# Patient Record
Sex: Male | Born: 1935 | Hispanic: Yes | Smoking: Former smoker
Health system: Southern US, Community
[De-identification: ages and names within clinical notes are randomized; demographics above are authoritative.]

## PROBLEM LIST (undated history)

## (undated) DIAGNOSIS — S88919A Complete traumatic amputation of unspecified lower leg, level unspecified, initial encounter: Secondary | ICD-10-CM

## (undated) DIAGNOSIS — I2510 Atherosclerotic heart disease of native coronary artery without angina pectoris: Secondary | ICD-10-CM

## (undated) DIAGNOSIS — I472 Ventricular tachycardia, unspecified: Secondary | ICD-10-CM

## (undated) DIAGNOSIS — J449 Chronic obstructive pulmonary disease, unspecified: Secondary | ICD-10-CM

## (undated) DIAGNOSIS — S68119A Complete traumatic metacarpophalangeal amputation of unspecified finger, initial encounter: Secondary | ICD-10-CM

## (undated) DIAGNOSIS — E785 Hyperlipidemia, unspecified: Secondary | ICD-10-CM

## (undated) DIAGNOSIS — Z95810 Presence of automatic (implantable) cardiac defibrillator: Secondary | ICD-10-CM

## (undated) DIAGNOSIS — R569 Unspecified convulsions: Secondary | ICD-10-CM

## (undated) DIAGNOSIS — E119 Type 2 diabetes mellitus without complications: Secondary | ICD-10-CM

## (undated) DIAGNOSIS — S065X9A Traumatic subdural hemorrhage with loss of consciousness of unspecified duration, initial encounter: Secondary | ICD-10-CM

## (undated) DIAGNOSIS — I739 Peripheral vascular disease, unspecified: Secondary | ICD-10-CM

## (undated) DIAGNOSIS — S065XAA Traumatic subdural hemorrhage with loss of consciousness status unknown, initial encounter: Secondary | ICD-10-CM

## (undated) DIAGNOSIS — I10 Essential (primary) hypertension: Secondary | ICD-10-CM

## (undated) DIAGNOSIS — I639 Cerebral infarction, unspecified: Secondary | ICD-10-CM

## (undated) DIAGNOSIS — Z9289 Personal history of other medical treatment: Secondary | ICD-10-CM

## (undated) DIAGNOSIS — I82409 Acute embolism and thrombosis of unspecified deep veins of unspecified lower extremity: Secondary | ICD-10-CM

## (undated) DIAGNOSIS — I5022 Chronic systolic (congestive) heart failure: Secondary | ICD-10-CM

## (undated) HISTORY — PX: LEG AMPUTATION BELOW KNEE: SHX694

## (undated) HISTORY — PX: BALLOON ANGIOPLASTY, ARTERY: SHX564

## (undated) HISTORY — PX: LEG AMPUTATION ABOVE KNEE: SHX117

## (undated) HISTORY — PX: CAROTID ENDARTERECTOMY: SUR193

## (undated) HISTORY — PX: EYE SURGERY: SHX253

---

## 1996-09-05 DIAGNOSIS — I639 Cerebral infarction, unspecified: Secondary | ICD-10-CM

## 1996-09-05 HISTORY — DX: Cerebral infarction, unspecified: I63.9

## 2002-09-05 HISTORY — PX: CORONARY ARTERY BYPASS GRAFT: SHX141

## 2005-09-05 HISTORY — PX: CARDIAC DEFIBRILLATOR PLACEMENT: SHX171

## 2010-08-01 ENCOUNTER — Ambulatory Visit: Payer: Self-pay | Admitting: Internal Medicine

## 2010-08-01 ENCOUNTER — Inpatient Hospital Stay (HOSPITAL_COMMUNITY): Admission: EM | Admit: 2010-08-01 | Discharge: 2010-08-04 | Payer: Self-pay | Admitting: Internal Medicine

## 2010-08-01 ENCOUNTER — Encounter: Payer: Self-pay | Admitting: Emergency Medicine

## 2010-08-01 ENCOUNTER — Ambulatory Visit: Payer: Self-pay | Admitting: Cardiology

## 2010-08-02 ENCOUNTER — Encounter: Payer: Self-pay | Admitting: Internal Medicine

## 2010-08-03 ENCOUNTER — Ambulatory Visit: Payer: Self-pay | Admitting: Physical Medicine & Rehabilitation

## 2010-08-03 ENCOUNTER — Encounter (INDEPENDENT_AMBULATORY_CARE_PROVIDER_SITE_OTHER): Payer: Self-pay | Admitting: Internal Medicine

## 2010-08-04 ENCOUNTER — Inpatient Hospital Stay (HOSPITAL_COMMUNITY)
Admission: RE | Admit: 2010-08-04 | Discharge: 2010-08-13 | Payer: Self-pay | Attending: Physical Medicine & Rehabilitation | Admitting: Physical Medicine & Rehabilitation

## 2010-09-21 ENCOUNTER — Encounter
Admission: RE | Admit: 2010-09-21 | Discharge: 2010-10-05 | Payer: Self-pay | Source: Home / Self Care | Attending: Physical Medicine & Rehabilitation | Admitting: Physical Medicine & Rehabilitation

## 2010-09-24 ENCOUNTER — Ambulatory Visit: Admit: 2010-09-24 | Payer: Self-pay | Admitting: Physical Medicine & Rehabilitation

## 2010-09-27 ENCOUNTER — Emergency Department (HOSPITAL_COMMUNITY)
Admission: EM | Admit: 2010-09-27 | Discharge: 2010-09-27 | Payer: Self-pay | Source: Home / Self Care | Admitting: Emergency Medicine

## 2010-09-28 LAB — CBC
Hemoglobin: 12.6 g/dL — ABNORMAL LOW (ref 13.0–17.0)
MCHC: 33.5 g/dL (ref 30.0–36.0)
RBC: 3.89 MIL/uL — ABNORMAL LOW (ref 4.22–5.81)
WBC: 10.7 10*3/uL — ABNORMAL HIGH (ref 4.0–10.5)

## 2010-09-28 LAB — DIFFERENTIAL
Basophils Absolute: 0 10*3/uL (ref 0.0–0.1)
Basophils Relative: 0 % (ref 0–1)
Monocytes Absolute: 1.1 10*3/uL — ABNORMAL HIGH (ref 0.1–1.0)
Neutro Abs: 7 10*3/uL (ref 1.7–7.7)
Neutrophils Relative %: 65 % (ref 43–77)

## 2010-09-28 LAB — DIGOXIN LEVEL: Digoxin Level: 0.5 ng/mL — ABNORMAL LOW (ref 0.8–2.0)

## 2010-09-28 LAB — GLUCOSE, CAPILLARY: Glucose-Capillary: 82 mg/dL (ref 70–99)

## 2010-09-28 LAB — POCT I-STAT, CHEM 8
HCT: 39 % (ref 39.0–52.0)
Hemoglobin: 13.3 g/dL (ref 13.0–17.0)
Potassium: 3.9 mEq/L (ref 3.5–5.1)
Sodium: 141 mEq/L (ref 135–145)
TCO2: 24 mmol/L (ref 0–100)

## 2010-09-28 LAB — SEDIMENTATION RATE: Sed Rate: 29 mm/hr — ABNORMAL HIGH (ref 0–16)

## 2010-10-23 ENCOUNTER — Emergency Department (HOSPITAL_COMMUNITY): Payer: Medicare Other

## 2010-10-23 ENCOUNTER — Emergency Department (HOSPITAL_COMMUNITY)
Admission: EM | Admit: 2010-10-23 | Discharge: 2010-10-23 | Disposition: A | Discharge status: Home / Self Care | Payer: Medicare Other | Attending: Emergency Medicine | Admitting: Emergency Medicine

## 2010-10-23 DIAGNOSIS — R0602 Shortness of breath: Secondary | ICD-10-CM | POA: Insufficient documentation

## 2010-10-23 DIAGNOSIS — I517 Cardiomegaly: Secondary | ICD-10-CM | POA: Insufficient documentation

## 2010-10-23 DIAGNOSIS — R0989 Other specified symptoms and signs involving the circulatory and respiratory systems: Secondary | ICD-10-CM | POA: Insufficient documentation

## 2010-10-23 DIAGNOSIS — J40 Bronchitis, not specified as acute or chronic: Secondary | ICD-10-CM | POA: Insufficient documentation

## 2010-10-23 DIAGNOSIS — R05 Cough: Secondary | ICD-10-CM | POA: Insufficient documentation

## 2010-10-23 DIAGNOSIS — J449 Chronic obstructive pulmonary disease, unspecified: Secondary | ICD-10-CM | POA: Insufficient documentation

## 2010-10-23 DIAGNOSIS — I447 Left bundle-branch block, unspecified: Secondary | ICD-10-CM | POA: Insufficient documentation

## 2010-10-23 DIAGNOSIS — R059 Cough, unspecified: Secondary | ICD-10-CM | POA: Insufficient documentation

## 2010-10-23 DIAGNOSIS — J4489 Other specified chronic obstructive pulmonary disease: Secondary | ICD-10-CM | POA: Insufficient documentation

## 2010-10-23 LAB — CBC
Hemoglobin: 13.2 g/dL (ref 13.0–17.0)
MCHC: 33.8 g/dL (ref 30.0–36.0)
RDW: 13 % (ref 11.5–15.5)

## 2010-10-23 LAB — DIFFERENTIAL
Basophils Absolute: 0 10*3/uL (ref 0.0–0.1)
Basophils Relative: 0 % (ref 0–1)
Monocytes Absolute: 1.4 10*3/uL — ABNORMAL HIGH (ref 0.1–1.0)
Neutro Abs: 10.2 10*3/uL — ABNORMAL HIGH (ref 1.7–7.7)

## 2010-10-23 LAB — BASIC METABOLIC PANEL
Calcium: 9.5 mg/dL (ref 8.4–10.5)
GFR calc Af Amer: >60 mL/min (ref >60)
GFR calc non Af Amer: >60 mL/min (ref >60)
Sodium: 140 mEq/L (ref 135–145)

## 2010-10-26 ENCOUNTER — Inpatient Hospital Stay (HOSPITAL_COMMUNITY)
Admission: EM | Admit: 2010-10-26 | Discharge: 2010-11-02 | DRG: 194 | Disposition: A | Discharge status: Skilled Nursing Facility | Payer: Medicare Other | Attending: Internal Medicine | Admitting: Internal Medicine

## 2010-10-26 ENCOUNTER — Emergency Department (HOSPITAL_COMMUNITY): Payer: Medicare Other

## 2010-10-26 DIAGNOSIS — I2589 Other forms of chronic ischemic heart disease: Secondary | ICD-10-CM | POA: Diagnosis present

## 2010-10-26 DIAGNOSIS — S68118A Complete traumatic metacarpophalangeal amputation of other finger, initial encounter: Secondary | ICD-10-CM

## 2010-10-26 DIAGNOSIS — Z79899 Other long term (current) drug therapy: Secondary | ICD-10-CM

## 2010-10-26 DIAGNOSIS — I2510 Atherosclerotic heart disease of native coronary artery without angina pectoris: Secondary | ICD-10-CM | POA: Diagnosis present

## 2010-10-26 DIAGNOSIS — Z8673 Personal history of transient ischemic attack (TIA), and cerebral infarction without residual deficits: Secondary | ICD-10-CM

## 2010-10-26 DIAGNOSIS — L97409 Non-pressure chronic ulcer of unspecified heel and midfoot with unspecified severity: Secondary | ICD-10-CM | POA: Diagnosis present

## 2010-10-26 DIAGNOSIS — I70269 Atherosclerosis of native arteries of extremities with gangrene, unspecified extremity: Secondary | ICD-10-CM | POA: Diagnosis present

## 2010-10-26 DIAGNOSIS — J189 Pneumonia, unspecified organism: Principal | ICD-10-CM | POA: Diagnosis present

## 2010-10-26 DIAGNOSIS — I4891 Unspecified atrial fibrillation: Secondary | ICD-10-CM | POA: Diagnosis present

## 2010-10-26 DIAGNOSIS — E871 Hypo-osmolality and hyponatremia: Secondary | ICD-10-CM | POA: Diagnosis not present

## 2010-10-26 DIAGNOSIS — E1159 Type 2 diabetes mellitus with other circulatory complications: Secondary | ICD-10-CM | POA: Diagnosis present

## 2010-10-26 DIAGNOSIS — R627 Adult failure to thrive: Secondary | ICD-10-CM | POA: Diagnosis present

## 2010-10-26 DIAGNOSIS — S88119A Complete traumatic amputation at level between knee and ankle, unspecified lower leg, initial encounter: Secondary | ICD-10-CM

## 2010-10-26 DIAGNOSIS — Z7902 Long term (current) use of antithrombotics/antiplatelets: Secondary | ICD-10-CM

## 2010-10-26 DIAGNOSIS — I10 Essential (primary) hypertension: Secondary | ICD-10-CM | POA: Diagnosis present

## 2010-10-26 DIAGNOSIS — R5381 Other malaise: Secondary | ICD-10-CM | POA: Diagnosis present

## 2010-10-26 DIAGNOSIS — Z950 Presence of cardiac pacemaker: Secondary | ICD-10-CM

## 2010-10-26 DIAGNOSIS — I252 Old myocardial infarction: Secondary | ICD-10-CM

## 2010-10-26 DIAGNOSIS — Z951 Presence of aortocoronary bypass graft: Secondary | ICD-10-CM

## 2010-10-26 DIAGNOSIS — Z87820 Personal history of traumatic brain injury: Secondary | ICD-10-CM

## 2010-10-26 LAB — POCT CARDIAC MARKERS
CKMB, poc: 4.6 ng/mL (ref 1.0–8.0)
Myoglobin, poc: 217 ng/mL (ref 12–200)

## 2010-10-26 LAB — POCT I-STAT, CHEM 8
HCT: 39 % (ref 39.0–52.0)
Hemoglobin: 13.3 g/dL (ref 13.0–17.0)
Potassium: 4.3 mEq/L (ref 3.5–5.1)
Sodium: 139 mEq/L (ref 135–145)

## 2010-10-26 LAB — URINALYSIS, ROUTINE W REFLEX MICROSCOPIC
Hgb urine dipstick: NEGATIVE
Ketones, ur: NEGATIVE mg/dL
Protein, ur: NEGATIVE mg/dL
Urobilinogen, UA: 0.2 mg/dL (ref 0.0–1.0)

## 2010-10-26 LAB — CBC
HCT: 36.7 % — ABNORMAL LOW (ref 39.0–52.0)
MCHC: 33.2 g/dL (ref 30.0–36.0)
RDW: 12.9 % (ref 11.5–15.5)

## 2010-10-26 LAB — DIFFERENTIAL
Basophils Absolute: 0 10*3/uL (ref 0.0–0.1)
Basophils Relative: 0 % (ref 0–1)
Eosinophils Relative: 2 % (ref 0–5)
Monocytes Absolute: 1.8 10*3/uL — ABNORMAL HIGH (ref 0.1–1.0)

## 2010-10-27 LAB — GLUCOSE, CAPILLARY: Glucose-Capillary: 109 mg/dL — ABNORMAL HIGH (ref 70–99)

## 2010-10-28 ENCOUNTER — Inpatient Hospital Stay (HOSPITAL_COMMUNITY): Payer: Medicare Other

## 2010-10-28 LAB — GLUCOSE, CAPILLARY
Glucose-Capillary: 108 mg/dL — ABNORMAL HIGH (ref 70–99)
Glucose-Capillary: 117 mg/dL — ABNORMAL HIGH (ref 70–99)
Glucose-Capillary: 135 mg/dL — ABNORMAL HIGH (ref 70–99)
Glucose-Capillary: 150 mg/dL — ABNORMAL HIGH (ref 70–99)
Glucose-Capillary: 158 mg/dL — ABNORMAL HIGH (ref 70–99)

## 2010-10-28 LAB — MAGNESIUM: Magnesium: 1.9 mg/dL (ref 1.5–2.5)

## 2010-10-28 LAB — CBC
Hemoglobin: 11.9 g/dL — ABNORMAL LOW (ref 13.0–17.0)
MCH: 32.1 pg (ref 26.0–34.0)
MCV: 96.2 fL (ref 78.0–100.0)
Platelets: 314 10*3/uL (ref 150–400)
RBC: 3.71 MIL/uL — ABNORMAL LOW (ref 4.22–5.81)
WBC: 17.2 10*3/uL — ABNORMAL HIGH (ref 4.0–10.5)

## 2010-10-28 LAB — BASIC METABOLIC PANEL
BUN: 14 mg/dL (ref 6–23)
CO2: 27 mEq/L (ref 19–32)
Chloride: 102 mEq/L (ref 96–112)
Creatinine, Ser: 1.08 mg/dL (ref 0.4–1.5)
Potassium: 4.2 mEq/L (ref 3.5–5.1)

## 2010-10-28 LAB — DIGOXIN LEVEL: Digoxin Level: 0.4 ng/mL — ABNORMAL LOW (ref 0.8–2.0)

## 2010-10-29 ENCOUNTER — Inpatient Hospital Stay (HOSPITAL_COMMUNITY): Payer: Medicare Other

## 2010-10-29 ENCOUNTER — Encounter (HOSPITAL_COMMUNITY): Payer: Medicare Other

## 2010-10-29 LAB — CBC
MCH: 32.1 pg (ref 26.0–34.0)
MCHC: 34.1 g/dL (ref 30.0–36.0)
MCV: 94.1 fL (ref 78.0–100.0)
Platelets: 260 10*3/uL (ref 150–400)
RDW: 12.8 % (ref 11.5–15.5)

## 2010-10-29 LAB — GLUCOSE, CAPILLARY
Glucose-Capillary: 164 mg/dL — ABNORMAL HIGH (ref 70–99)
Glucose-Capillary: 120 mg/dL — ABNORMAL HIGH (ref 70–99)
Glucose-Capillary: 233 mg/dL — ABNORMAL HIGH (ref 70–99)

## 2010-10-29 LAB — COMPREHENSIVE METABOLIC PANEL
Albumin: 2.5 g/dL — ABNORMAL LOW (ref 3.5–5.2)
BUN: 17 mg/dL (ref 6–23)
Calcium: 8.6 mg/dL (ref 8.4–10.5)
Creatinine, Ser: 1.3 mg/dL (ref 0.4–1.5)
GFR calc Af Amer: >60 mL/min (ref >60)
Total Bilirubin: 0.6 mg/dL (ref 0.3–1.2)
Total Protein: 6.6 g/dL (ref 6.0–8.3)

## 2010-10-29 MED ORDER — TECHNETIUM TC 99M MEDRONATE IV KIT
25.0000 | PACK | Freq: Once | INTRAVENOUS | Status: AC | PRN
  Administered 2010-10-29: 27 via INTRAVENOUS

## 2010-10-30 LAB — CBC
HCT: 30.4 % — ABNORMAL LOW (ref 39.0–52.0)
Platelets: 254 10*3/uL (ref 150–400)
RBC: 3.2 MIL/uL — ABNORMAL LOW (ref 4.22–5.81)
RDW: 12.9 % (ref 11.5–15.5)

## 2010-10-30 LAB — GLUCOSE, CAPILLARY: Glucose-Capillary: 125 mg/dL — ABNORMAL HIGH (ref 70–99)

## 2010-10-30 LAB — BASIC METABOLIC PANEL
GFR calc non Af Amer: 58 mL/min — ABNORMAL LOW (ref >60)
Potassium: 4 mEq/L (ref 3.5–5.1)
Sodium: 136 mEq/L (ref 135–145)

## 2010-10-30 LAB — MAGNESIUM: Magnesium: 2.3 mg/dL (ref 1.5–2.5)

## 2010-10-31 LAB — GLUCOSE, CAPILLARY
Glucose-Capillary: 120 mg/dL — ABNORMAL HIGH (ref 70–99)
Glucose-Capillary: 238 mg/dL — ABNORMAL HIGH (ref 70–99)
Glucose-Capillary: 114 mg/dL — ABNORMAL HIGH (ref 70–99)
Glucose-Capillary: 144 mg/dL — ABNORMAL HIGH (ref 70–99)

## 2010-10-31 LAB — CBC
HCT: 31.1 % — ABNORMAL LOW (ref 39.0–52.0)
Hemoglobin: 10.4 g/dL — ABNORMAL LOW (ref 13.0–17.0)
MCH: 32.2 pg (ref 26.0–34.0)
MCHC: 33.4 g/dL (ref 30.0–36.0)
MCV: 96.3 fL (ref 78.0–100.0)
Platelets: 287 10*3/uL (ref 150–400)
RBC: 3.23 MIL/uL — ABNORMAL LOW (ref 4.22–5.81)
RDW: 12.8 % (ref 11.5–15.5)
WBC: 15.3 10*3/uL — ABNORMAL HIGH (ref 4.0–10.5)

## 2010-10-31 LAB — BASIC METABOLIC PANEL
BUN: 13 mg/dL (ref 6–23)
Chloride: 98 mEq/L (ref 96–112)
Glucose, Bld: 128 mg/dL — ABNORMAL HIGH (ref 70–99)
Potassium: 4 mEq/L (ref 3.5–5.1)

## 2010-11-01 ENCOUNTER — Encounter (HOSPITAL_COMMUNITY): Payer: Medicare Other

## 2010-11-01 ENCOUNTER — Other Ambulatory Visit (HOSPITAL_COMMUNITY): Payer: Medicare Other

## 2010-11-01 LAB — BASIC METABOLIC PANEL
BUN: 18 mg/dL (ref 6–23)
Chloride: 99 mEq/L (ref 96–112)
Creatinine, Ser: 1.28 mg/dL (ref 0.4–1.5)
Glucose, Bld: 106 mg/dL — ABNORMAL HIGH (ref 70–99)

## 2010-11-01 LAB — CBC
MCH: 30.7 pg (ref 26.0–34.0)
MCV: 95.8 fL (ref 78.0–100.0)
Platelets: 305 10*3/uL (ref 150–400)
RBC: 3.32 MIL/uL — ABNORMAL LOW (ref 4.22–5.81)

## 2010-11-01 LAB — GLUCOSE, CAPILLARY
Glucose-Capillary: 111 mg/dL — ABNORMAL HIGH (ref 70–99)
Glucose-Capillary: 242 mg/dL — ABNORMAL HIGH (ref 70–99)

## 2010-11-02 LAB — CULTURE, BLOOD (ROUTINE X 2)
Culture  Setup Time: 201202221413
Culture: NO GROWTH

## 2010-11-02 LAB — GLUCOSE, CAPILLARY
Glucose-Capillary: 112 mg/dL — ABNORMAL HIGH (ref 70–99)
Glucose-Capillary: 165 mg/dL — ABNORMAL HIGH (ref 70–99)

## 2010-11-02 NOTE — Discharge Summary (Signed)
NAMEJERRE, VANDRUNEN NO.:  [id number]  MEDICAL RECORD NO.:  [id number]           PATIENT TYPE:  I  LOCATION:  3012                         FACILITY:  [facility]  PHYSICIAN:  Andreas Blower, MD       DATE OF BIRTH:  01/05/1936  DATE OF ADMISSION:  10/26/2010 DATE OF DISCHARGE:                        DISCHARGE SUMMARY - REFERRING   PRIMARY CARE PHYSICIAN:  Doreatha Martin, MD.  VASCULAR SURGEON:  Dr. Meredeth Ide with [facility] in [facility].  CARDIOLOGIST:  [facility].  DISCHARGE DIAGNOSES: 1. Presumed early pneumonia. 2. Right heel ulcers/dry gangrene. 3. Type 2 diabetes. 4. Ischemic cardiomyopathy, EF of 30% to 35%. 5. Hypertension. 6. Recent history of left subdural hemorrhage. 7. History of coronary artery disease. 8. History of left below-the-knee amputation. 9. History of cerebrovascular accident. 10.Leukocytosis. 11.Mild dehydration. 12.Weakness. 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 14.History of CABG in 1999. 15.Status post left below-the-knee amputation. 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 17.Bilateral CEA in 1998, in [place]. 18.History of urinary tract infection. 19.History of atrial fibrillation, status post pacemaker placement.  DISCHARGE MEDICATIONS: 1. Cefuroxime 500 mg p.o. twice a day for two more days. 2. Guaifenesin 600 mg LA p.o. twice daily for 10 days. 3. Guaifenesin/DM 100/10 mg for 5 mL every 6 hours as needed for     cough. 4. Oxycodone 5 mg IR every 6 hours as needed for pain.  The patient is     given total of 15 tablets. 5. Acetaminophen 650 mg every 4 hours as needed. 6. Antiseptic rinse 10 mL twice daily. 7. Carvedilol 25 mg p.o. twice daily. 8. Digoxin 0.125 mg p.o. daily. 9. Enalapril 5 mg p.o. twice daily. 10.Fluticasone HFA 220 mcg 1 puff inhaled twice daily. 11.Gabapentin 100 mg p.o. twice daily. 12.Metformin 5 mg p.o. daily. 13.Multivitamin 1 tablet  daily. 14.Plavix 75 mg p.o. daily. 15.Rosuvastatin 40 mg daily at bedtime. 16.Spironolactone 50 mg p.o. daily. 17.Zetia 10 mg p.o. daily.  BRIEF ADMITTING HISTORY AND PHYSICAL:  Mr. Reine is a 75 year old gentleman with history of CVA 12 years ago who was brought in to the emergency department on October 28, 2010 with worsening shortness of breath, weakness and chest congestion.  RADIOLOGY/IMAGING:  The patient had chest x-ray two-view on October 26, 2010, which showed stable cardiomegaly, postoperative changes.  No acute disease.  The patient had portable chest x-ray on October 28, 2010, which shows no acute cardiopulmonary abnormality.  The patient had x-ray of the right foot three-view, which showed soft tissue defect of the posterior capsule calcaneus suggesting ulcer.  No underlying bone changes to suggest osteomyelitis.  No acute fracture or subluxation.  The patient had an MRI of the bone three-phase, which was negative for osteomyelitis of the calcaneus.  LABORATORY DATA:  CBC shows a white count of 13.7, hemoglobin 10.3, hematocrit 31.8, platelet count 305.  Electrolytes normal with a creatinine of 1.28, except sodium is 134.  Liver function tests normal except alk phos of 38, albumin is 2.8.  BNP is 360, magnesium is 2.3. UA on admission was negative for nitrites and leukocytes.  Blood cultures  x2 on admission showed no growth to date.  HOSPITAL COURSE BY PROBLEM: 1. Cough, shortness of breath, it was thought to be from presumed     early pneumonia.  The patient was initially started on vancomycin     and Zosyn broad-spectrum antibiotics during the course of hospital     stay.  However, his antibiotics were transitioned to cefuroxime and     azithromycin.  The patient completed 5-day course of azithromycin.     He will continue to take cefuroxime for two more days to complete     that 8-day course of antibiotics.  During the course of hospital     stay, his  shortness of breath improved.  His shortness of breath     may also be due to chronic systolic dysfunction.  Shortness of     breath may also have been complicated by chronic diastolic     dysfunction.  It is uncertain why his spironolactone was initially     held on admission; however, the patient was given a small dose of     IV Lasix one-time during the course of hospital stay.  His     breathing improved with antibiotics and one dose of Lasix.  The     patient at the time of discharge will be resumed on spironolactone.     Resumed on spironolactone at the time of discharge. 2. Right heel ulcer, dry gangrene.  Had x-ray and bone scan which     shows no osteomyelitis.  Had an extensive discussion with Dr.     Meredeth Ide, his surgeon at [facility], who has been managing his     right heel ulcer who indicated that he will see the patient in his     office given there is no osteomyelitis. 3. Diabetes type 2.  Blood sugar stable during the course of hospital     stay.  We will resume metformin at the time of discharge. 4. Ischemic cardiomyopathy with ejection fraction of 30% to 35%.  The     patient initially was started on Lasix, was given one dose where at     the time of discharge will be on spironolactone.  Further titration     of diuretics as outpatient. 5. Hypertension.  Blood pressure was stable during the course of     hospital stay. 6. Recent history of left subdural hemorrhage.  Avoid any heparin     products during the course of hospital stay. 7. History of coronary artery disease, stable.  Continue the patient     on home medications. 8. Left BKA. 9. History of CVA, stable. 10.Deconditioning.  The patient was evaluated by physical therapy who     recommended Home Health PT.  DISPOSITION:  Pending with the patient going home with Home Health PT versus the family is contemplating about having the patient to go to possible skilled nursing facility.  Time spent on discharge  talking to the patient, coordinating care and talking to the family was 40 minutes.     Andreas Blower, MD     SR/MEDQ  D:  11/01/2010  T:  11/01/2010  Job:  [id number]  cc:   Doreatha Martin, M.D. Dr. Meredeth Ide  Electronically Signed by Wardell Heath Jouri Threat  on 11/02/2010 09:21:04 PM

## 2010-11-08 NOTE — H&P (Signed)
NAMEJAIVYN, GULLA               ACCOUNT NO.:  [id number]  MEDICAL RECORD NO.:  [id number]           PATIENT TYPE:  I  LOCATION:  3012                         FACILITY:  [facility]  PHYSICIAN:  Della Goo, M.D. DATE OF BIRTH:  1935/11/14  DATE OF ADMISSION:  10/27/2010 DATE OF DISCHARGE:                             HISTORY & PHYSICAL   ADMISSION DATE:  October 27, 2010.  PRIMARY CARE PHYSICIAN:  Doreatha Martin, MD  CHIEF COMPLAINT:  Shortness of breath, weakness, and chest congestion.  HISTORY OF PRESENT ILLNESS:  This is a 75 year old male with a history of a cerebrovascular accident 12 years ago, who was brought to the emergency department emergently secondary to worsening shortness of breath, weakness, chest congestion.  The patient's family gave the history, the patient is unable to secondary to stroke symptoms.  The patient reportedly had been worse today.  He also had a recent surgery on his left lower extremity, a vascular stent placement in the lower extremity secondary to peripheral vascular disease of which the patient's family report has failed and patient has had continued pain. He has developed a stasis ulcer on the right heel which has been present for the past month.  The patient is also complaining of having worsening pain in the right lower extremity.  The patient was seen 2 days ago in the emergency department secondary to the same symptoms.  However, continued to worsen. When he was seen at that time, he was diagnosed with bronchitis.  When the patient returned with worsening symptoms this evening, the patient was felt to have a clinical pneumonia.  His white blood cell count was more elevated and found to be 16.7.  The patient was started empirically on antibiotic therapy to cover a community-acquired pneumonia with IV Rocephin and azithromycin.  The patient was referred for medical admission.  PAST MEDICAL HISTORY:  Significant for cerebrovascular  accident, congestive heart failure syndrome, coronary artery disease, peripheral vascular disease, status post pacemaker placement, atrial fibrillation, and hyperlipidemia.  PAST SURGICAL HISTORY:  History of a left below-knee amputation, femoral popliteal stent placement, bilateral carotid endarterectomies, coronary artery bypass grafting.  Medications at this time include carvedilol, Crestor, enalapril, Flovent, gabapentin, metformin, Plavix, spironolactone, Zetia.  ALLERGIES:  No known drug allergies.  SOCIAL HISTORY:  The patient lives with his family.  He is a nonsmoker, nondrinker.  No history of illicit drug usage.  FAMILY HISTORY:  Noncontributory.  REVIEW OF SYSTEMS:  Pertinent as mentioned above.  PHYSICAL EXAMINATION FINDINGS:  GENERAL:  This is a 75 year old thin, well-developed male who is in no acute distress. VITAL SIGNS:  Temperature 98.8 orally, blood pressure 120/52, heart rate 82, respirations 18, O2 sats 97-98%. HEENT:  Normocephalic, atraumatic.  Pupils equal, round, and reactive to light.  Extraocular movements are intact.  Funduscopic benign.  There is no scleral icterus.  Nares are patent bilaterally.  Oropharynx is clear. NECK:  Supple.  Full range of motion.  No thyromegaly, adenopathy, or jugular venous distention. CARDIOVASCULAR:  Regular rate and rhythm.  No murmurs, gallops, or rubs appreciated. LUNGS:  With decreased breath sounds bilaterally, unlabored breathing, normal excursion.  Diffuse rhonchi present.  No rales.  No wheezes. ABDOMEN:  Positive bowel sounds, soft, nontender, nondistended.  No hepatosplenomegaly. EXTREMITIES:  A left below-knee amputation present.  There is a small pressure area on the anterior aspect of the BKA stump.  There are no signs of infection and has a clean base, this area is a size of a dime. The right lower extremity is without cyanosis, clubbing, or edema.  The dorsal pedal pulses is 1- and the patient has a  quarter-sized stasis ulcer on the posterior aspect of the calcaneus.  This area appears to be a stage II, if it is stageable.  The base of the wound is black. NEUROLOGIC:  Neurologic changes, the patient has mild left-sided weakness which is chronic and has aphasia.  LABORATORY STUDIES:  White blood cell count 16.7, hemoglobin 12.2, hematocrit 36.7, MCV 96.8, platelets 337, neutrophils 76%, lymphocytes 12%.  Urinalysis negative.  Sodium 139, potassium 4.3, chloride 104, CO2 of 27, BUN 30, creatinine 1.1, and glucose 125.  Chest x-ray reveals stable cardiomegaly, postop changes.  No acute disease findings.  ASSESSMENT:  A 75 year old male being admitted with: 1. Shortness of breath. 2. Early pneumonia. 3. Leukocytosis. 4. Mild dehydration. 5. Weakness. 6. Coronary artery disease. 7. Type 2 diabetes mellitus. 8. Previous cerebrovascular accident.  PLAN:  The patient will be admitted.  He has been placed on antibiotic therapy of Rocephin and azithromycin at this time.  Blood cultures x2 have been ordered as well.  The patient will be placed on nebulizer treatments and will be administered Mucinex therapy and Robitussin therapy as needed.  Supplemental oxygen will also be ordered.  The patient's regular medications have been reviewed and the patient's medications will be continued except for the metformin therapy at this time.  Gentle rehydration therapy has been ordered.  A sliding scale insulin coverage will be ordered.  The patient is a full code at this time.     Della Goo, M.D.    HJ/MEDQ  D:  10/28/2010  T:  10/28/2010  Job:  [id number]  Electronically Signed by Della Goo M.D. on 11/08/2010 08:25:45 PM

## 2010-11-09 ENCOUNTER — Inpatient Hospital Stay: Payer: Medicare Other | Admitting: Physical Medicine & Rehabilitation

## 2010-11-09 ENCOUNTER — Encounter: Payer: Medicare Other | Attending: Physical Medicine & Rehabilitation

## 2010-11-16 LAB — CBC
HCT: 39.2 % (ref 39.0–52.0)
HCT: 39.4 % (ref 39.0–52.0)
Hemoglobin: 13 g/dL (ref 13.0–17.0)
Hemoglobin: 13.3 g/dL (ref 13.0–17.0)
MCH: 32.8 pg (ref 26.0–34.0)
MCHC: 33.2 g/dL (ref 30.0–36.0)
MCV: 97.3 fL (ref 78.0–100.0)
MCV: 97.6 fL (ref 78.0–100.0)
Platelets: 232 10*3/uL (ref 150–400)
Platelets: 258 10*3/uL (ref 150–400)
RBC: 4.05 MIL/uL — ABNORMAL LOW (ref 4.22–5.81)
RBC: 4.17 MIL/uL — ABNORMAL LOW (ref 4.22–5.81)
RDW: 13.2 % (ref 11.5–15.5)
WBC: 10 10*3/uL (ref 4.0–10.5)
WBC: 12.5 10*3/uL — ABNORMAL HIGH (ref 4.0–10.5)

## 2010-11-16 LAB — GLUCOSE, CAPILLARY
Glucose-Capillary: 103 mg/dL — ABNORMAL HIGH (ref 70–99)
Glucose-Capillary: 106 mg/dL — ABNORMAL HIGH (ref 70–99)
Glucose-Capillary: 109 mg/dL — ABNORMAL HIGH (ref 70–99)
Glucose-Capillary: 111 mg/dL — ABNORMAL HIGH (ref 70–99)
Glucose-Capillary: 113 mg/dL — ABNORMAL HIGH (ref 70–99)
Glucose-Capillary: 114 mg/dL — ABNORMAL HIGH (ref 70–99)
Glucose-Capillary: 122 mg/dL — ABNORMAL HIGH (ref 70–99)
Glucose-Capillary: 130 mg/dL — ABNORMAL HIGH (ref 70–99)
Glucose-Capillary: 133 mg/dL — ABNORMAL HIGH (ref 70–99)
Glucose-Capillary: 140 mg/dL — ABNORMAL HIGH (ref 70–99)
Glucose-Capillary: 148 mg/dL — ABNORMAL HIGH (ref 70–99)
Glucose-Capillary: 150 mg/dL — ABNORMAL HIGH (ref 70–99)
Glucose-Capillary: 154 mg/dL — ABNORMAL HIGH (ref 70–99)
Glucose-Capillary: 171 mg/dL — ABNORMAL HIGH (ref 70–99)
Glucose-Capillary: 179 mg/dL — ABNORMAL HIGH (ref 70–99)
Glucose-Capillary: 211 mg/dL — ABNORMAL HIGH (ref 70–99)
Glucose-Capillary: 76 mg/dL (ref 70–99)
Glucose-Capillary: 90 mg/dL (ref 70–99)
Glucose-Capillary: 90 mg/dL (ref 70–99)
Glucose-Capillary: 92 mg/dL (ref 70–99)
Glucose-Capillary: 98 mg/dL (ref 70–99)
Glucose-Capillary: 98 mg/dL (ref 70–99)
Glucose-Capillary: 98 mg/dL (ref 70–99)

## 2010-11-16 LAB — URINALYSIS, MICROSCOPIC ONLY
Bilirubin Urine: NEGATIVE
Glucose, UA: NEGATIVE mg/dL
Hgb urine dipstick: NEGATIVE
Ketones, ur: NEGATIVE mg/dL
Nitrite: NEGATIVE
Protein, ur: NEGATIVE mg/dL
Specific Gravity, Urine: 1.013 (ref 1.005–1.030)
Urobilinogen, UA: 0.2 mg/dL (ref 0.0–1.0)
pH: 5.5 (ref 5.0–8.0)

## 2010-11-16 LAB — DIFFERENTIAL
Basophils Absolute: 0 10*3/uL (ref 0.0–0.1)
Basophils Relative: 0 % (ref 0–1)
Eosinophils Relative: 3 % (ref 0–5)
Monocytes Absolute: 1.2 10*3/uL — ABNORMAL HIGH (ref 0.1–1.0)
Monocytes Relative: 10 % (ref 3–12)

## 2010-11-16 LAB — COMPREHENSIVE METABOLIC PANEL
AST: 19 U/L (ref 0–37)
Albumin: 3.5 g/dL (ref 3.5–5.2)
Alkaline Phosphatase: 55 U/L (ref 39–117)
Chloride: 105 mEq/L (ref 96–112)
GFR calc Af Amer: >60 mL/min (ref >60)
Potassium: 4.1 mEq/L (ref 3.5–5.1)
Sodium: 138 mEq/L (ref 135–145)
Total Bilirubin: 0.7 mg/dL (ref 0.3–1.2)
Total Protein: 7.8 g/dL (ref 6.0–8.3)

## 2010-11-16 LAB — URINE CULTURE: Culture: NO GROWTH

## 2010-11-17 LAB — URINALYSIS, ROUTINE W REFLEX MICROSCOPIC
Bilirubin Urine: NEGATIVE
Glucose, UA: NEGATIVE mg/dL
Ketones, ur: NEGATIVE mg/dL
Protein, ur: NEGATIVE mg/dL
pH: 6 (ref 5.0–8.0)

## 2010-11-17 LAB — CBC
Hemoglobin: 13.3 g/dL (ref 13.0–17.0)
MCH: 33.5 pg (ref 26.0–34.0)
Platelets: 228 10*3/uL (ref 150–400)
Platelets: 231 10*3/uL (ref 150–400)
RBC: 3.98 MIL/uL — ABNORMAL LOW (ref 4.22–5.81)
RBC: 4.28 MIL/uL (ref 4.22–5.81)
WBC: 10.6 10*3/uL — ABNORMAL HIGH (ref 4.0–10.5)
WBC: 9.1 10*3/uL (ref 4.0–10.5)

## 2010-11-17 LAB — COMPREHENSIVE METABOLIC PANEL
ALT: 11 U/L (ref 0–53)
ALT: 12 U/L (ref 0–53)
AST: 20 U/L (ref 0–37)
AST: 23 U/L (ref 0–37)
Albumin: 3.5 g/dL (ref 3.5–5.2)
Albumin: 3.6 g/dL (ref 3.5–5.2)
Alkaline Phosphatase: 49 U/L (ref 39–117)
CO2: 26 mEq/L (ref 19–32)
Chloride: 106 mEq/L (ref 96–112)
Chloride: 108 mEq/L (ref 96–112)
Creatinine, Ser: 1.04 mg/dL (ref 0.4–1.5)
Creatinine, Ser: 1.25 mg/dL (ref 0.4–1.5)
GFR calc Af Amer: >60 mL/min (ref >60)
GFR calc Af Amer: >60 mL/min (ref >60)
GFR calc non Af Amer: 57 mL/min — ABNORMAL LOW (ref >60)
Potassium: 3.5 mEq/L (ref 3.5–5.1)
Potassium: 3.6 mEq/L (ref 3.5–5.1)
Sodium: 137 mEq/L (ref 135–145)
Sodium: 139 mEq/L (ref 135–145)
Total Bilirubin: 0.6 mg/dL (ref 0.3–1.2)
Total Bilirubin: 0.6 mg/dL (ref 0.3–1.2)

## 2010-11-17 LAB — URINE MICROSCOPIC-ADD ON

## 2010-11-17 LAB — DIFFERENTIAL
Basophils Absolute: 0 10*3/uL (ref 0.0–0.1)
Eosinophils Absolute: 0.2 10*3/uL (ref 0.0–0.7)
Eosinophils Relative: 2 % (ref 0–5)
Lymphocytes Relative: 14 % (ref 12–46)
Monocytes Absolute: 1.1 10*3/uL — ABNORMAL HIGH (ref 0.1–1.0)

## 2010-11-17 LAB — APTT: aPTT: 38 seconds — ABNORMAL HIGH (ref 24–37)

## 2010-11-17 LAB — GLUCOSE, CAPILLARY
Glucose-Capillary: 103 mg/dL — ABNORMAL HIGH (ref 70–99)
Glucose-Capillary: 106 mg/dL — ABNORMAL HIGH (ref 70–99)
Glucose-Capillary: 112 mg/dL — ABNORMAL HIGH (ref 70–99)
Glucose-Capillary: 127 mg/dL — ABNORMAL HIGH (ref 70–99)
Glucose-Capillary: 169 mg/dL — ABNORMAL HIGH (ref 70–99)
Glucose-Capillary: 175 mg/dL — ABNORMAL HIGH (ref 70–99)
Glucose-Capillary: 180 mg/dL — ABNORMAL HIGH (ref 70–99)

## 2010-11-17 LAB — URINE CULTURE
Colony Count: NO GROWTH
Culture  Setup Time: 201111282316
Culture: NO GROWTH

## 2010-11-17 LAB — CARDIAC PANEL(CRET KIN+CKTOT+MB+TROPI)
CK, MB: 7.1 ng/mL (ref 0.3–4.0)
Relative Index: 3.6 — ABNORMAL HIGH (ref 0.0–2.5)
Relative Index: 3.8 — ABNORMAL HIGH (ref 0.0–2.5)
Total CK: 188 U/L (ref 7–232)

## 2010-11-17 LAB — LIPID PANEL
Cholesterol: 167 mg/dL (ref 0–200)
HDL: 38 mg/dL — ABNORMAL LOW (ref >39)
Total CHOL/HDL Ratio: 4.4 RATIO
Triglycerides: 85 mg/dL (ref <150)

## 2010-11-17 LAB — CK TOTAL AND CKMB (NOT AT ARMC)
Relative Index: 3.5 — ABNORMAL HIGH (ref 0.0–2.5)
Total CK: 226 U/L (ref 7–232)

## 2010-11-17 LAB — MRSA PCR SCREENING: MRSA by PCR: NEGATIVE

## 2010-11-17 LAB — TROPONIN I: Troponin I: 0.13 ng/mL — ABNORMAL HIGH (ref 0.00–0.06)

## 2011-05-11 IMAGING — CR DG FOOT COMPLETE 3+V*R*
3 series · 3 of 3 positions shown · non-contrast
Comparison: 09/27/2010

CLINICAL DATA: Osteomyelitis

RIGHT FOOT COMPLETE - 3+ VIEW

[view not recorded (1 of 3)]
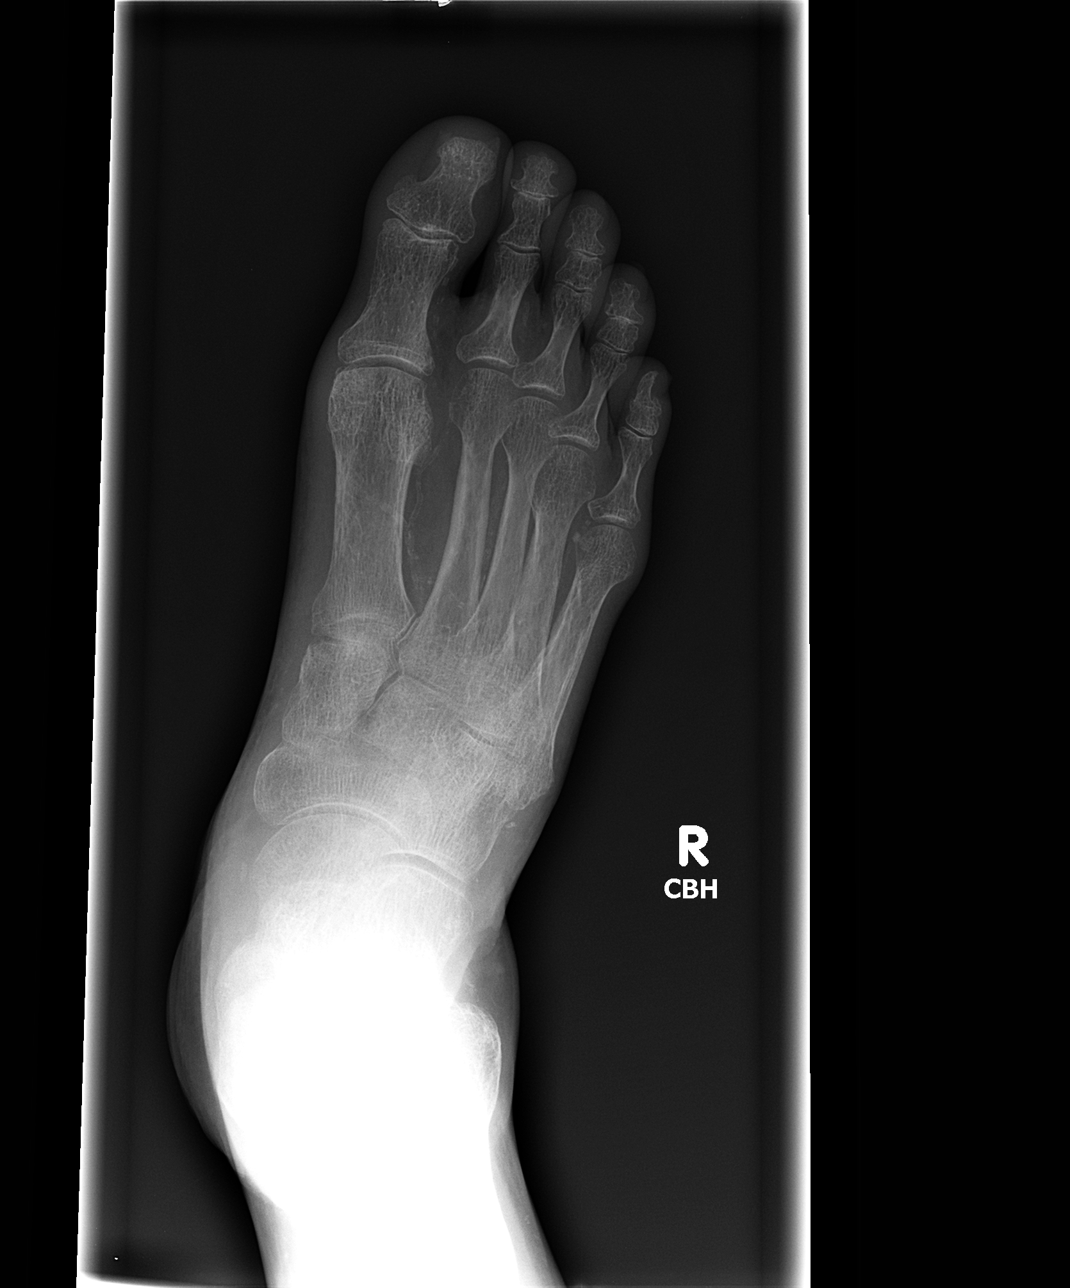

[view not recorded (2 of 3)]
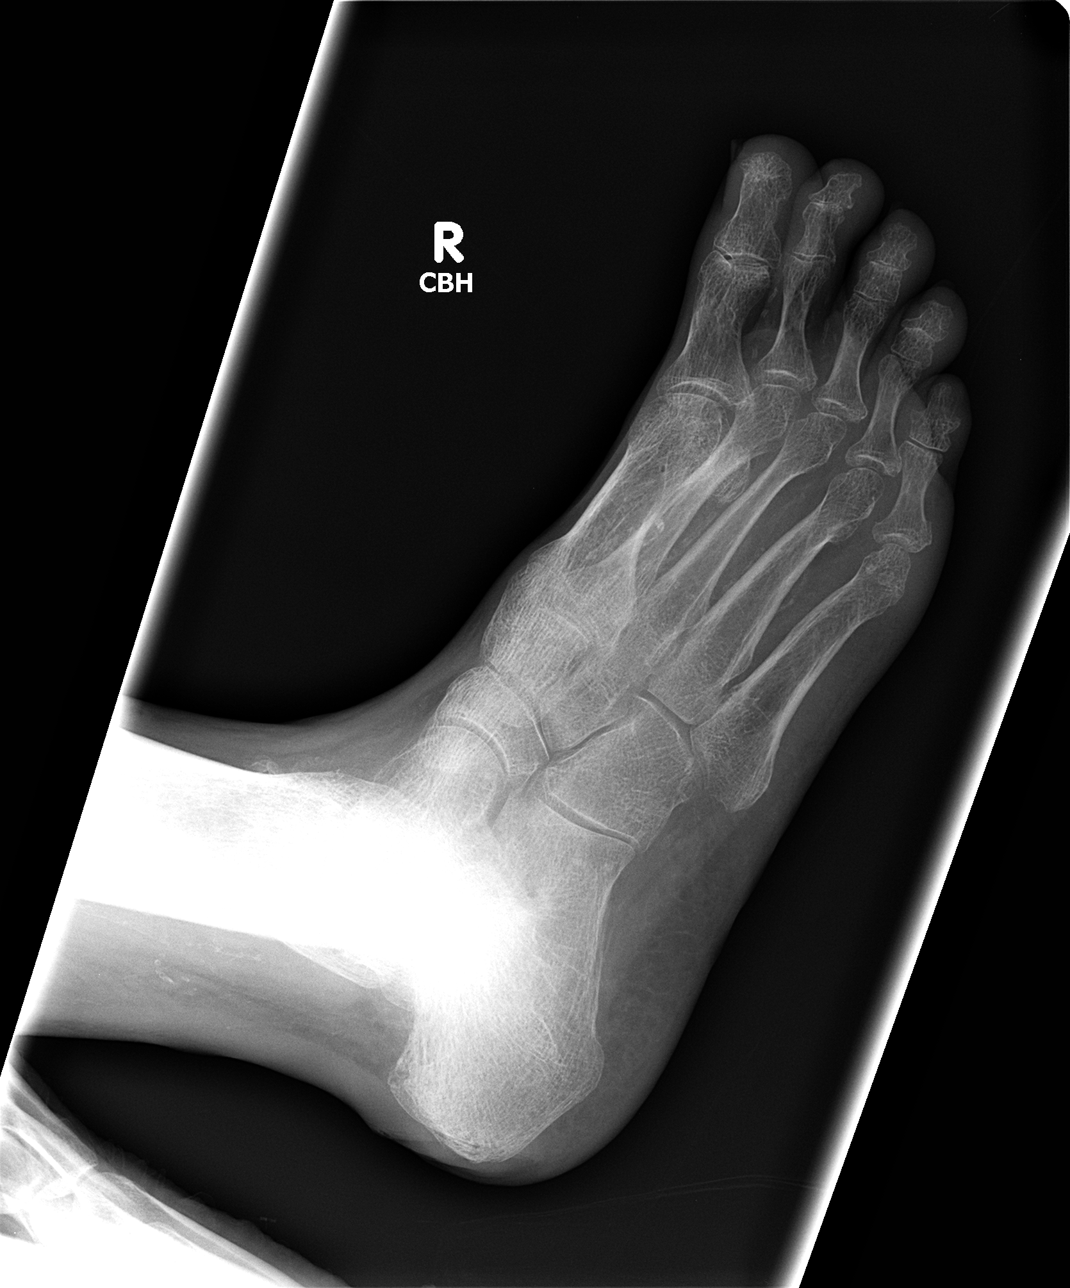

[view not recorded (3 of 3)]
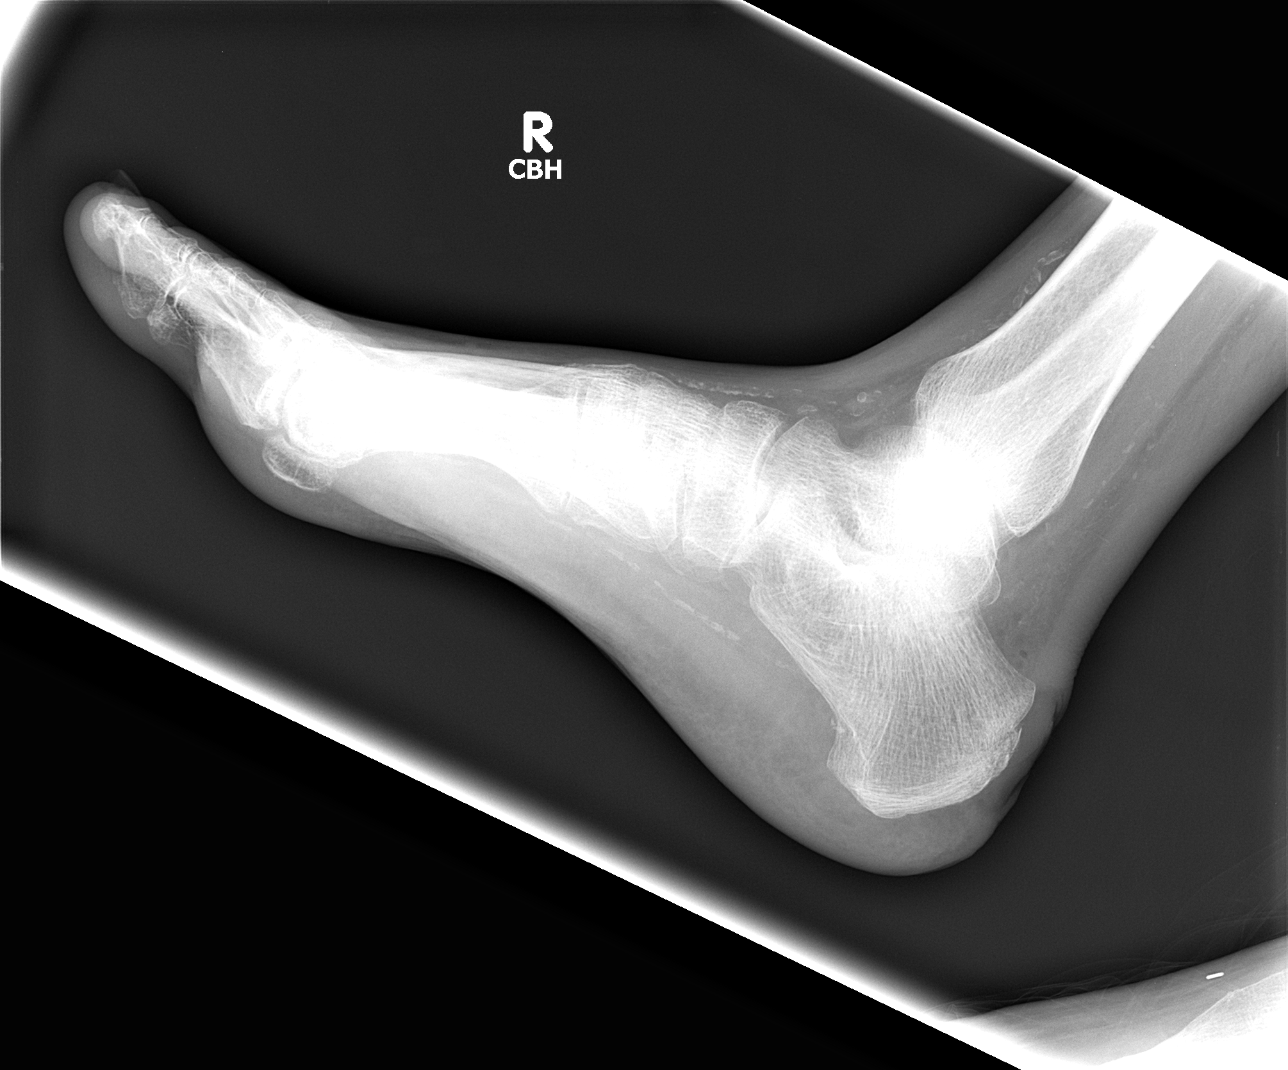

[3 of 3 positions shown; findings below may reference images not displayed]

FINDINGS: Diffuse osteopenia.  Soft tissue calcifications.  No
evidence of acute fracture or subluxation.  Soft tissue defect of
the posterior calcaneus.  No focal bone erosion, periosteal
reaction, or bone sclerosis.  Nothing specific for osteomyelitis.
No significant interval change.
IMPRESSION: Soft tissue defect of the posterior calcaneus suggesting ulcer.  No
underlying bone changes of osteomyelitis.  No acute fracture or
subluxation.

## 2011-05-12 IMAGING — NM NM BONE 3 PHASE
1 series · 12 of 12 positions shown · non-contrast
Comparison: Radiographs of the right foot dated 10/28/2010

CLINICAL DATA: Pressure ulcer over the right calcaneus.

NUCLEAR MEDICINE THREE PHASE BONE SCAN
TECHNIQUE: Radionuclide angiographic images, immediate static
blood pool images, and 3-hour delayed static images were obtained
after intravenous injection of radiopharmaceutical.
Radiopharmaceutical: 27 mCi of technetium 99m MDP IV

[Series 0: 3 phase bone · 9.28mm/px · 2 acquisitions, 12 frames shown]
[im 1/2]
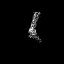
[im 1/2]
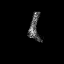
[im 1/2]
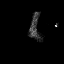
[im 1/2]
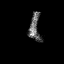
[im 1/2]
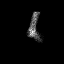
[im 1/2]
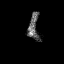
[im 2/2]
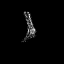
[im 2/2]
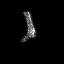
[im 2/2]
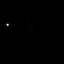
[im 2/2]
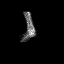
[im 2/2]
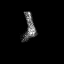
[im 2/2]
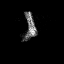

[12 of 12 positions shown; findings below may reference images not displayed]

FINDINGS: The perfusion and blood pool images demonstrate no
asymmetry of perfusion or soft tissue activity.  Delayed bone
images also demonstrate no asymmetry of activity in the ankles or
feet.  Specifically, the area of the pressor laceration over the
posterior aspect of the calcaneus does not demonstrate abnormal
activity on bone scan particularly as compared to the opposite
side.
IMPRESSION: Triple phase bone scan is negative for osteomyelitis of the
calcaneus.

## 2012-06-25 ENCOUNTER — Encounter (HOSPITAL_BASED_OUTPATIENT_CLINIC_OR_DEPARTMENT_OTHER): Payer: Self-pay | Admitting: *Deleted

## 2012-06-25 ENCOUNTER — Other Ambulatory Visit: Payer: Self-pay | Admitting: Orthopedic Surgery

## 2012-06-25 NOTE — Progress Notes (Signed)
Pt was add on for next am-[place]ives [place] [phone]-called for med list Daughter-POA-Monica Colon-cell-[phone] Daughter to bring him-paula [phone] Called for pcp notes-called [facility] to try to get ICD form filled out asap this pm. lyn notified may not be able to get this done by 6am-she cancelled-r/s case until 06/28/12-family notified Would need [employer]

## 2012-06-26 ENCOUNTER — Encounter (HOSPITAL_BASED_OUTPATIENT_CLINIC_OR_DEPARTMENT_OTHER): Payer: Self-pay | Admitting: Anesthesiology

## 2012-06-26 ENCOUNTER — Ambulatory Visit (HOSPITAL_BASED_OUTPATIENT_CLINIC_OR_DEPARTMENT_OTHER): Admission: RE | Admit: 2012-06-26 | Payer: Medicare Other | Source: Ambulatory Visit | Admitting: Orthopedic Surgery

## 2012-06-26 ENCOUNTER — Encounter (HOSPITAL_BASED_OUTPATIENT_CLINIC_OR_DEPARTMENT_OTHER): Admission: RE | Payer: Self-pay | Source: Ambulatory Visit

## 2012-06-26 HISTORY — DX: Presence of automatic (implantable) cardiac defibrillator: Z95.810

## 2012-06-26 HISTORY — DX: Peripheral vascular disease, unspecified: I73.9

## 2012-06-26 HISTORY — DX: Traumatic subdural hemorrhage with loss of consciousness of unspecified duration, initial encounter: S06.5X9A

## 2012-06-26 HISTORY — DX: Complete traumatic amputation of unspecified lower leg, level unspecified, initial encounter: S88.919A

## 2012-06-26 HISTORY — DX: Type 2 diabetes mellitus without complications: E11.9

## 2012-06-26 HISTORY — DX: Essential (primary) hypertension: I10

## 2012-06-26 HISTORY — DX: Hyperlipidemia, unspecified: E78.5

## 2012-06-26 HISTORY — DX: Unspecified convulsions: R56.9

## 2012-06-26 HISTORY — DX: Chronic obstructive pulmonary disease, unspecified: J44.9

## 2012-06-26 HISTORY — DX: Cerebral infarction, unspecified: I63.9

## 2012-06-26 HISTORY — DX: Traumatic subdural hemorrhage with loss of consciousness status unknown, initial encounter: S06.5XAA

## 2012-06-26 SURGERY — AMPUTATION DIGIT
Anesthesia: Choice | Site: Finger | Laterality: Left

## 2012-06-27 ENCOUNTER — Encounter (HOSPITAL_COMMUNITY): Payer: Self-pay | Admitting: *Deleted

## 2012-06-27 ENCOUNTER — Other Ambulatory Visit: Payer: Self-pay | Admitting: Orthopedic Surgery

## 2012-06-27 NOTE — H&P (Signed)
  Esteven Joyce is an 76 y.o. male.   Chief Complaint: left index finger gangrene HPI: 76 yo rhd male with left index finger gangrene.  Began to have problems in July after finger stick for blood sugars.  Resulted in felon of finger treated with I&D 06/13/12.  Grew staph sensitive to TMP/SMX.  Has had subsequent gangrene of tip of finger.  Past Medical History  Diagnosis Date  . Coronary artery stenosis   . Subdural hematoma   . 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 Cardiac defibrillator in place   . CHF (congestive heart failure)   . Diabetes mellitus without complication   . Hypertension   . Cardiomyopathy   . Seizures   . Hyperlipemia   . Stroke   . Peripheral vascular disease   . COPD (chronic obstructive pulmonary disease)   . Shortness of breath   . Dysrhythmia     hx VT    Past Surgical History  Procedure Date  . Leg amputation above knee     right  . Leg amputation below knee     left  . Coronary artery bypass graft 2004  . Cardiac defibrillator placement 2007  . Balloon angioplasty, artery     rt lower leg  . Carotid endarterectomy     bilateral    History reviewed. No pertinent family history. Social History:  reports that he has quit smoking. He does not have any smokeless tobacco history on file. He reports that he does not drink alcohol or use illicit drugs.  Allergies: No Known Allergies  No prescriptions prior to admission    No results found for this or any previous visit (from the past 48 hour(s)).  No results found.   A comprehensive review of systems was negative except for: Constitutional: positive for anorexia Neurological: positive for seizures Behavioral/Psych: positive for depression  There were no vitals taken for this visit.  General appearance: alert, cooperative and appears stated age Head: Normocephalic, without obvious abnormality, atraumatic Neck: supple, symmetrical, trachea midline Resp: clear to  auscultation bilaterally Cardio: regular rate and rhythm GI: non tender Extremities: intact sensation and capillary refill in fingertips except left index where tip is blackened.  bone exposed distally.  no proximal streaking or erythema.  +epl/fpl/io.  previous amputation of left long finger. Pulses: 2+ and symmetric Skin: as above Neurologic: Grossly normal Incision/Wound: As above  Assessment/Plan Left index finger gangrene.  Discussed with patient and family members the nature of condition.  Recommend amputation of finger likely at dip joint or just proximal.  Discussed that if this level does not heal, then may require further amputation at level of mp joint.  Risks, benefits, and alternatives of surgery were discussed and the patient agrees with the plan of care.   Cailee Blanke R 06/27/2012, 9:28 PM

## 2012-06-27 NOTE — Progress Notes (Signed)
Requested records and faxed ICD form for completion to [facility], [place], [place].

## 2012-06-28 ENCOUNTER — Ambulatory Visit (HOSPITAL_COMMUNITY)
Admission: RE | Admit: 2012-06-28 | Discharge: 2012-06-28 | Disposition: A | Discharge status: Skilled Nursing Facility | Payer: Medicare Other | Source: Ambulatory Visit | Attending: Orthopedic Surgery | Admitting: Orthopedic Surgery

## 2012-06-28 ENCOUNTER — Encounter (HOSPITAL_COMMUNITY): Payer: Self-pay | Admitting: Surgery

## 2012-06-28 ENCOUNTER — Encounter (HOSPITAL_COMMUNITY): Payer: Self-pay | Admitting: Anesthesiology

## 2012-06-28 ENCOUNTER — Encounter (HOSPITAL_COMMUNITY)
Admission: RE | Disposition: A | Discharge status: Skilled Nursing Facility | Payer: Self-pay | Source: Ambulatory Visit | Attending: Orthopedic Surgery

## 2012-06-28 ENCOUNTER — Ambulatory Visit (HOSPITAL_COMMUNITY): Payer: Medicare Other | Admitting: Anesthesiology

## 2012-06-28 ENCOUNTER — Ambulatory Visit (HOSPITAL_COMMUNITY): Payer: Medicare Other

## 2012-06-28 DIAGNOSIS — I96 Gangrene, not elsewhere classified: Secondary | ICD-10-CM | POA: Insufficient documentation

## 2012-06-28 DIAGNOSIS — J4489 Other specified chronic obstructive pulmonary disease: Secondary | ICD-10-CM | POA: Insufficient documentation

## 2012-06-28 DIAGNOSIS — J449 Chronic obstructive pulmonary disease, unspecified: Secondary | ICD-10-CM | POA: Insufficient documentation

## 2012-06-28 DIAGNOSIS — I10 Essential (primary) hypertension: Secondary | ICD-10-CM | POA: Insufficient documentation

## 2012-06-28 DIAGNOSIS — E119 Type 2 diabetes mellitus without complications: Secondary | ICD-10-CM | POA: Insufficient documentation

## 2012-06-28 HISTORY — PX: AMPUTATION: SHX166

## 2012-06-28 LAB — BASIC METABOLIC PANEL
BUN: 27 mg/dL — ABNORMAL HIGH (ref 6–23)
Chloride: 106 mEq/L (ref 96–112)
GFR calc Af Amer: 57 mL/min — ABNORMAL LOW (ref >90)
Glucose, Bld: 122 mg/dL — ABNORMAL HIGH (ref 70–99)
Potassium: 4.8 mEq/L (ref 3.5–5.1)
Sodium: 137 mEq/L (ref 135–145)

## 2012-06-28 LAB — SURGICAL PCR SCREEN: MRSA, PCR: NEGATIVE

## 2012-06-28 LAB — CBC
HCT: 33.6 % — ABNORMAL LOW (ref 39.0–52.0)
Hemoglobin: 10.9 g/dL — ABNORMAL LOW (ref 13.0–17.0)
WBC: 11.6 10*3/uL — ABNORMAL HIGH (ref 4.0–10.5)

## 2012-06-28 LAB — GLUCOSE, CAPILLARY
Glucose-Capillary: 114 mg/dL — ABNORMAL HIGH (ref 70–99)
Glucose-Capillary: 108 mg/dL — ABNORMAL HIGH (ref 70–99)

## 2012-06-28 SURGERY — AMPUTATION DIGIT
Anesthesia: General | Site: Finger | Laterality: Left | Wound class: Contaminated

## 2012-06-28 MED ORDER — HYDROCODONE-ACETAMINOPHEN 5-325 MG PO TABS: ORAL_TABLET | ORAL | Status: DC

## 2012-06-28 MED ORDER — BUPIVACAINE HCL (PF) 0.25 % IJ SOLN
INTRAMUSCULAR | Status: AC
  Filled 2012-06-28: qty 30

## 2012-06-28 MED ORDER — CEFAZOLIN SODIUM-DEXTROSE 2-3 GM-% IV SOLR
INTRAVENOUS | Status: AC
  Filled 2012-06-28: qty 50

## 2012-06-28 MED ORDER — CEFAZOLIN SODIUM-DEXTROSE 2-3 GM-% IV SOLR
2.0000 g | Freq: Once | INTRAVENOUS | Status: AC
  Administered 2012-06-28: 2 g via INTRAVENOUS
  Filled 2012-06-28: qty 50

## 2012-06-28 MED ORDER — FENTANYL CITRATE 0.05 MG/ML IJ SOLN
INTRAMUSCULAR | Status: DC | PRN
  Administered 2012-06-28: 50 ug via INTRAVENOUS

## 2012-06-28 MED ORDER — OXYCODONE HCL 5 MG/5ML PO SOLN: 5.0000 mg | Freq: Once | ORAL | Status: DC | PRN

## 2012-06-28 MED ORDER — LACTATED RINGERS IV SOLN
INTRAVENOUS | Status: DC | PRN
  Administered 2012-06-28: 08:00:00 via INTRAVENOUS

## 2012-06-28 MED ORDER — LACTATED RINGERS IV SOLN
INTRAVENOUS | Status: AC
  Administered 2013-11-28: 08:00:00 via INTRAVENOUS

## 2012-06-28 MED ORDER — HYDROMORPHONE HCL PF 1 MG/ML IJ SOLN: 0.2500 mg | INTRAMUSCULAR | Status: DC | PRN

## 2012-06-28 MED ORDER — OXYCODONE HCL 5 MG PO TABS: 5.0000 mg | ORAL_TABLET | Freq: Once | ORAL | Status: DC | PRN

## 2012-06-28 MED ORDER — SULFAMETHOXAZOLE-TRIMETHOPRIM 800-160 MG PO TABS: 1.0000 | ORAL_TABLET | Freq: Two times a day (BID) | ORAL | Status: DC

## 2012-06-28 MED ORDER — LIDOCAINE HCL (PF) 1 % IJ SOLN
INTRAMUSCULAR | Status: DC | PRN
  Administered 2012-06-28: 5 mL

## 2012-06-28 MED ORDER — MUPIROCIN 2 % EX OINT
TOPICAL_OINTMENT | Freq: Two times a day (BID) | CUTANEOUS | Status: DC
  Filled 2012-06-28: qty 22

## 2012-06-28 MED ORDER — ONDANSETRON HCL 4 MG/2ML IJ SOLN
INTRAMUSCULAR | Status: DC | PRN
  Administered 2012-06-28: 4 mg via INTRAVENOUS

## 2012-06-28 MED ORDER — CHLORHEXIDINE GLUCONATE 4 % EX LIQD: 60.0000 mL | Freq: Once | CUTANEOUS | Status: DC

## 2012-06-28 MED ORDER — PROMETHAZINE HCL 25 MG/ML IJ SOLN: 6.2500 mg | INTRAMUSCULAR | Status: DC | PRN

## 2012-06-28 MED ORDER — BUPIVACAINE HCL (PF) 0.25 % IJ SOLN
INTRAMUSCULAR | Status: DC | PRN
  Administered 2012-06-28: 5 mL

## 2012-06-28 MED ORDER — MUPIROCIN 2 % EX OINT
TOPICAL_OINTMENT | CUTANEOUS | Status: AC
  Filled 2012-06-28: qty 22

## 2012-06-28 MED ORDER — LIDOCAINE HCL (PF) 1 % IJ SOLN
INTRAMUSCULAR | Status: AC
  Filled 2012-06-28: qty 30

## 2012-06-28 MED ORDER — PROPOFOL 10 MG/ML IV BOLUS
INTRAVENOUS | Status: DC | PRN
  Administered 2012-06-28: 100 mg via INTRAVENOUS

## 2012-06-28 SURGICAL SUPPLY — 44 items
BANDAGE COBAN STERILE 2 (GAUZE/BANDAGES/DRESSINGS) ×2 IMPLANT
BANDAGE CONFORM 2  STR LF (GAUZE/BANDAGES/DRESSINGS) ×2 IMPLANT
BANDAGE GAUZE ELAST BULKY 4 IN (GAUZE/BANDAGES/DRESSINGS) IMPLANT
BLADE LONG MED 31X9 (MISCELLANEOUS) IMPLANT
BNDG COHESIVE 3X5 TAN STRL LF (GAUZE/BANDAGES/DRESSINGS) IMPLANT
BNDG ESMARK 4X9 LF (GAUZE/BANDAGES/DRESSINGS) ×2 IMPLANT
CLOTH BEACON ORANGE TIMEOUT ST (SAFETY) ×2 IMPLANT
CORDS BIPOLAR (ELECTRODE) ×2 IMPLANT
CUFF TOURNIQUET SINGLE 18IN (TOURNIQUET CUFF) ×2 IMPLANT
CUFF TOURNIQUET SINGLE 24IN (TOURNIQUET CUFF) IMPLANT
DRAPE SURG 17X23 STRL (DRAPES) ×2 IMPLANT
DRSG KUZMA FLUFF (GAUZE/BANDAGES/DRESSINGS) IMPLANT
DURAPREP 26ML APPLICATOR (WOUND CARE) ×2 IMPLANT
GAUZE XEROFORM 1X8 LF (GAUZE/BANDAGES/DRESSINGS) ×2 IMPLANT
GLOVE BIO SURGEON STRL SZ 6.5 (GLOVE) ×2 IMPLANT
GLOVE BIOGEL PI IND STRL 6.5 (GLOVE) ×3 IMPLANT
GLOVE BIOGEL PI INDICATOR 6.5 (GLOVE) ×3
GLOVE SURG ORTHO 8.0 STRL STRW (GLOVE) ×2 IMPLANT
GOWN PREVENTION PLUS XLARGE (GOWN DISPOSABLE) ×6 IMPLANT
GOWN STRL NON-REIN LRG LVL3 (GOWN DISPOSABLE) ×4 IMPLANT
GOWN STRL REIN XL XLG (GOWN DISPOSABLE) ×2 IMPLANT
KIT BASIN OR (CUSTOM PROCEDURE TRAY) ×2 IMPLANT
KIT ROOM TURNOVER OR (KITS) ×2 IMPLANT
LOOP VESSEL MINI RED (MISCELLANEOUS) ×2 IMPLANT
MANIFOLD NEPTUNE II (INSTRUMENTS) IMPLANT
NEEDLE HYPO 25GX1X1/2 BEV (NEEDLE) ×2 IMPLANT
NS IRRIG 1000ML POUR BTL (IV SOLUTION) ×2 IMPLANT
PACK ORTHO EXTREMITY (CUSTOM PROCEDURE TRAY) ×2 IMPLANT
PAD ARMBOARD 7.5X6 YLW CONV (MISCELLANEOUS) ×4 IMPLANT
PAD CAST 4YDX4 CTTN HI CHSV (CAST SUPPLIES) IMPLANT
PADDING CAST COTTON 4X4 STRL (CAST SUPPLIES)
RUBBERBAND STERILE (MISCELLANEOUS) ×2 IMPLANT
SPECIMEN JAR SMALL (MISCELLANEOUS) ×2 IMPLANT
SPONGE GAUZE 4X4 12PLY (GAUZE/BANDAGES/DRESSINGS) ×2 IMPLANT
SPONGE SCRUB IODOPHOR (GAUZE/BANDAGES/DRESSINGS) ×2 IMPLANT
SUT ETHILON 5 0 PS 2 18 (SUTURE) IMPLANT
SUT MON AB 5-0 P3 18 (SUTURE) ×2 IMPLANT
SUT SILK 4 0 PS 2 (SUTURE) IMPLANT
SUT VICRYL 4-0 PS2 18IN ABS (SUTURE) IMPLANT
SYR CONTROL 10ML LL (SYRINGE) ×2 IMPLANT
TOWEL OR 17X24 6PK STRL BLUE (TOWEL DISPOSABLE) ×2 IMPLANT
TOWEL OR 17X26 10 PK STRL BLUE (TOWEL DISPOSABLE) ×2 IMPLANT
UNDERPAD 30X30 INCONTINENT (UNDERPADS AND DIAPERS) ×2 IMPLANT
WATER STERILE IRR 1000ML POUR (IV SOLUTION) ×2 IMPLANT

## 2012-06-28 NOTE — Progress Notes (Signed)
Spoke w/ Child psychotherapist, Corporate treasurer, re: family's concerns. She stated if pt stays over night,  She will need order from MD to see pt.  If pt goes home today, postop nurse will need to call her prior to discharge.  Talbert Forest in PACU notified of what Delray Alt stated.  Also, J. Brewer, Postop RN, notified of need to notify SW.

## 2012-06-28 NOTE — Progress Notes (Signed)
Pt has faint pulses  Radially bilateral ... Normal cap refill  Left  Fingers.  And moves all  Digits on the left.

## 2012-06-28 NOTE — Progress Notes (Signed)
Clinical Social Work-CSW received phone call from short stay regarding interpreting services and family contact-pt non english speaking and reportedly from SNF-CSW contacted Surveyor, [employer] and left message with request-CSW also able to reach family who were in waiting room. CSW escorted family to short stay-No further needs at this time-Francisco Brock-MSW, [phone]

## 2012-06-28 NOTE — Progress Notes (Signed)
Social Work [phone]).  Message left on machine re: family concerns.  Will try to call later to actually speak with social worker.

## 2012-06-28 NOTE — Anesthesia Preprocedure Evaluation (Addendum)
Anesthesia Evaluation  Patient identified by MRN, date of birth, ID band Patient awake    Reviewed: Allergy & Precautions, H&P , NPO status , Patient's Chart, lab work & pertinent test results, reviewed documented beta blocker date and time   History of Anesthesia Complications Negative for: history of anesthetic complications  Airway Mallampati: II TM Distance: >3 FB Neck ROM: Full    Dental  (+) Teeth Intact and Dental Advisory Given   Pulmonary shortness of breath and with exertion, COPD   Pulmonary exam normal       Cardiovascular hypertension, Pt. on home beta blockers + CAD, + CABG, + Peripheral Vascular Disease and +CHF + dysrhythmias Ventricular Tachycardia     Neuro/Psych CVA, Residual Symptoms    GI/Hepatic negative GI ROS, Neg liver ROS,   Endo/Other  diabetes, Oral Hypoglycemic Agents  Renal/GU Renal InsufficiencyRenal disease     Musculoskeletal   Abdominal   Peds  Hematology   Anesthesia Other Findings   Reproductive/Obstetrics                           Anesthesia Physical Anesthesia Plan  ASA: III  Anesthesia Plan: General   Post-op Pain Management:    Induction: Intravenous  Airway Management Planned: LMA  Additional Equipment:   Intra-op Plan:   Post-operative Plan: Extubation in OR  Informed Consent: I have reviewed the patients History and Physical, chart, labs and discussed the procedure including the risks, benefits and alternatives for the proposed anesthesia with the patient or authorized representative who has indicated his/her understanding and acceptance.   Dental advisory given  Plan Discussed with: CRNA, Anesthesiologist and Surgeon  Anesthesia Plan Comments:         Anesthesia Quick Evaluation

## 2012-06-28 NOTE — Progress Notes (Signed)
EKG on admission noted to show inferior infarct and Right Bundle Branch Block.  Not showing on previous EKG tracing.  Dr. Krista Blue notified.

## 2012-06-28 NOTE — Anesthesia Postprocedure Evaluation (Signed)
Anesthesia Post Note  Patient: Francisco Brock  Procedure(s) Performed: Procedure(s) (LRB): AMPUTATION DIGIT (Left)  Anesthesia type: general  Patient location: PACU  Post pain: Pain level controlled  Post assessment: Patient's Cardiovascular Status Stable  Last Vitals:  Filed Vitals:   06/28/12 1103  BP: 109/36  Pulse: 53  Temp: 36.2 C  Resp: 20    Post vital signs: Reviewed and stable  Level of consciousness: sedated  Complications: No apparent anesthesia complications

## 2012-06-28 NOTE — Interval H&P Note (Signed)
History and Physical Interval Note:  06/28/2012 7:38 AM  Francisco Brock  has presented today for surgery, with the diagnosis of left index finger gangrene  The various methods of treatment have been discussed with the patient and family. After consideration of risks, benefits and other options for treatment, the patient has consented to  Procedure(s) (LRB) with comments: AMPUTATION DIGIT (Left) as a surgical intervention .  The patient's history has been reviewed, patient examined, no change in status, stable for surgery.  I have reviewed the patient's chart and labs.  Questions were answered to the patient's satisfaction.     [facility] R  Patient seen and re examined.  The plan of care was reviewed with the patient and no changes are made.

## 2012-06-28 NOTE — Progress Notes (Addendum)
While interviewing family during preop assessment, they stated they felt  that Francisco Brock [facility] facility neglected to get pt proper assistance for issue he is admitted for today.  They stated they had been pursing staff/md for over 3 months.

## 2012-06-28 NOTE — Op Note (Signed)
Dictation [id number]

## 2012-06-28 NOTE — Transfer of Care (Signed)
Immediate Anesthesia Transfer of Care Note  Patient: Francisco Brock  Procedure(s) Performed: Procedure(s) (LRB) with comments: AMPUTATION DIGIT (Left) - left  distal index finger  Patient Location: PACU  Anesthesia Type: General  Level of Consciousness: awake, sedated and patient cooperative  Airway & Oxygen Therapy: Patient Spontanous Breathing and Patient connected to face mask oxygen  Post-op Assessment: Report given to PACU RN, Post -op Vital signs reviewed and stable and Patient moving all extremities  Post vital signs: Reviewed and stable  Complications: No apparent anesthesia complications

## 2012-06-28 NOTE — Op Note (Signed)
NAMEVERNIS, EID NO.:  [id number]  MEDICAL RECORD NO.:  [id number]  LOCATION:  [facility]                         FACILITY:  [facility]  PHYSICIAN:  Betha Loa, MD        DATE OF BIRTH:  10/03/1935  DATE OF PROCEDURE:  06/28/2012 DATE OF DISCHARGE:  06/28/2012                              OPERATIVE REPORT   PREOPERATIVE DIAGNOSIS:  Left index finger gangrene.  POSTOPERATIVE DIAGNOSIS:  Left index finger gangrene.  PROCEDURE:  Left index finger amputation just proximal to DIP joint.  SURGEON:  Betha Loa, MD  ASSISTANT:  None.  ANESTHESIA:  General.  IV FLUIDS:  Per anesthesia flow sheet.  ESTIMATED BLOOD LOSS:  Minimal.  COMPLICATIONS:  None.  SPECIMENS:  Left index fingertip to Pathology.  TOURNIQUET TIME:  41 minutes.  DISPOSITION:  Stable to PACU.  INDICATIONS:  Mr. Goodell is a 76 year old male who apparently in July had a fingerstick blood glucose drawn on his index finger and subsequently had infection.  This was drained in the office couple of weeks ago.  He began to have gangrene of the fingertip.  I discussed with Mr. Ravert and his daughter the nature of his condition.  I recommended amputation at approximately the level of the DIP joint.  We discussed that this may or may not heal at this level and if it did not, he would probably require an amputation at the level of the MP joint.  Risks, benefits, and alternatives of surgery were discussed including risk of blood loss, infection, damage to nerves, vessels, tendons, ligaments, bone; failure of surgery; need for additional surgery, complications, wound healing, continued pain, need for further amputation.  They voiced understanding of these risks and elected to proceed.  OPERATIVE COURSE:  After being identified preoperatively by myself, the patient and I agreed upon the procedure and site procedure.  Surgical site was marked.  Risks, benefits, and alternatives of surgery were reviewed and  they wished to proceed.  Surgical consent had been signed. He was given 2 g of IV Ancef as preoperative antibiotic prophylaxis.  He was transported to the operating room and placed on the operating table in supine position with left upper extremity on arm board.  General anesthesia was induced by the anesthesiologist.  The left upper extremity was prepped and draped in normal sterile orthopedic fashion. A surgical pause was performed between surgeons, anesthesia, operating staff, and all were in agreement as to the patient, procedure, and site of procedure.  Tourniquet at the proximal aspect of the extremity was inflated to 250 mmHg after exsanguination of the limb with an Esmarch bandage.  The finger was inspected.  It was gangrenous at the tip.  A 15 blade scalp was used to incise all around the gangrenous portion.  This was carried down to the bone.  The nail fold was excised in its entirety.  The joint was identified.  The distal phalanx was amputated through the joint.  It was sent to Pathology for examination.  The volar plate was removed.  The tendon was cut as well.  Some of the subcutaneous tissues in the pad of the finger were questionable looking and were  removed.  The condyles were taken down with a rongeur and the middle phalanx slightly shortened.  It was shortened to the point where the skin could be closed over top without any tension.  The digital nerves were identified and were shortened.  Bipolar electrocautery was used to cauterize the ends of the arteries.  Care was taken to preserve as much blood flow to the volar flap as possible.  The wound was copiously irrigated with sterile saline.  It was closed with 5-0 Monocryl in an interrupted fashion.  A vessel loop drain was placed in the finger for use as a drain.  The finger had been injected with 10 mL of a half and half solution of 0.25% plain Marcaine, 1% plain lidocaine at the start of the case.  The wounds were then  dressed with sterile Xeroform, 4x4, and wrapped with Kling and a Coban dressing lightly. Tourniquet was deflated at 41 minutes.  The patient was awoken from anesthesia safely.  He was transferred back to stretcher and taken to PACU in stable condition.  I will see him back in the office in 4 days for postoperative followup.  I will give him Norco 5/325, 1-2 p.o. q.6 hours p.r.n. pain, dispensed #40, and Bactrim DS 1 p.o. b.i.d. x7 days.     Betha Loa, MD     KK/MEDQ  D:  06/28/2012  T:  06/28/2012  Job:  [id number]

## 2012-06-29 MED FILL — Mupirocin Oint 2%: CUTANEOUS | Qty: 22 | Status: AC

## 2012-07-02 ENCOUNTER — Encounter (HOSPITAL_COMMUNITY): Payer: Self-pay | Admitting: Orthopedic Surgery

## 2013-02-06 ENCOUNTER — Encounter: Payer: Self-pay | Admitting: Adult Health

## 2013-02-06 ENCOUNTER — Non-Acute Institutional Stay (SKILLED_NURSING_FACILITY): Payer: Medicare Other | Admitting: Adult Health

## 2013-02-06 DIAGNOSIS — I2510 Atherosclerotic heart disease of native coronary artery without angina pectoris: Secondary | ICD-10-CM

## 2013-02-06 DIAGNOSIS — I509 Heart failure, unspecified: Secondary | ICD-10-CM

## 2013-02-06 DIAGNOSIS — I10 Essential (primary) hypertension: Secondary | ICD-10-CM

## 2013-02-06 DIAGNOSIS — E785 Hyperlipidemia, unspecified: Secondary | ICD-10-CM

## 2013-02-06 DIAGNOSIS — E1159 Type 2 diabetes mellitus with other circulatory complications: Secondary | ICD-10-CM

## 2013-02-06 DIAGNOSIS — I739 Peripheral vascular disease, unspecified: Secondary | ICD-10-CM | POA: Insufficient documentation

## 2013-02-06 NOTE — Assessment & Plan Note (Signed)
Is stable will continue digoxin 0.125 mg daily spironolactone 50 mg daily

## 2013-02-06 NOTE — Assessment & Plan Note (Signed)
Will continue zetia 10 mg daily and crestor 20 mg daily; will check lipid panel

## 2013-02-06 NOTE — Progress Notes (Signed)
Patient ID: Francisco Brock, male   DOB: 09/17/1935, 77 y.o.   MRN: [id number]  FACILITY: [facility]  No Known Allergies   Chief Complaint  Patient presents with  . Medical Managment of Chronic Issues    HPI: He is being seen for the management of his chronic illnesses; overall he is remaining stable there are no concerns being voiced by the staff members. He is tolerating his medications without difficulty; he remains social with others and is out and around the facility.     Past Medical History  Diagnosis Date  . Coronary artery stenosis   . Subdural hematoma   . 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 Cardiac defibrillator in place   . CHF (congestive heart failure)   . Diabetes mellitus without complication   . Hypertension   . Cardiomyopathy   . Seizures   . Hyperlipemia   . Stroke   . Peripheral vascular disease   . COPD (chronic obstructive pulmonary disease)   . Shortness of breath   . Dysrhythmia     hx VT    Past Surgical History  Procedure Laterality Date  . Leg amputation above knee      right  . Leg amputation below knee      left  . Coronary artery bypass graft  2004  . Cardiac defibrillator placement  2007  . Balloon angioplasty, artery      rt lower leg  . Carotid endarterectomy      bilateral  . Amputation  06/28/2012    Procedure: AMPUTATION DIGIT;  Surgeon: Tami Ribas, MD;  Location: [facility] OR;  Service: Orthopedics;  Laterality: Left;  left  distal index finger    VITAL SIGNS BP 140/52  Pulse 70  Ht 4\' 9"  (1.448 m)  Wt 151 lb (68.493 kg)  BMI 32.67 kg/m2   Patient's Medications  New Prescriptions   No medications on file  Previous Medications   ASPIRIN 81 MG TABLET    Take 81 mg by mouth daily.   BISACODYL (DULCOLAX) 5 MG EC TABLET    Take 5 mg by mouth daily as needed. For constipation   CARVEDILOL (COREG) 25 MG TABLET    Take 25 mg by mouth 2 (two) times daily with a meal.   CLOPIDOGREL (PLAVIX) 75 MG TABLET     Take 75 mg by mouth daily.   DIGOXIN (LANOXIN) 0.125 MG TABLET    Take 0.125 mg by mouth daily.   ENALAPRIL (VASOTEC) 5 MG TABLET    Take 5 mg by mouth 2 (two) times daily.   EZETIMIBE (ZETIA) 10 MG TABLET    Take 10 mg by mouth daily.   HYDROCODONE-ACETAMINOPHEN (NORCO) 5-325 MG PER TABLET    1-2 tabs po q6 hours prn pain   METFORMIN (GLUCOPHAGE) 500 MG TABLET    Take 850 mg by mouth daily.    MULTIPLE VITAMINS-MINERALS (MULTIVITAMIN WITH MINERALS) TABLET    Take 1 tablet by mouth daily.   NITROGLYCERIN (NITROSTAT) 0.4 MG SL TABLET    Place 0.4 mg under the tongue every 5 (five) minutes as needed for chest pain.   RANOLAZINE (RANEXA) 500 MG 12 HR TABLET    Take 500 mg by mouth 2 (two) times daily.   ROSUVASTATIN (CRESTOR) 40 MG TABLET    Take 20 mg by mouth at bedtime.    SPIRONOLACTONE (ALDACTONE) 50 MG TABLET    Take 50 mg by mouth  daily.   TRAMADOL (ULTRAM) 50 MG TABLET    Take 50 mg by mouth every 6 (six) hours as needed for pain.  Modified Medications   No medications on file  Discontinued Medications   DOCUSATE SODIUM (COLACE) 100 MG CAPSULE    Take 100 mg by mouth 2 (two) times daily.   GABAPENTIN (NEURONTIN) 100 MG CAPSULE    Take 100 mg by mouth 2 times daily at 12 noon and 4 pm.   SULFAMETHOXAZOLE-TRIMETHOPRIM (BACTRIM DS) 800-160 MG PER TABLET    Take 1 tablet by mouth 2 (two) times daily.   TRAZODONE (DESYREL) 50 MG TABLET    Take 50 mg by mouth at bedtime.    SIGNIFICANT DIAGNOSTIC EXAMS   09-21-12: glucose 125; bun 25; creat 1.26; k+ 4.4; na++ 140    Review of Systems  Unable to perform ROS    Physical Exam  Constitutional: He appears well-developed and well-nourished.  Neck: Neck supple. No JVD present. No thyromegaly present.  Cardiovascular: Normal rate and regular rhythm.   Respiratory: Effort normal.  GI: Soft. Bowel sounds are normal. He exhibits no distension. There is no tenderness.  Musculoskeletal:  Right aka left bka; full range of motion upper  extremities  Neurological: He is alert.  Skin: Skin is warm and dry.  Psychiatric: He has a normal mood and affect.       ASSESSMENT/ PLAN:  Dyslipidemia Will continue zetia 10 mg daily and crestor 20 mg daily; will check lipid panel  Type II or unspecified type diabetes mellitus with peripheral circulatory disorders, uncontrolled(250.72) Is stable will continue metformi 875 mg daily and will check hgb a1c   CHF (congestive heart failure) Is stable will continue digoxin 0.125 mg daily spironolactone 50 mg daily   Essential hypertension, benign Is stable will continue coreg 25 mg twice daily and vasotec 5 mg daily   CAD (coronary artery disease) Is stable will continue asa 81 mg daily; plavix 75 mg daily and ranexa er 500 mg twice daily   PVD (peripheral vascular disease) Is status post right aka and left bka; will continue ultram 50 mg every 6 hours as needed for mild pain and vicodin 5/325 mg 1-2 tabs every 6 hours as moderate to severe pain and will monitor   Will check cbc and cmp  Time spent with patient 50 minutes

## 2013-02-06 NOTE — Assessment & Plan Note (Signed)
Is stable will continue metformi 875 mg daily and will check hgb a1c

## 2013-02-06 NOTE — Assessment & Plan Note (Signed)
Is stable will continue asa 81 mg daily; plavix 75 mg daily and ranexa er 500 mg twice daily

## 2013-02-06 NOTE — Assessment & Plan Note (Signed)
Is stable will continue coreg 25 mg twice daily and vasotec 5 mg daily

## 2013-02-06 NOTE — Assessment & Plan Note (Signed)
Is status post right aka and left bka; will continue ultram 50 mg every 6 hours as needed for mild pain and vicodin 5/325 mg 1-2 tabs every 6 hours as moderate to severe pain and will monitor

## 2013-03-20 ENCOUNTER — Encounter: Payer: Self-pay | Admitting: Adult Health

## 2013-03-20 ENCOUNTER — Non-Acute Institutional Stay (SKILLED_NURSING_FACILITY): Payer: Medicare Other | Admitting: Adult Health

## 2013-03-20 DIAGNOSIS — I739 Peripheral vascular disease, unspecified: Secondary | ICD-10-CM

## 2013-03-20 DIAGNOSIS — I509 Heart failure, unspecified: Secondary | ICD-10-CM

## 2013-03-20 DIAGNOSIS — I10 Essential (primary) hypertension: Secondary | ICD-10-CM

## 2013-03-20 DIAGNOSIS — Z8673 Personal history of transient ischemic attack (TIA), and cerebral infarction without residual deficits: Secondary | ICD-10-CM | POA: Insufficient documentation

## 2013-03-20 DIAGNOSIS — I2510 Atherosclerotic heart disease of native coronary artery without angina pectoris: Secondary | ICD-10-CM

## 2013-03-20 DIAGNOSIS — E785 Hyperlipidemia, unspecified: Secondary | ICD-10-CM

## 2013-03-20 DIAGNOSIS — I639 Cerebral infarction, unspecified: Secondary | ICD-10-CM

## 2013-03-20 DIAGNOSIS — E1159 Type 2 diabetes mellitus with other circulatory complications: Secondary | ICD-10-CM

## 2013-03-20 DIAGNOSIS — I6350 Cerebral infarction due to unspecified occlusion or stenosis of unspecified cerebral artery: Secondary | ICD-10-CM

## 2013-03-20 MED ORDER — ENALAPRIL MALEATE 5 MG PO TABS: 2.5000 mg | ORAL_TABLET | Freq: Every day | ORAL | Status: DC

## 2013-03-20 NOTE — Assessment & Plan Note (Signed)
Is stable will continue plavix 75 mg daily and asa 81 mg daily

## 2013-03-20 NOTE — Assessment & Plan Note (Signed)
Is status post right aka and left bka; will continue pain medications: ultram 50 mg every 6 hours as needed for mild pain and vicodin 5/325 mg 1-2 tabs every 6 hours as needed for moderate to severe pain and will monitor

## 2013-03-20 NOTE — Assessment & Plan Note (Signed)
Is stable will continue renexa er 500 mg twice daily asa 81 mg daily and plavix 75 mg daily

## 2013-03-20 NOTE — Assessment & Plan Note (Signed)
Will continue metformin 875 mg daily hgb a1c is 7.5 and will monitor status

## 2013-03-20 NOTE — Progress Notes (Signed)
Patient ID: Francisco Brock, male   DOB: 1935/11/21, 77 y.o.   MRN: [id number]  [facility]  No Known Allergies   Chief Complaint  Patient presents with  . Medical Managment of Chronic Issues    HPI: He is being seen for the management of his chronic illnesses. Overall he is doing well. He cannot fully participate in the hpi or ros. His blood pressure have been running slightly low with a creat of 1.67 and k+ of 5.1; will need to reduce his vasotec.   Past Medical History  Diagnosis Date  . Coronary artery stenosis   . Subdural hematoma   . 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 Cardiac defibrillator in place   . CHF (congestive heart failure)   . Diabetes mellitus without complication   . Hypertension   . Cardiomyopathy   . Seizures   . Hyperlipemia   . Stroke   . Peripheral vascular disease   . COPD (chronic obstructive pulmonary disease)   . Shortness of breath   . Dysrhythmia     hx VT    Past Surgical History  Procedure Laterality Date  . Leg amputation above knee      right  . Leg amputation below knee      left  . Coronary artery bypass graft  2004  . Cardiac defibrillator placement  2007  . Balloon angioplasty, artery      rt lower leg  . Carotid endarterectomy      bilateral  . Amputation  06/28/2012    Procedure: AMPUTATION DIGIT;  Surgeon: Tami Ribas, MD;  Location: [facility] OR;  Service: Orthopedics;  Laterality: Left;  left  distal index finger    VITAL SIGNS BP 91/55  Pulse 67  Ht 4\' 9"  (1.448 m)  Wt 148 lb 9.6 oz (67.405 kg)  BMI 32.15 kg/m2   Patient's Medications  New Prescriptions   No medications on file  Previous Medications   ASPIRIN 81 MG TABLET    Take 81 mg by mouth daily.   CARVEDILOL (COREG) 25 MG TABLET    Take 25 mg by mouth 2 (two) times daily with a meal.   CLOPIDOGREL (PLAVIX) 75 MG TABLET    Take 75 mg by mouth daily.   DIGOXIN (LANOXIN) 0.125 MG TABLET    Take 0.125 mg by mouth daily.   ENALAPRIL  (VASOTEC) 5 MG TABLET    Take 5 mg by mouth daily.    EZETIMIBE (ZETIA) 10 MG TABLET    Take 10 mg by mouth daily.   METFORMIN (GLUCOPHAGE) 500 MG TABLET    Take 850 mg by mouth daily.    MULTIPLE VITAMINS-MINERALS (MULTIVITAMIN WITH MINERALS) TABLET    Take 1 tablet by mouth daily.   RANOLAZINE (RANEXA) 500 MG 12 HR TABLET    Take 500 mg by mouth 2 (two) times daily.   ROSUVASTATIN (CRESTOR) 40 MG TABLET    Take 20 mg by mouth at bedtime.    SPIRONOLACTONE (ALDACTONE) 50 MG TABLET    Take 50 mg by mouth daily.   TRAMADOL (ULTRAM) 50 MG TABLET    Take 50 mg by mouth every 6 (six) hours as needed for pain.  Modified Medications   Modified Medication Previous Medication   HYDROCODONE-ACETAMINOPHEN (NORCO/VICODIN) 5-325 MG PER TABLET HYDROcodone-acetaminophen (NORCO) 5-325 MG per tablet      Take 1-2 tablets by mouth every 6 (six) hours as needed. 1 or  2 tabs every 6 hours as needed    1-2 tabs po q6 hours prn pain  Discontinued Medications   BISACODYL (DULCOLAX) 5 MG EC TABLET    Take 5 mg by mouth daily as needed. For constipation   NITROGLYCERIN (NITROSTAT) 0.4 MG SL TABLET    Place 0.4 mg under the tongue every 5 (five) minutes as needed for chest pain.    SIGNIFICANT DIAGNOSTIC EXAMS  02-14-13: chest x-ray: slight right lower lobe atelectasis and mild cardiomegaly; the findings are improved from 09-11-12.   LABS REVIEWED:    09-21-12: glucose 125; bun 25; creat 1.26; k+ 4.4; na++ 140  02-08-13: wbc 11.5; hgb 11.5; hct 35.4; mcv 96.7 ;plt 260; glucose 174; bun 38; creat 1.67; k+ 5.1 Na++ 135; liver normal albumin 3.6; hgb a1c 7.5   Review of Systems  Unable to perform ROS    Physical Exam  Constitutional: He appears well-developed and well-nourished.  Neck: Neck supple. No JVD present. No thyromegaly present.  Cardiovascular: Normal rate and regular rhythm.   Respiratory: Effort normal.  GI: Soft. Bowel sounds are normal. He exhibits no distension. There is no tenderness.   Musculoskeletal:  Right aka left bka; full range of motion upper extremities  Neurological: He is alert.  Skin: Skin is warm and dry.  Psychiatric: He has a normal mood and affect.    ASSESSMENT/ PLAN:  Dyslipidemia Will continue the crestor 20 mg daily and the zetia 10 mg daily and will monitor  Essential hypertension, benign Will lower the vasotec to 2.5 mg daily and will have nursing check blood pressure twice daily will continue coreg 25 mg twice daily   PVD (peripheral vascular disease) Is status post right aka and left bka; will continue pain medications: ultram 50 mg every 6 hours as needed for mild pain and vicodin 5/325 mg 1-2 tabs every 6 hours as needed for moderate to severe pain and will monitor  Type II or unspecified type diabetes mellitus with peripheral circulatory disorders, uncontrolled(250.72) Will continue metformin 875 mg daily hgb a1c is 7.5 and will monitor status   CHF (congestive heart failure) Is stable will continue digoxin 0.125 mg daily; spironalactone 50 mg daily   CVA (cerebral vascular accident) Is stable will continue plavix 75 mg daily and asa 81 mg daily   CAD (coronary artery disease) Is stable will continue renexa er 500 mg twice daily asa 81 mg daily and plavix 75 mg daily

## 2013-03-20 NOTE — Assessment & Plan Note (Signed)
Is stable will continue digoxin 0.125 mg daily; spironalactone 50 mg daily

## 2013-03-20 NOTE — Assessment & Plan Note (Signed)
Will continue the crestor 20 mg daily and the zetia 10 mg daily and will monitor

## 2013-03-20 NOTE — Assessment & Plan Note (Signed)
Will lower the vasotec to 2.5 mg daily and will have nursing check blood pressure twice daily will continue coreg 25 mg twice daily

## 2013-03-22 ENCOUNTER — Non-Acute Institutional Stay (SKILLED_NURSING_FACILITY): Payer: Medicare Other | Admitting: Internal Medicine

## 2013-03-22 DIAGNOSIS — I509 Heart failure, unspecified: Secondary | ICD-10-CM

## 2013-03-22 DIAGNOSIS — N189 Chronic kidney disease, unspecified: Secondary | ICD-10-CM

## 2013-03-22 DIAGNOSIS — T460X1A Poisoning by cardiac-stimulant glycosides and drugs of similar action, accidental (unintentional), initial encounter: Secondary | ICD-10-CM

## 2013-03-22 DIAGNOSIS — T460X5A Adverse effect of cardiac-stimulant glycosides and drugs of similar action, initial encounter: Secondary | ICD-10-CM

## 2013-03-22 NOTE — Progress Notes (Signed)
Patient ID: Francisco Brock, male   DOB: 26-Oct-1935, 77 y.o.   MRN: [id number] Facility; [facility] Chief complaint; digoxin toxicity. History; the patient is a 77 year old man who has been in the facility since September 2013. He has a known ejection fraction of 25%. He is a ischemic cardiomyopathy with an implantable cardioverter defibrillator. He is on most of the appropriate medications luting ACE inhibitors, beta blockers, Aldactone. He is also on digoxin 0.125 mg daily. Last electrolytes ICU for June 6, showed potassium of 5.1, BUN of 30 a creatinine of 1.67. This her creatinine seems higher than what I can see is his baseline, he was 25 and 1.26 on 09/21/2012.   Review of systems; not really possible from this patient due to the language barrier however, in discussion with the nursing, staff. There've been no major issues.  Physical exam; pulse rate is in the upper 50s with some sinus pauses. Gen. he does not appear to be in any distress. Respiratory clear entry bilaterally. Cardiac other than sinus pauses. No murmurs. No signs of CHF.  Impression/plan Digoxin toxicity; his digoxin will be put on hold for 48 hours resumed at half the dose. I will check his level and his electrolytes on Monday. She appears stable for the moment. Chronic renal insufficiency; possibly worsening cardiorenal syndrome ?ACE. We'll recheck this on Monday. Will review his BP Significant ischemic cardiomyopathy; no evidence of congestive heart failure. Today. I am somewhat certain about his bradycardia. He is a noted pacer wire in the right ventricle. I am not completely certain who is following him from a cardiac point of view.

## 2013-05-13 ENCOUNTER — Non-Acute Institutional Stay (SKILLED_NURSING_FACILITY): Payer: Medicare Other | Admitting: Adult Health

## 2013-05-13 ENCOUNTER — Encounter: Payer: Self-pay | Admitting: Adult Health

## 2013-05-13 DIAGNOSIS — E1159 Type 2 diabetes mellitus with other circulatory complications: Secondary | ICD-10-CM

## 2013-05-13 DIAGNOSIS — I639 Cerebral infarction, unspecified: Secondary | ICD-10-CM

## 2013-05-13 DIAGNOSIS — I739 Peripheral vascular disease, unspecified: Secondary | ICD-10-CM

## 2013-05-13 DIAGNOSIS — I509 Heart failure, unspecified: Secondary | ICD-10-CM

## 2013-05-13 DIAGNOSIS — E785 Hyperlipidemia, unspecified: Secondary | ICD-10-CM

## 2013-05-13 DIAGNOSIS — I10 Essential (primary) hypertension: Secondary | ICD-10-CM

## 2013-05-13 DIAGNOSIS — I6350 Cerebral infarction due to unspecified occlusion or stenosis of unspecified cerebral artery: Secondary | ICD-10-CM

## 2013-05-13 NOTE — Assessment & Plan Note (Signed)
His ldl is stable and within goal; will continue crestor 20 mg daily and zetia 10 mg daily and will monitor

## 2013-05-13 NOTE — Progress Notes (Signed)
Patient ID: Francisco Brock, male   DOB: 12-04-1935, 77 y.o.   MRN: [id number]  [facility]  No Known Allergies   Chief Complaint  Patient presents with  . Medical Managment of Chronic Issues    HPI: He is being seen for the management of chronic illnesses. He was treated for dig toxicity in July of this year which has resolved. Overall his status remains stable there are no concerns being voiced by the nursing staff at this time; he is unable to fully participate in the hpi or ros.   Past Medical History  Diagnosis Date  . Coronary artery stenosis   . Subdural hematoma   . 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 Cardiac defibrillator in place   . CHF (congestive heart failure)   . Diabetes mellitus without complication   . Hypertension   . Cardiomyopathy   . Seizures   . Hyperlipemia   . Stroke   . Peripheral vascular disease   . COPD (chronic obstructive pulmonary disease)   . Shortness of breath   . Dysrhythmia     hx VT    Past Surgical History  Procedure Laterality Date  . Leg amputation above knee      right  . Leg amputation below knee      left  . Coronary artery bypass graft  2004  . Cardiac defibrillator placement  2007  . Balloon angioplasty, artery      rt lower leg  . Carotid endarterectomy      bilateral  . Amputation  06/28/2012    Procedure: AMPUTATION DIGIT;  Surgeon: Tami Ribas, MD;  Location: [facility] OR;  Service: Orthopedics;  Laterality: Left;  left  distal index finger    VITAL SIGNS BP 132/65  Pulse 70  Ht 4\' 9"  (1.448 m)  Wt 151 lb (68.493 kg)  BMI 32.67 kg/m2   Patient's Medications  New Prescriptions   No medications on file  Previous Medications   ASPIRIN 81 MG TABLET    Take 81 mg by mouth daily.   CARVEDILOL (COREG) 25 MG TABLET    Take 25 mg by mouth 2 (two) times daily with a meal.   CLOPIDOGREL (PLAVIX) 75 MG TABLET    Take 75 mg by mouth daily.   DIGOXIN (LANOXIN) 0.125 MG TABLET    Take 0.0625 mg by  mouth daily.    ENALAPRIL (VASOTEC) 5 MG TABLET    Take 0.5 tablets (2.5 mg total) by mouth daily.   EZETIMIBE (ZETIA) 10 MG TABLET    Take 10 mg by mouth daily.   HYDROCODONE-ACETAMINOPHEN (NORCO/VICODIN) 5-325 MG PER TABLET    Take 1-2 tablets by mouth every 6 (six) hours as needed. 1 or 2 tabs every 6 hours as needed   METFORMIN (GLUCOPHAGE) 500 MG TABLET    Take 850 mg by mouth daily.    MULTIPLE VITAMINS-MINERALS (MULTIVITAMIN WITH MINERALS) TABLET    Take 1 tablet by mouth daily.   NITROGLYCERIN (NITROSTAT) 0.4 MG SL TABLET    Place 0.4 mg under the tongue every 5 (five) minutes as needed for chest pain.   RANOLAZINE (RANEXA) 500 MG 12 HR TABLET    Take 500 mg by mouth 2 (two) times daily.   ROSUVASTATIN (CRESTOR) 40 MG TABLET    Take 20 mg by mouth at bedtime.    SPIRONOLACTONE (ALDACTONE) 50 MG TABLET    Take 50 mg by mouth daily.  TRAMADOL (ULTRAM) 50 MG TABLET    Take 50 mg by mouth every 6 (six) hours as needed for pain.  Modified Medications   No medications on file  Discontinued Medications   No medications on file    SIGNIFICANT DIAGNOSTIC EXAMS   02-14-13: chest x-ray: slight right lower lobe atelectasis and mild cardiomegaly; the findings are improved from 09-11-12.   LABS REVIEWED:    09-21-12: glucose 125; bun 25; creat 1.26; k+ 4.4; na++ 140  02-08-13: wbc 11.5; hgb 11.5; hct 35.4; mcv 96.7 ;plt 260; glucose 174; bun 38; creat 1.67; k+ 5.1 Na++ 135; liver normal albumin 3.6; hgb a1c 7.5  03-22-13: chol 76; ldl 30; trig 84; dig 3.1 03-26-13: glucose 158; bun 31; creat 1.53; k+5.3; na++ 133; dig 0.6  04-01-13: chol 79; ldl 29; trig 115; dig 0.9  Review of Systems  Unable to perform ROS    Physical Exam  Constitutional: He appears well-developed and well-nourished.  Neck: Neck supple. No JVD present. No thyromegaly present.  Cardiovascular: Normal rate and regular rhythm.   Respiratory: Effort normal.  GI: Soft. Bowel sounds are normal. He exhibits no distension.  There is no tenderness.  Musculoskeletal:  Right aka left bka; full range of motion upper extremities  Neurological: He is alert.  Skin: Skin is warm and dry.  Psychiatric: He has a normal mood and affect.     ASSESSMENT/ PLAN:  Dyslipidemia His ldl is stable and within goal; will continue crestor 20 mg daily and zetia 10 mg daily and will monitor   CHF (congestive heart failure) He is stable will continue spironolactone 50 mg daily and will continue digoxin 0.125 mg take 1/2 tab daily and will continue to monitor his status   CVA (cerebral vascular accident) He is without change in status will continue asa 81 mg daily and plavix 75 mg daily and will monitor   PVD (peripheral vascular disease) His pain is being adequately managed; is status post right aka an left bka; will continue ultram 50 mg every 6 hours as needed for mild pain and vicodin 5/325 mg 1 or 2 tabs every 6 hours as needed for more severe pain.   Type II or unspecified type diabetes mellitus with peripheral circulatory disorders, uncontrolled(250.72) He is presently stable will continue metfomin 875 mg daily and will monitor   Essential hypertension, benign He is presently stable will continue vasotec 2.5 mg daily and will continue coreg 25 mg twice daily and will monitor

## 2013-05-13 NOTE — Assessment & Plan Note (Signed)
He is stable will continue spironolactone 50 mg daily and will continue digoxin 0.125 mg take 1/2 tab daily and will continue to monitor his status

## 2013-05-13 NOTE — Assessment & Plan Note (Signed)
He is presently stable will continue vasotec 2.5 mg daily and will continue coreg 25 mg twice daily and will monitor

## 2013-05-13 NOTE — Assessment & Plan Note (Signed)
His pain is being adequately managed; is status post right aka an left bka; will continue ultram 50 mg every 6 hours as needed for mild pain and vicodin 5/325 mg 1 or 2 tabs every 6 hours as needed for more severe pain.

## 2013-05-13 NOTE — Assessment & Plan Note (Signed)
He is without change in status will continue asa 81 mg daily and plavix 75 mg daily and will monitor

## 2013-05-13 NOTE — Assessment & Plan Note (Signed)
He is presently stable will continue metfomin 875 mg daily and will monitor

## 2013-05-18 ENCOUNTER — Non-Acute Institutional Stay (SKILLED_NURSING_FACILITY): Payer: Medicare Other | Admitting: Internal Medicine

## 2013-05-18 DIAGNOSIS — I509 Heart failure, unspecified: Secondary | ICD-10-CM

## 2013-05-18 DIAGNOSIS — R05 Cough: Secondary | ICD-10-CM

## 2013-05-18 DIAGNOSIS — N62 Hypertrophy of breast: Secondary | ICD-10-CM

## 2013-05-29 NOTE — Progress Notes (Signed)
Patient ID: Francisco Brock, male   DOB: 1936-07-08, 77 y.o.   MRN: [id number]           PROGRESS NOTE  DATE:  05/17/2013  FACILITY: [facility]   LEVEL OF CARE:   SNF   Acute Visit   CHIEF COMPLAINT:  Cough.    HISTORY OF PRESENT ILLNESS:  This is a gentleman who was seen for review of a cough.  He is a gentleman who has a known cardiomyopathy with a history of congestive heart failure, status post CABG and cardiac defibrillator.  An echocardiogram on 05/07/2012 showed an ejection fraction of 25%.  REVIEW OF SYSTEMS:   CHEST/RESPIRATORY:  The patient is coughing, though I really do not get a sense of this.   It is not productive.  He is not complaining of shortness of breath.   CARDIAC:   No clear chest pain or edema.    PHYSICAL EXAMINATION:   VITAL SIGNS:   O2 SATURATIONS:   92% on room air.   PULSE:  66.   CHEST/RESPIRATORY:  Shallow, but otherwise clear air entry.   CARDIOVASCULAR:  CARDIAC:   Heart sounds are normal.  No murmurs.  His JVP is not elevated.   EDEMA/VARICOSITIES:  He has some edema in the coccyx and some edema in his above-knee amputation site, especially on the left.   BREASTS:   Noted marked gynecomastia which could be an aldactone  side effect.    ASSESSMENT/PLAN:  Ischemic cardiomyopathy with a prior ejection fraction of 25%.  He does not appear to be in any obvious congestive heart failure at the bedside.  He does, however, have some mild peripheral edema.  A BNP might be in order.     Cough.  I do not think this is his congestive heart failure.  He does not appear to be in respiratory distress.     Gynecomastia.  This very well could be an aldactone  side effect.  I will begin him on Eplerenone 25 mg daily.  Lab work to follow will include a BNP, a BMP, and a dig level.  I do not see a major issue right now with his other medications.    CPT CODE: 40981

## 2013-07-17 ENCOUNTER — Other Ambulatory Visit (HOSPITAL_COMMUNITY): Payer: Self-pay | Admitting: Orthopedic Surgery

## 2013-07-17 ENCOUNTER — Ambulatory Visit (HOSPITAL_COMMUNITY)
Admission: RE | Admit: 2013-07-17 | Discharge: 2013-07-17 | Disposition: A | Discharge status: Home / Self Care | Payer: PRIVATE HEALTH INSURANCE | Source: Ambulatory Visit | Attending: Vascular Surgery | Admitting: Vascular Surgery

## 2013-07-17 DIAGNOSIS — M7989 Other specified soft tissue disorders: Secondary | ICD-10-CM

## 2013-07-17 DIAGNOSIS — M79609 Pain in unspecified limb: Secondary | ICD-10-CM

## 2013-07-22 ENCOUNTER — Other Ambulatory Visit: Payer: Self-pay | Admitting: *Deleted

## 2013-07-22 MED ORDER — HYDROCODONE-HOMATROPINE 5-1.5 MG/5ML PO SYRP: 5.0000 mL | ORAL_SOLUTION | Freq: Four times a day (QID) | ORAL | Status: DC | PRN

## 2013-07-27 ENCOUNTER — Non-Acute Institutional Stay (SKILLED_NURSING_FACILITY): Payer: PRIVATE HEALTH INSURANCE | Admitting: Internal Medicine

## 2013-07-27 DIAGNOSIS — N183 Chronic kidney disease, stage 3 (moderate): Secondary | ICD-10-CM

## 2013-07-27 DIAGNOSIS — E1129 Type 2 diabetes mellitus with other diabetic kidney complication: Secondary | ICD-10-CM

## 2013-07-27 DIAGNOSIS — E1159 Type 2 diabetes mellitus with other circulatory complications: Secondary | ICD-10-CM

## 2013-07-27 DIAGNOSIS — I509 Heart failure, unspecified: Secondary | ICD-10-CM

## 2013-07-27 NOTE — Progress Notes (Signed)
Patient ID: Francisco Brock, male   DOB: [birth year], 77 y.o.   MRN: [id number] Facility; [facility] Chief complaint; history and physical, October 2014 transferred to [facility] History; this is a patient with multiple medical problems that i have not had much involvement with in the facility until recently. It would appear that he has been in the building since May of 2013. Previous to this he may have been at another local. Facility. He was followed for a period of time for left finger cellulitis and gangrene. Underwent a amputation of the left index finger. Most recent echocardiograms and CT scans of the head are as shown below  the skull through the vertex without contrast.    Comparison: 08/01/2010    Findings: Increased attenuation along the tentorium is again identified and on today's study had the appearance of calcified / thickened tentorium rather than blood. Generalized cerebral volume loss and moderate to severe chronic small vessel white matter ischemic changes are again noted.    Remote infarcts in the left cerebellum, bilateral basil ganglia and right occipital/posterior parietal regions again noted.    There is no evidence of acute infarction, hydrocephalus, extra- axial collection, midline shift or definite hemorrhage.    IMPRESSION: Increases attenuation along the tentorium is better visualized today to probably represent high density/calcified tentorium rather than blood.  Consider interval follow-up as clinically indicated.    Atrophy, chronic white matter disease and remote infarcts.     --------------------------------------------------------------------  Transthoracic Echocardiography   Patient:    Francisco Brock  MR #:       [id number]  Study Date: 08/02/2010  Gender:     M  Age:        74  Height:     165.1cm  Weight:     68kg  BSA:        1.7m^2  Pt. Status:  Room:    PERFORMING   [facility]   SONOGRAPHER  Cathie Beams   ATTENDING     Doristine Johns, Mc8   ORDERING     Bensimhon, Daniel  cc:   --------------------------------------------------------------------  Indications:   Atrial fibrillation - 427.31.   --------------------------------------------------------------------  History:  PMH: Status post fall. Pacemaker. CVA in 1998. Risk  factors: Hypertension. Diabetes mellitus. Dyslipidemia.   --------------------------------------------------------------------  Study Conclusions   - Left ventricle: The cavity size was mildly dilated. Wall thickness    was increased in a pattern of moderate LVH. Systolic function was    moderately to severely reduced. The estimated ejection fraction    was in the range of 30% to 35%. There is akinesis of the posterior    wall. There is hypokinesis of the inferolateral myocardium. There    is hypokinesis of the apical myocardium. Doppler parameters are    consistent with abnormal left ventricular relaxation (grade 1    diastolic dysfunction).  - Aortic valve: Mild regurgitation.  - Mitral valve: Calcified annulus. Mildly thickened leaflets .  - Left atrium: The atrium was mildly dilated.  - Pulmonary arteries: Systolic pressure was mildly increased. PA    peak pressure: 40mm Hg (S).   --------------------------------------------------------------------  Labs, prior tests, procedures, and surgery:  Coronary artery bypass grafting.  Transthoracic echocardiography. M-mode, complete 2D, spectral  Doppler, and color Doppler. Height: Height: 165.1cm. Height: 65in.  Weight: Weight: 68kg. Weight: 149.6lb. Body mass index: BMI:  24.9kg/m^2. Body surface area: BSA: 1.48m^2. Blood pressure: 144/34.  Patient status: Inpatient. Location: ICU/CCU   --------------------------------------------------------------------   --------------------------------------------------------------------  Left ventricle:  The cavity size was mildly dilated. Wall thickness  was increased in a pattern of  moderate LVH. Systolic function was  moderately to severely reduced. The estimated ejection fraction was  in the range of 30% to 35%. Regional wall motion abnormalities:  There is akinesis of the posterior wall. There is hypokinesis of the  inferolateral myocardium. There is hypokinesis of the apical  myocardium. Doppler parameters are consistent with abnormal left  ventricular relaxation (grade 1 diastolic dysfunction).   --------------------------------------------------------------------  Aortic valve: Trileaflet; mildly thickened leaflets. Mobility was  not restricted. Doppler: Transvalvular velocity was within the  normal range. There was no stenosis. Mild regurgitation.   --------------------------------------------------------------------  Aorta: Aortic root: The aortic root was normal in size.   --------------------------------------------------------------------  Mitral valve: Calcified annulus. Mildly thickened leaflets .  Mobility was not restricted. Doppler: Transvalvular velocity was  within the normal range. There was no evidence for stenosis. Trivial  regurgitation.  Peak gradient: 2mm Hg (D).   --------------------------------------------------------------------  Left atrium: The atrium was mildly dilated.   --------------------------------------------------------------------  Right ventricle: The cavity size was normal. Pacer wire or catheter  noted in right ventricle. Systolic function was normal.   --------------------------------------------------------------------  Pulmonic valve:  Doppler: Transvalvular velocity was within the  normal range. There was no evidence for stenosis.   --------------------------------------------------------------------  Tricuspid valve: Structurally normal valve. Doppler: Transvalvular  velocity was within the normal range. Mild regurgitation.   --------------------------------------------------------------------  Pulmonary  artery: Systolic pressure was mildly increased.   --------------------------------------------------------------------  Right atrium: The atrium was normal in size. Pacer wire or catheter  noted in right atrium.   --------------------------------------------------------------------  Pericardium: There was no pericardial effusion.   --------------------------------------------------------------------   2D measurements            Normal  Doppler measurements      Normal  Left ventricle                     Main pulmonary artery  LVID ED,      59.59 mm     43-52   Pressure, S      40 mm Hg =30  chord, PLAX                        Left ventricle  LVID ES,         54 mm     23-38   Ea, lat ann,    4.6 cm/s  -------  chord, PLAX                        tiss DP  FS, chord,        9 %      >29     E/Ea, lat     15.89       -------  PLAX                               ann, tiss DP  LVPW, ED        8.9 mm     ------  Ea, med ann,   3.37 cm/s  -------  IVS/LVPW       1.79        <1.3    tiss DP  ratio, ED  E/Ea, med     21.69       -------  Ventricular septum                 ann, tiss DP  IVS, ED       15.91 mm     ------  Aortic valve  Aorta                              Regurg peak     192 cm/s  -------  Root diam, ED    34 mm     ------  vel  Left atrium                        Regurg PHT      411 ms    -------  AP dim           42 mm     ------  Regurg peak      15 mm Hg -------  AP dim index   2.36 cm/m^2 <2.2    gradient                                     Mitral valve                                     Peak E vel     73.1 cm/s  -------                                     Peak A vel     68.1 cm/s  -------                                     Deceleration    197 ms    150-230                                     time                                     Peak              2 mm Hg -------                                     gradient, D                                      Peak E/A        1.1       -------                                     ratio  Tricuspid valve                                     Regurg peak     294 cm/s  -------                                     vel                                     Peak RV-RA       35 mm Hg -------                                     gradient, S                                     Systemic veins                                     Estimated CVP     5 mm Hg -------                                     Right ventricle                                     Pressure, S      40 mm Hg <30   --------------------------------------------------------------------  Prepared and Electronically Authenticated by   Olga Millers, MD, [facility]   Past Medical History  Diagnosis Date  . Coronary artery stenosis   . Subdural hematoma   . 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 Cardiac defibrillator in place   . CHF (congestive heart failure)   . Diabetes mellitus without complication   . Hypertension   . Cardiomyopathy   . Seizures   . Hyperlipemia   . Stroke   . Peripheral vascular disease   . COPD (chronic obstructive pulmonary disease)   . Shortness of breath   . Dysrhythmia     hx VT   Past Surgical History  Procedure Laterality Date  . Leg amputation above knee      right  . Leg amputation below knee      left  . Coronary artery bypass graft  2004  . Cardiac defibrillator placement  2007  . Balloon angioplasty, artery      rt lower leg  . Carotid endarterectomy      bilateral  . Amputation  06/28/2012    Procedure: AMPUTATION DIGIT;  Surgeon: Tami Ribas, MD;  Location: [facility] OR;  Service: Orthopedics;  Laterality: Left;  left  distal index finger   Current medications are reviewed; Norco 2 tablets every 6 hours when necessary, tramadol 50 mg every 6 hours when necessary, diphenhydramine 25 mg 2 tablets every 6 hours for congestion carvedilol 25 twice  a day,  Ranexa ER 500 one tablet twice a day digoxin 0.0625 daily, up eplerenone 25 mg a day, enteric-coated aspirin 81 mg a day, metformin 850 one tablet daily, Plavix 75 daily, multivitamin 1 tablet daily, Vasotec 2.5 daily, Zetia 10 mg daily, Crestor 20 mg daily,  Recent lab work; urinalysis is clear white count 12 hemoglobin 12 platelet count 253,000 differential shows 8% eosinophils 7.8% lymphocytes 66% granulocytes. BMP shows a BUN of 30 a creatinine of 1.37 digoxin level of 0.5. BNP of 261.6. All of his lab work in November. In July his LDL was 29 HDL of 27 triglycerides of 115   reports that he has quit smoking. His smoking use included Cigars. He does not have any smokeless tobacco history on file. He reports that he does not drink alcohol or use illicit drugs.  Review of systems Respiratory no clear shortness of breath Cardiac no clear exertional chest pain or palpitations Extremities no clear claudication  Physical examination Blood pressure 120/70, O2 sat 95% on room air respirations 16 pulse 64 Respiratory clear entry bilaterally Cardiac heart sounds are normal there is no signs of congestive heart failure JVP is not elevated. He has an implantable defibrillator and a history of coronary artery bypass Breasts; I believe his gynecomastia is much improved since changing from Aldactone  to eplerenone Extremities; he has very poor peripheral pulses even in his upper extremities he has faint radial pulses I was not able to feel his brachial pulses. Previous amputations in the left below-knee right above-knee and 2 fingers in his left hand   Impression/plan #1 ischemic cardiomyopathy which is severe but currently compensated. In Terms of his secondary prevention he is on a beta blocker ACE inhibitor, crestor and Zetia. And a potassium sparing diuretic I am not certain is at he is really needs the Zetia. #2 type 2 diabetes with severe peripheral vascular. Last hemoglobin A1c was 7.5 on 02/08/2013.  Continues on Plavix #3) type 2 diabetes with chronic renal insufficiency. #4 chronic renal insufficiency stage III #5 I think his gynecomastia is improved and I suspect most of this is due to Aldactone #6 hypertension #7 implantable cardiac defibrillator followed by [place] [facility]ology

## 2013-08-23 ENCOUNTER — Non-Acute Institutional Stay (SKILLED_NURSING_FACILITY): Payer: PRIVATE HEALTH INSURANCE | Admitting: Internal Medicine

## 2013-08-23 DIAGNOSIS — E1165 Type 2 diabetes mellitus with hyperglycemia: Secondary | ICD-10-CM

## 2013-08-23 DIAGNOSIS — E1129 Type 2 diabetes mellitus with other diabetic kidney complication: Secondary | ICD-10-CM

## 2013-08-23 DIAGNOSIS — I509 Heart failure, unspecified: Secondary | ICD-10-CM

## 2013-08-23 NOTE — Progress Notes (Signed)
Patient ID: Francisco Brock, male   DOB: Oct 05, 1935, 77 y.o.   MRN: [id number]  Facility; [facility] Chief complaint; history and physical. Routine [employer] visit for November History; this is a patient with multiple medical problems that i have not had much involvement with in the facility until recently. It would appear that he has been in the building since May of 2013. Previous to this he may have been at another local. Facility. He was followed for a period of time for left finger cellulitis and gangrene. Underwent a amputation of the left index finger. Most recent echocardiograms and CT scans of the head are as shown below. He apparently has been to see cardiology and refused his pacemaker battery chang    Comparison: 08/01/2010    Findings: Increased attenuation along the tentorium is again identified and on today's study had the appearance of calcified / thickened tentorium rather than blood. Generalized cerebral volume loss and moderate to severe chronic small vessel white matter ischemic changes are again noted.    Remote infarcts in the left cerebellum, bilateral basil ganglia and right occipital/posterior parietal regions again noted.    There is no evidence of acute infarction, hydrocephalus, extra- axial collection, midline shift or definite hemorrhage.    IMPRESSION: Increases attenuation along the tentorium is better visualized today to probably represent high density/calcified tentorium rather than blood.  Consider interval follow-up as clinically indicated.    Atrophy, chronic white matter disease and remote infarcts.     --------------------------------------------------------------------  Transthoracic Echocardiography   Patient:    Francisco Brock, Francisco Brock  MR #:       [id number]  Study Date: 08/02/2010  Gender:     M  Age:        74  Height:     165.1cm  Weight:     68kg  BSA:        1.81m^2  Pt. Status:  Room:    PERFORMING   [facility]   SONOGRAPHER  Cathie Beams   ATTENDING    Doristine Johns, Mc8   ORDERING     Bensimhon, Daniel  cc:   --------------------------------------------------------------------  Indications:   Atrial fibrillation - 427.31.   --------------------------------------------------------------------  History:  PMH: Status post fall. Pacemaker. CVA in 1998. Risk  factors: Hypertension. Diabetes mellitus. Dyslipidemia.   --------------------------------------------------------------------  Study Conclusions   - Left ventricle: The cavity size was mildly dilated. Wall thickness    was increased in a pattern of moderate LVH. Systolic function was    moderately to severely reduced. The estimated ejection fraction    was in the range of 30% to 35%. There is akinesis of the posterior    wall. There is hypokinesis of the inferolateral myocardium. There    is hypokinesis of the apical myocardium. Doppler parameters are    consistent with abnormal left ventricular relaxation (grade 1    diastolic dysfunction).  - Aortic valve: Mild regurgitation.  - Mitral valve: Calcified annulus. Mildly thickened leaflets .  - Left atrium: The atrium was mildly dilated.  - Pulmonary arteries: Systolic pressure was mildly increased. PA    peak pressure: 40mm Hg (S).   --------------------------------------------------------------------  Labs, prior tests, procedures, and surgery:  Coronary artery bypass grafting.  Transthoracic echocardiography. M-mode, complete 2D, spectral  Doppler, and color Doppler. Height: Height: 165.1cm. Height: 65in.  Weight: Weight: 68kg. Weight: 149.6lb. Body mass index: BMI:  24.9kg/m^2. Body surface area: BSA: 1.71m^2. Blood pressure: 144/34.  Patient status: Inpatient. Location: ICU/CCU   --------------------------------------------------------------------   --------------------------------------------------------------------  Left ventricle: The cavity size was mildly dilated. Wall thickness  was increased  in a pattern of moderate LVH. Systolic function was  moderately to severely reduced. The estimated ejection fraction was  in the range of 30% to 35%. Regional wall motion abnormalities:  There is akinesis of the posterior wall. There is hypokinesis of the  inferolateral myocardium. There is hypokinesis of the apical  myocardium. Doppler parameters are consistent with abnormal left  ventricular relaxation (grade 1 diastolic dysfunction).   --------------------------------------------------------------------  Aortic valve: Trileaflet; mildly thickened leaflets. Mobility was  not restricted. Doppler: Transvalvular velocity was within the  normal range. There was no stenosis. Mild regurgitation.   --------------------------------------------------------------------  Aorta: Aortic root: The aortic root was normal in size.   --------------------------------------------------------------------  Mitral valve: Calcified annulus. Mildly thickened leaflets .  Mobility was not restricted. Doppler: Transvalvular velocity was  within the normal range. There was no evidence for stenosis. Trivial  regurgitation.  Peak gradient: 2mm Hg (D).   --------------------------------------------------------------------  Left atrium: The atrium was mildly dilated.   --------------------------------------------------------------------  Right ventricle: The cavity size was normal. Pacer wire or catheter  noted in right ventricle. Systolic function was normal.   --------------------------------------------------------------------  Pulmonic valve:  Doppler: Transvalvular velocity was within the  normal range. There was no evidence for stenosis.   --------------------------------------------------------------------  Tricuspid valve: Structurally normal valve. Doppler: Transvalvular  velocity was within the normal range. Mild regurgitation.   --------------------------------------------------------------------   Pulmonary artery: Systolic pressure was mildly increased.   --------------------------------------------------------------------  Right atrium: The atrium was normal in size. Pacer wire or catheter  noted in right atrium.   --------------------------------------------------------------------  Pericardium: There was no pericardial effusion.   --------------------------------------------------------------------   2D measurements            Normal  Doppler measurements      Normal  Left ventricle                     Main pulmonary artery  LVID ED,      59.59 mm     43-52   Pressure, S      40 mm Hg =30  chord, PLAX                        Left ventricle  LVID ES,         54 mm     23-38   Ea, lat ann,    4.6 cm/s  -------  chord, PLAX                        tiss DP  FS, chord,        9 %      >29     E/Ea, lat     15.89       -------  PLAX                               ann, tiss DP  LVPW, ED        8.9 mm     ------  Ea, med ann,   3.37 cm/s  -------  IVS/LVPW       1.79        <1.3    tiss DP  ratio, ED  E/Ea, med     21.69       -------  Ventricular septum                 ann, tiss DP  IVS, ED       15.91 mm     ------  Aortic valve  Aorta                              Regurg peak     192 cm/s  -------  Root diam, ED    34 mm     ------  vel  Left atrium                        Regurg PHT      411 ms    -------  AP dim           42 mm     ------  Regurg peak      15 mm Hg -------  AP dim index   2.36 cm/m^2 <2.2    gradient                                     Mitral valve                                     Peak E vel     73.1 cm/s  -------                                     Peak A vel     68.1 cm/s  -------                                     Deceleration    197 ms    150-230                                     time                                     Peak              2 mm Hg -------                                     gradient, D                                      Peak E/A        1.1       -------                                     ratio  Tricuspid valve                                     Regurg peak     294 cm/s  -------                                     vel                                     Peak RV-RA       35 mm Hg -------                                     gradient, S                                     Systemic veins                                     Estimated CVP     5 mm Hg -------                                     Right ventricle                                     Pressure, S      40 mm Hg <30   --------------------------------------------------------------------  Prepared and Electronically Authenticated by   Olga Millers, MD, [facility]   Past Medical History  Diagnosis Date  . Coronary artery stenosis   . Subdural hematoma   . 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 Cardiac defibrillator in place   . CHF (congestive heart failure)   . Diabetes mellitus without complication   . Hypertension   . Cardiomyopathy   . Seizures   . Hyperlipemia   . Stroke   . Peripheral vascular disease   . COPD (chronic obstructive pulmonary disease)   . Shortness of breath   . Dysrhythmia     hx VT   Past Surgical History  Procedure Laterality Date  . Leg amputation above knee      right  . Leg amputation below knee      left  . Coronary artery bypass graft  2004  . Cardiac defibrillator placement  2007  . Balloon angioplasty, artery      rt lower leg  . Carotid endarterectomy      bilateral  . Amputation  06/28/2012    Procedure: AMPUTATION DIGIT;  Surgeon: Tami Ribas, MD;  Location: [facility] OR;  Service: Orthopedics;  Laterality: Left;  left  distal index finger   Current medications are reviewed; Norco 2 tablets every 6 hours when necessary, tramadol 50 mg every 6 hours when necessary, diphenhydramine 25 mg 2 tablets every 6 hours for congestion carvedilol 25  twice a day, Ranexa ER 500 one tablet twice a day digoxin 0.0625 daily, up eplerenone 25 mg a day, enteric-coated aspirin 81 mg a day, metformin 850 one tablet daily, Plavix 75 daily, multivitamin 1 tablet daily, Vasotec 2.5 daily, Zetia 10 mg daily, Crestor 20 mg daily,  Recent lab work; urinalysis is clear white count 12 hemoglobin 12 platelet count 253,000 differential shows 8% eosinophils 7.8% lymphocytes 66% granulocytes. BMP shows a BUN of 30 a creatinine of 1.37 digoxin level of 0.5. BNP of 261.6. All of his lab work in November. In July his LDL was 29 HDL of 27 triglycerides of 115   reports that he has quit smoking. His smoking use included Cigars. He does not have any smokeless tobacco history on file. He reports that he does not drink alcohol or use illicit drugs.  Review of systems Respiratory no clear shortness of breath Cardiac no clear exertional chest pain or palpitations Extremities no clear claudication  Physical examination Respiratory clear entry bilaterally Cardiac heart sounds are normal there is no signs of congestive heart failure JVP is not elevated. He has an implantable defibrillator and a history of coronary artery bypass Breasts; I believe his gynecomastia is much improved since changing from Aldactone  to eplerenone Extremities; he has very poor peripheral pulses even in his upper extremities he has faint radial pulses I was not able to feel his brachial pulses. Previous amputations in the left below-knee right above-knee and 2 fingers in his left hand   Impression/plan #1 ischemic cardiomyopathy which is severe but currently compensated. In Terms of his secondary prevention he is on a beta blocker ACE inhibitor, crestor and Zetia. And a potassium sparing diuretic I am not certain is at he is really needs the Zetia. #2 type 2 diabetes with severe peripheral vascular. Last hemoglobin A1c was 7.5 on 02/08/2013. Continues on Plavix #3) type 2 diabetes with chronic renal  insufficiency. #4 chronic renal insufficiency stage III #5 I think his gynecomastia is improved and I suspect most of this is due to Aldactone #6 hypertension #7 implantable cardiac defibri Not sure of his capacity to refuse the battery changes to his implantable defibrillator I will discuss further

## 2013-10-05 ENCOUNTER — Non-Acute Institutional Stay (SKILLED_NURSING_FACILITY): Payer: PRIVATE HEALTH INSURANCE | Admitting: Internal Medicine

## 2013-10-05 DIAGNOSIS — I509 Heart failure, unspecified: Secondary | ICD-10-CM

## 2013-10-05 DIAGNOSIS — E1165 Type 2 diabetes mellitus with hyperglycemia: Secondary | ICD-10-CM

## 2013-10-05 DIAGNOSIS — E1129 Type 2 diabetes mellitus with other diabetic kidney complication: Secondary | ICD-10-CM

## 2013-10-05 NOTE — Progress Notes (Signed)
Patient ID: Francisco Brock, male   DOB: 01-09-1936, 78 y.o.   MRN: [id number]  Facility; [facility] Chief complaint; history and physical. Routine [employer] visit for December History; this is a patient with multiple medical problems that i have not had much involvement with in the facility until recently. It would appear that he has been in the building since May of 2013. Previous to this he may have been at another local. Facility. He was followed for a period of time for left finger cellulitis and gangrene. Underwent a amputation of the left index finger. Most recent echocardiograms and CT scans of the head are as shown below. He apparently has been to see cardiology and refused his pacemaker battery change    Comparison: 08/01/2010    Findings: Increased attenuation along the tentorium is again identified and on today's study had the appearance of calcified / thickened tentorium rather than blood. Generalized cerebral volume loss and moderate to severe chronic small vessel white matter ischemic changes are again noted.    Remote infarcts in the left cerebellum, bilateral basil ganglia and right occipital/posterior parietal regions again noted.    There is no evidence of acute infarction, hydrocephalus, extra- axial collection, midline shift or definite hemorrhage.    IMPRESSION: Increases attenuation along the tentorium is better visualized today to probably represent high density/calcified tentorium rather than blood.  Consider interval follow-up as clinically indicated.    Atrophy, chronic white matter disease and remote infarcts.     --------------------------------------------------------------------  Transthoracic Echocardiography   Patient:    Francisco Brock  MR #:       [id number]  Study Date: 08/02/2010  Gender:     M  Age:        74  Height:     165.1cm  Weight:     68kg  BSA:        1.9m^2  Pt. Status:  Room:    PERFORMING   [facility]   SONOGRAPHER   Cathie Beams   ATTENDING    Doristine Johns, Mc8   ORDERING     Bensimhon, Daniel  cc:   --------------------------------------------------------------------  Indications:   Atrial fibrillation - 427.31.   --------------------------------------------------------------------  History:  PMH: Status post fall. Pacemaker. CVA in 1998. Risk  factors: Hypertension. Diabetes mellitus. Dyslipidemia.   --------------------------------------------------------------------  Study Conclusions   - Left ventricle: The cavity size was mildly dilated. Wall thickness    was increased in a pattern of moderate LVH. Systolic function was    moderately to severely reduced. The estimated ejection fraction    was in the range of 30% to 35%. There is akinesis of the posterior    wall. There is hypokinesis of the inferolateral myocardium. There    is hypokinesis of the apical myocardium. Doppler parameters are    consistent with abnormal left ventricular relaxation (grade 1    diastolic dysfunction).  - Aortic valve: Mild regurgitation.  - Mitral valve: Calcified annulus. Mildly thickened leaflets .  - Left atrium: The atrium was mildly dilated.  - Pulmonary arteries: Systolic pressure was mildly increased. PA    peak pressure: 40mm Hg (S).   --------------------------------------------------------------------  Labs, prior tests, procedures, and surgery:  Coronary artery bypass grafting.  Transthoracic echocardiography. M-mode, complete 2D, spectral  Doppler, and color Doppler. Height: Height: 165.1cm. Height: 65in.  Weight: Weight: 68kg. Weight: 149.6lb. Body mass index: BMI:  24.9kg/m^2. Body surface area: BSA: 1.64m^2. Blood pressure: 144/34.  Patient status: Inpatient. Location: ICU/CCU   --------------------------------------------------------------------   --------------------------------------------------------------------  Left ventricle: The cavity size was mildly dilated. Wall thickness  was  increased in a pattern of moderate LVH. Systolic function was  moderately to severely reduced. The estimated ejection fraction was  in the range of 30% to 35%. Regional wall motion abnormalities:  There is akinesis of the posterior wall. There is hypokinesis of the  inferolateral myocardium. There is hypokinesis of the apical  myocardium. Doppler parameters are consistent with abnormal left  ventricular relaxation (grade 1 diastolic dysfunction).   --------------------------------------------------------------------  Aortic valve: Trileaflet; mildly thickened leaflets. Mobility was  not restricted. Doppler: Transvalvular velocity was within the  normal range. There was no stenosis. Mild regurgitation.   --------------------------------------------------------------------  Aorta: Aortic root: The aortic root was normal in size.   --------------------------------------------------------------------  Mitral valve: Calcified annulus. Mildly thickened leaflets .  Mobility was not restricted. Doppler: Transvalvular velocity was  within the normal range. There was no evidence for stenosis. Trivial  regurgitation.  Peak gradient: 2mm Hg (D).   --------------------------------------------------------------------  Left atrium: The atrium was mildly dilated.   --------------------------------------------------------------------  Right ventricle: The cavity size was normal. Pacer wire or catheter  noted in right ventricle. Systolic function was normal.   --------------------------------------------------------------------  Pulmonic valve:  Doppler: Transvalvular velocity was within the  normal range. There was no evidence for stenosis.   --------------------------------------------------------------------  Tricuspid valve: Structurally normal valve. Doppler: Transvalvular  velocity was within the normal range. Mild regurgitation.    --------------------------------------------------------------------  Pulmonary artery: Systolic pressure was mildly increased.   --------------------------------------------------------------------  Right atrium: The atrium was normal in size. Pacer wire or catheter  noted in right atrium.   --------------------------------------------------------------------  Pericardium: There was no pericardial effusion.   --------------------------------------------------------------------   2D measurements            Normal  Doppler measurements      Normal  Left ventricle                     Main pulmonary artery  LVID ED,      59.59 mm     43-52   Pressure, S      40 mm Hg =30  chord, PLAX                        Left ventricle  LVID ES,         54 mm     23-38   Ea, lat ann,    4.6 cm/s  -------  chord, PLAX                        tiss DP  FS, chord,        9 %      >29     E/Ea, lat     15.89       -------  PLAX                               ann, tiss DP  LVPW, ED        8.9 mm     ------  Ea, med ann,   3.37 cm/s  -------  IVS/LVPW       1.79        <1.3    tiss DP  ratio, ED  E/Ea, med     21.69       -------  Ventricular septum                 ann, tiss DP  IVS, ED       15.91 mm     ------  Aortic valve  Aorta                              Regurg peak     192 cm/s  -------  Root diam, ED    34 mm     ------  vel  Left atrium                        Regurg PHT      411 ms    -------  AP dim           42 mm     ------  Regurg peak      15 mm Hg -------  AP dim index   2.36 cm/m^2 <2.2    gradient                                     Mitral valve                                     Peak E vel     73.1 cm/s  -------                                     Peak A vel     68.1 cm/s  -------                                     Deceleration    197 ms    150-230                                     time                                     Peak              2 mm Hg -------                                      gradient, D                                     Peak E/A        1.1       -------                                     ratio  Tricuspid valve                                     Regurg peak     294 cm/s  -------                                     vel                                     Peak RV-RA       35 mm Hg -------                                     gradient, S                                     Systemic veins                                     Estimated CVP     5 mm Hg -------                                     Right ventricle                                     Pressure, S      40 mm Hg <30   --------------------------------------------------------------------  Prepared and Electronically Authenticated by   Olga Millers, MD, [facility]   Past Medical History  Diagnosis Date  . Coronary artery stenosis   . Subdural hematoma   . 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 Cardiac defibrillator in place   . CHF (congestive heart failure)   . Diabetes mellitus without complication   . Hypertension   . Cardiomyopathy   . Seizures   . Hyperlipemia   . Stroke   . Peripheral vascular disease   . COPD (chronic obstructive pulmonary disease)   . Shortness of breath   . Dysrhythmia     hx VT   Past Surgical History  Procedure Laterality Date  . Leg amputation above knee      right  . Leg amputation below knee      left  . Coronary artery bypass graft  2004  . Cardiac defibrillator placement  2007  . Balloon angioplasty, artery      rt lower leg  . Carotid endarterectomy      bilateral  . Amputation  06/28/2012    Procedure: AMPUTATION DIGIT;  Surgeon: Tami Ribas, MD;  Location: [facility] OR;  Service: Orthopedics;  Laterality: Left;  left  distal index finger   Recent lab work; urinalysis is clear white count 12 hemoglobin 12 platelet count 253,000 differential shows 8% eosinophils 7.8%  lymphocytes 66% granulocytes. BMP shows a BUN of 30 a creatinine of 1.37  digoxin level of 0.5. BNP of 261.6. All of his lab work in November. In July his LDL was 29 HDL of 27 triglycerides of 115   reports that he has quit smoking. His smoking use included Cigars. He does not have any smokeless tobacco history on file. He reports that he does not drink alcohol or use illicit drugs.  Review of systems Respiratory no clear shortness of breath Cardiac no clear exertional chest pain or palpitations Extremities no clear claudication  Physical examination temperature is 98.4, pulse 64, respirations 18 blood pressure 145/58 weight is 253.6 pounds which is stable. O2 sat is 97% on room Respiratory clear entry bilaterally Cardiac heart sounds are normal there is no signs of congestive heart failure JVP is not elevated. He has an implantable defibrillator and a history of coronary artery bypass Breasts; I believe his gynecomastia is much improved since changing from Aldactone  to eplerenone Extremities; he has very poor peripheral pulses even in his upper extremities he has faint radial pulses I was not able to feel his brachial pulses. Previous amputations in the left below-knee right above-knee and 2 fingers in his left hand   Impression/plan #1 ischemic cardiomyopathy which is severe but currently compensated. In Terms of his secondary prevention he is on a beta blocker ACE inhibitor, crestor and Zetia. And a potassium sparing diuretic I am not certain is at he is really needs the Zetia. #2 type 2 diabetes with severe peripheral vascular. Last hemoglobin A1c was 7.5 on 02/08/2013. Continues on Plavix #3) type 2 diabetes with chronic renal insufficiency. #4 chronic renal insufficiency stage III #5 I think his gynecomastia is improved and I suspect most of this is due to Aldactone #6 hypertension #7 implantable cardiac defibri Not sure of his capacity to refuse the battery changes to his implantable  defibrillator I will discuss further

## 2013-10-07 ENCOUNTER — Telehealth (HOSPITAL_COMMUNITY): Payer: Self-pay | Admitting: Interventional Radiology

## 2013-10-07 NOTE — Telephone Encounter (Signed)
Called pt's daughter Maxine Glenn(Monica), left VM for her to call to schedule consultation for her dad. JM

## 2013-10-09 ENCOUNTER — Other Ambulatory Visit (HOSPITAL_COMMUNITY): Payer: Self-pay | Admitting: Interventional Radiology

## 2013-10-09 DIAGNOSIS — I6523 Occlusion and stenosis of bilateral carotid arteries: Secondary | ICD-10-CM

## 2013-10-14 ENCOUNTER — Ambulatory Visit (HOSPITAL_COMMUNITY)
Admission: RE | Admit: 2013-10-14 | Discharge: 2013-10-14 | Disposition: A | Discharge status: Home / Self Care | Payer: PRIVATE HEALTH INSURANCE | Source: Ambulatory Visit | Attending: Interventional Radiology | Admitting: Interventional Radiology

## 2013-10-14 DIAGNOSIS — I6523 Occlusion and stenosis of bilateral carotid arteries: Secondary | ICD-10-CM

## 2013-11-11 ENCOUNTER — Other Ambulatory Visit (HOSPITAL_COMMUNITY): Payer: Self-pay | Admitting: Interventional Radiology

## 2013-11-11 DIAGNOSIS — I6350 Cerebral infarction due to unspecified occlusion or stenosis of unspecified cerebral artery: Secondary | ICD-10-CM

## 2013-11-11 DIAGNOSIS — I2510 Atherosclerotic heart disease of native coronary artery without angina pectoris: Secondary | ICD-10-CM

## 2013-11-11 DIAGNOSIS — I739 Peripheral vascular disease, unspecified: Secondary | ICD-10-CM

## 2013-11-12 ENCOUNTER — Non-Acute Institutional Stay (SKILLED_NURSING_FACILITY): Payer: PRIVATE HEALTH INSURANCE | Admitting: Internal Medicine

## 2013-11-12 DIAGNOSIS — I509 Heart failure, unspecified: Secondary | ICD-10-CM

## 2013-11-12 DIAGNOSIS — E1159 Type 2 diabetes mellitus with other circulatory complications: Secondary | ICD-10-CM

## 2013-11-13 NOTE — Progress Notes (Addendum)
Patient ID: Francisco Brock, male   DOB: Jun 23, 1936, 78 y.o.   MRN: [id number]                  PROGRESS NOTE  DATE:  11/12/2013    FACILITY: [facility]    LEVEL OF CARE:   SNF   Acute Visit   HISTORY OF PRESENT ILLNESS:  Francisco Brock is a 78 year-old who I follow along with the [employer] nurse practitioner in the facility.  He has multiple medical problems.  He transferred to my care late in 2014.    He has a history of multi-infarct state involving the left cerebellum, bilateral basal ganglia, and right occipital posterior parietal regions.    He also has a history of an ischemic cardiomyopathy with an ejection fraction of 30-35%.    His recent issues have been stable.  He is not having overt coronary artery disease.    He has bilateral gynecomastia.  I stopped his aldactone some months ago and this may have improved somewhat, but certainly not resolved.    I note [facility] that he is scheduled for angiogram of his carotids on 11/25/2013.  This is through Dr. Corliss Skains.  I really did not know anything about this, nor am I completely certain of how this came about.  He has not had any recent medical issues that I am aware of.    He did refuse to have his pacemaker battery changed on his last visit to Cardiology.  I am really not certain of his capacity to do this.  Nevertheless, they did not schedule him for changing and it really has not been an issue.    LABORATORY DATA:  Last lab work on September 24, 2013 revealed normal BUN and creatinine at 22 and 1.3, respectively.  Potassium was 4.2.  His glucose was 286.    Liver function tests on 10/26/2013 were normal.  His albumin was 3.2.    Last cholesterol check on 08/23/2013 revealed an LDL of 45.    PAST MEDICAL HISTORY:  Reviewed.    PAST SURGICAL HISTORY:  Reviewed.    CURRENT MEDICATIONS:  Medication list is reviewed.     Tramadol 50 mg q.6 p.r.n.    Diphenhydramine 25, 2 tablets q.6 p.r.n.     Norco  5/325, 1 tablet q.6 h p.r.n.    Carvedilol 25 b.i.d.    Ranexa ER 500 twice a day.    Eplerenone 25 mg q.d.    ASA 81 q.d.    Metformin 850 daily.    Plavix 75 q.d.    Vasotec 2.5 q.d.    Vitamin D3, 50,000 U monthly.    Crestor 20 q.d.    REVIEW OF SYSTEMS:      CHEST/RESPIRATORY:  As far as I can tell, he has no exertional shortness of breath.   CARDIAC:   No chest pain or palpitations.   GI:  No abdominal pain.    PHYSICAL EXAMINATION:   VITAL SIGNS:   TEMPERATURE:  97.    PULSE:  76.   RESPIRATIONS:  20.   BLOOD PRESSURE:  130/64.   WEIGHT:  152 pounds, which is mildly increased.   O2 SATURATIONS:   97%.   CHEST/RESPIRATORY:  Clear air entry bilaterally.   CARDIOVASCULAR:  CARDIAC:   Heart sounds are normal.  There is no S3.  No increase in jugular venous pressure.   EDEMA/VARICOSITIES:  Minimal edema.   GASTROINTESTINAL:  ABDOMEN:   Distended.  LIVER/SPLEEN/KIDNEYS:  No liver, no spleen.  No tenderness.    ASSESSMENT/PLAN:  Ischemic cardiomyopathy.  As far as I can tell, this is stable.    History of cerebral infarctions.  On Plavix.    History of peripheral vascular disease in the setting of type 2 diabetes.   He has bilateral amputations.    Type 2 diabetes with nephropathy.    Most recent hemoglobin A1c was 7.2.   Chronic renal disease stage III.  His creatinine clearance is 48.      I was somewhat perplexed  about the angiogram arrangements, which appear to be for 11/25/2013. I can't see the exact documentation  [facility].  She has not heard anything about this, either, and will contact the family members.    CPT CODE: [id number]

## 2013-11-19 ENCOUNTER — Other Ambulatory Visit: Payer: Self-pay | Admitting: Radiology

## 2013-11-19 ENCOUNTER — Encounter (HOSPITAL_COMMUNITY): Payer: Self-pay | Admitting: Pharmacy Technician

## 2013-11-22 ENCOUNTER — Encounter (HOSPITAL_COMMUNITY)
Admission: RE | Admit: 2013-11-22 | Discharge: 2013-11-22 | Disposition: A | Discharge status: Home / Self Care | Payer: PRIVATE HEALTH INSURANCE | Source: Ambulatory Visit | Attending: Interventional Radiology | Admitting: Interventional Radiology

## 2013-11-22 ENCOUNTER — Ambulatory Visit (HOSPITAL_COMMUNITY)
Admission: RE | Admit: 2013-11-22 | Discharge: 2013-11-22 | Disposition: A | Discharge status: Home / Self Care | Payer: PRIVATE HEALTH INSURANCE | Source: Ambulatory Visit | Attending: Anesthesiology | Admitting: Anesthesiology

## 2013-11-22 ENCOUNTER — Encounter (HOSPITAL_COMMUNITY): Payer: Self-pay

## 2013-11-22 DIAGNOSIS — Z01818 Encounter for other preprocedural examination: Secondary | ICD-10-CM | POA: Insufficient documentation

## 2013-11-22 DIAGNOSIS — Z01810 Encounter for preprocedural cardiovascular examination: Secondary | ICD-10-CM | POA: Insufficient documentation

## 2013-11-22 DIAGNOSIS — Z01812 Encounter for preprocedural laboratory examination: Secondary | ICD-10-CM | POA: Insufficient documentation

## 2013-11-22 HISTORY — DX: Acute embolism and thrombosis of unspecified deep veins of unspecified lower extremity: I82.409

## 2013-11-22 HISTORY — DX: Personal history of other medical treatment: Z92.89

## 2013-11-22 HISTORY — DX: Presence of automatic (implantable) cardiac defibrillator: Z95.810

## 2013-11-22 HISTORY — DX: Complete traumatic metacarpophalangeal amputation of unspecified finger, initial encounter: S68.119A

## 2013-11-22 LAB — CBC WITH DIFFERENTIAL/PLATELET
Basophils Absolute: 0 10*3/uL (ref 0.0–0.1)
Basophils Relative: 0 % (ref 0–1)
EOS PCT: 5 % (ref 0–5)
Eosinophils Absolute: 0.5 10*3/uL (ref 0.0–0.7)
HCT: 35.5 % — ABNORMAL LOW (ref 39.0–52.0)
HEMOGLOBIN: 11.9 g/dL — AB (ref 13.0–17.0)
LYMPHS ABS: 2.3 10*3/uL (ref 0.7–4.0)
Lymphocytes Relative: 22 % (ref 12–46)
MCH: 31.3 pg (ref 26.0–34.0)
MCHC: 33.5 g/dL (ref 30.0–36.0)
MCV: 93.4 fL (ref 78.0–100.0)
Monocytes Absolute: 0.9 10*3/uL (ref 0.1–1.0)
Monocytes Relative: 8 % (ref 3–12)
Neutro Abs: 6.7 10*3/uL (ref 1.7–7.7)
Neutrophils Relative %: 64 % (ref 43–77)
PLATELETS: 249 10*3/uL (ref 150–400)
RBC: 3.8 MIL/uL — AB (ref 4.22–5.81)
RDW: 15.8 % — ABNORMAL HIGH (ref 11.5–15.5)
WBC: 10.3 10*3/uL (ref 4.0–10.5)

## 2013-11-22 LAB — PLATELET INHIBITION P2Y12: PLATELET FUNCTION P2Y12: 229 [PRU] (ref 194–418)

## 2013-11-22 LAB — COMPREHENSIVE METABOLIC PANEL
ALK PHOS: 68 U/L (ref 39–117)
ALT: 9 U/L (ref 0–53)
AST: 15 U/L (ref 0–37)
Albumin: 3.3 g/dL — ABNORMAL LOW (ref 3.5–5.2)
BUN: 22 mg/dL (ref 6–23)
CO2: 19 meq/L (ref 19–32)
Calcium: 9.2 mg/dL (ref 8.4–10.5)
Chloride: 103 mEq/L (ref 96–112)
Creatinine, Ser: 1.21 mg/dL (ref 0.50–1.35)
GFR calc non Af Amer: 57 mL/min — ABNORMAL LOW (ref >90)
GFR, EST AFRICAN AMERICAN: 66 mL/min — AB (ref >90)
GLUCOSE: 171 mg/dL — AB (ref 70–99)
POTASSIUM: 5.2 meq/L (ref 3.7–5.3)
Sodium: 137 mEq/L (ref 137–147)
Total Bilirubin: 0.4 mg/dL (ref 0.3–1.2)
Total Protein: 7.8 g/dL (ref 6.0–8.3)

## 2013-11-22 LAB — PROTIME-INR
INR: 1.17 (ref 0.00–1.49)
Prothrombin Time: 14.7 seconds (ref 11.6–15.2)

## 2013-11-22 LAB — APTT: aPTT: 68 seconds — ABNORMAL HIGH (ref 24–37)

## 2013-11-22 NOTE — Progress Notes (Signed)
11/22/13 1433  OBSTRUCTIVE SLEEP APNEA  Have you ever been diagnosed with sleep apnea through a sleep study? No  Do you snore loudly (loud enough to be heard through closed doors)?  0  Do you often feel tired, fatigued, or sleepy during the daytime? 1  Has anyone observed you stop breathing during your sleep? 1  Do you have, or are you being treated for high blood pressure? 1  BMI more than 35 kg/m2? 0  Age over 76 years old? 1  Neck circumference greater than 40 cm/18 inches? 0 (17)  Gender: 1  Obstructive Sleep Apnea Score 5  Score 4 or greater  Results sent to PCP

## 2013-11-22 NOTE — Progress Notes (Addendum)
Anesthesia PAT Evaluation:  Patient is a 78 year old male scheduled for cerebra; angiography with left ICA angioplasty/possible stent on 11/28/13 by Dr. Corliss Skains. He has known carotid artery disease having undergone prior bilateral carotid endarterectomies.  Routine surveillance showed a increased stenosis of the proximal ICA as well as more distal disease.  CTA showed a a severe tandem stenosis of the (L)ICA, as well as stenosis of the left cavernous and supra clinoid ICA junction.  History includes CVA '98 without left hemiparesis and expressive aphasia (CVA was related to LE DVT), bilateral carotid endarterectomies, small SDH after fall '11, former smoker, CAD s/p CABG X 5 '04 ([place]), cardiomyopathy ("apparent ischemic etiology" according to cardiology notes) with chronic systolic CHF, ventricular tachycardia s/p [employer] ICD '07 (nearing time for elective replacement; however as of 08/12/13 cardiology discussed end of life wishes and "he again expressed that he would rather have a lethal arrhythmia and pass away than to be shocked by a defibrillator or at least not have the surgery to have such an option. 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  Patient is a resident at [facility].  His wife and daughter Maxine Glenn are with him today at PAT.  Patient is not very verbal, but he is cooperative.  Wife and daughter state this is his usual state of health.    His Cardiologist is Dr. Theodoro Grist with [facility].  He cleared patient for this procedure from a cardiac standpoint.  He denied any new chest pain or SOB.  He is a bilateral LE amputee in a wheel chair.  On exam, he did not appear to be in any acute distress.  Heart RRR,  somewhat distant, II-III SEM.  Lungs sounded slightly course at the bases but otherwise clear.  His ICD is on the left chest.    Meds: Vitamin D3, Norco, MOM, MVI, ASA 81 mg, bisacodyl, Coreg, Plavix, digoxin, enalapril, eplerenone, metformin, Ranexa, Crestor. I spoke with SNF nursing staff.  They had already received instructions from Dr. Corliss Skains to keep patient on ASA and Plavix.  EKG on 11/22/13 showed SR with first degree AVB, inferior T wave abnormality, consider ischemia. T wave abnormality is slightly more pronounced since 07/21/12.  Echo on 05/29/12 ([facility]PR; scanned under Media tab) showed mildly dilated LV, severe infero-posterior and apical segmental LV dysfunction, EF 25%, severely dilated LA, mild MR, mild AR, pacer wire visualized in RV.  CXR on 11/22/13 showed: FINDINGS: Cardiac shadow is enlarged but stable. Postsurgical changes are again seen. A defibrillator is again noted. Mild vascular congestion is seen (new since 07/21/12). No focal infiltrate is noted.  Preoperative labs noted.  Cr 1.21.  Glucose 171.  H/H 11.9/35.5.  PT/INR WNL. PTT elevated at 68--question if this is lab error.  He is on Plavix but has been on it for several years. I called PTT results to nursing staff at [facility], and was told that he was not receiving any Heparin there.  I faxed his labs, CXR, and EKG results to [facility]. I also faxed labs results to Dr. Fatima Sanger office. Confirmation received on both.  Since his office is now closed, I will follow-up next week. For now, I am ordering a repeat PTT to be done pre-procedure.  Revonda Standard  Sallee LangeZelenak, PA-C [facility]iology Phone [phone] 11/22/2013 6:40 PM  Addendum: 11/25/2013 4:54 PM I called and left a message at Dr. Fatima Sangereveshwar's office [phone]) regarding elevated PTT before 11:00 AM today. This afternoon I also called the alternate number listed for Dr. Corliss Skainseveshwar of [phone], but only received a automated answer.

## 2013-11-22 NOTE — Pre-Procedure Instructions (Addendum)
Carma LairBenigno Mcculley  11/22/2013   Your procedure is scheduled on:  Thursday, March 26.  [facility] / Entrance "A" at 6:00 AM.  Call this number if you have problems the morning of surgery: [phone]   Remember:   Do not eat food or drink liquids after midnight Wednesday.   Take these medicines the morning of  surgery with A SIP OF WATER: carvedilol (COREG),  digoxin (LANOXIN) ,ranolazine (RANEXA), eplerenone (INSPRA.    Take if needed: HYDROcodone-acetaminophen (NORCO/VICODIN).   Do not wear jewelry, make-up or nail polish.  Do not wear lotions, powders, or perfumes.    Men may shave face and neck.  Do not bring valuables to the hospital.  [facility] is not responsible  for any belongings or valuables.               Contacts, dentures or bridgework may not be worn into surgery.  Leave suitcase in the car. After surgery it may be brought to your room.  For patients admitted to the hospital, discharge time is determined by your treatment team.               Patients discharged the day of surgery will not be allowed to drive home.  Name and phone number of your driver: [facility].   Special Instructions: Review  [facility] - Preparing For Surgery.   Please read over the following fact sheets that you were given: Pain Booklet, Coughing and Deep Breathing and Surgical Site Infection Prevention

## 2013-11-25 ENCOUNTER — Ambulatory Visit (HOSPITAL_COMMUNITY): Payer: PRIVATE HEALTH INSURANCE

## 2013-11-26 MED ORDER — PROMETHAZINE HCL 25 MG/ML IJ SOLN
INTRAMUSCULAR | Status: AC
  Filled 2013-11-26: qty 1

## 2013-11-26 NOTE — Progress Notes (Signed)
Re faxed request to [facility] for stress test, echo, last office note and devise orders

## 2013-11-27 ENCOUNTER — Other Ambulatory Visit: Payer: Self-pay | Admitting: Radiology

## 2013-11-27 NOTE — Progress Notes (Signed)
Spoke with Leta JunglingMarcia with [employer] and informed her of pts procedure times and orders for ICD

## 2013-11-28 ENCOUNTER — Encounter (HOSPITAL_COMMUNITY): Payer: PRIVATE HEALTH INSURANCE | Admitting: Vascular Surgery

## 2013-11-28 ENCOUNTER — Other Ambulatory Visit: Payer: Self-pay

## 2013-11-28 ENCOUNTER — Observation Stay (HOSPITAL_COMMUNITY): Payer: PRIVATE HEALTH INSURANCE

## 2013-11-28 ENCOUNTER — Inpatient Hospital Stay (HOSPITAL_COMMUNITY)
Admission: RE | Admit: 2013-11-28 | Discharge: 2013-12-05 | DRG: 252 | Disposition: A | Discharge status: Skilled Nursing Facility | Payer: PRIVATE HEALTH INSURANCE | Source: Ambulatory Visit | Attending: Internal Medicine | Admitting: Internal Medicine

## 2013-11-28 ENCOUNTER — Encounter (HOSPITAL_COMMUNITY)
Admission: RE | Disposition: A | Discharge status: Skilled Nursing Facility | Payer: Self-pay | Source: Ambulatory Visit | Attending: Internal Medicine

## 2013-11-28 ENCOUNTER — Encounter (HOSPITAL_COMMUNITY): Payer: Self-pay | Admitting: Anesthesiology

## 2013-11-28 ENCOUNTER — Encounter (HOSPITAL_COMMUNITY): Payer: Self-pay

## 2013-11-28 ENCOUNTER — Ambulatory Visit (HOSPITAL_COMMUNITY): Payer: PRIVATE HEALTH INSURANCE | Admitting: Anesthesiology

## 2013-11-28 ENCOUNTER — Ambulatory Visit (HOSPITAL_COMMUNITY)
Admission: RE | Admit: 2013-11-28 | Discharge: 2013-11-28 | Disposition: A | Discharge status: Home / Self Care | Payer: PRIVATE HEALTH INSURANCE | Source: Ambulatory Visit | Attending: Interventional Radiology | Admitting: Interventional Radiology

## 2013-11-28 DIAGNOSIS — I5023 Acute on chronic systolic (congestive) heart failure: Secondary | ICD-10-CM | POA: Diagnosis present

## 2013-11-28 DIAGNOSIS — Z79899 Other long term (current) drug therapy: Secondary | ICD-10-CM

## 2013-11-28 DIAGNOSIS — Z86718 Personal history of other venous thrombosis and embolism: Secondary | ICD-10-CM | POA: Diagnosis not present

## 2013-11-28 DIAGNOSIS — Z6832 Body mass index (BMI) 32.0-32.9, adult: Secondary | ICD-10-CM

## 2013-11-28 DIAGNOSIS — I255 Ischemic cardiomyopathy: Secondary | ICD-10-CM

## 2013-11-28 DIAGNOSIS — I6529 Occlusion and stenosis of unspecified carotid artery: Secondary | ICD-10-CM | POA: Diagnosis present

## 2013-11-28 DIAGNOSIS — R0902 Hypoxemia: Secondary | ICD-10-CM

## 2013-11-28 DIAGNOSIS — I440 Atrioventricular block, first degree: Secondary | ICD-10-CM | POA: Diagnosis present

## 2013-11-28 DIAGNOSIS — I2510 Atherosclerotic heart disease of native coronary artery without angina pectoris: Secondary | ICD-10-CM

## 2013-11-28 DIAGNOSIS — D649 Anemia, unspecified: Secondary | ICD-10-CM | POA: Diagnosis present

## 2013-11-28 DIAGNOSIS — Z95810 Presence of automatic (implantable) cardiac defibrillator: Secondary | ICD-10-CM

## 2013-11-28 DIAGNOSIS — Z8614 Personal history of Methicillin resistant Staphylococcus aureus infection: Secondary | ICD-10-CM

## 2013-11-28 DIAGNOSIS — E1159 Type 2 diabetes mellitus with other circulatory complications: Secondary | ICD-10-CM

## 2013-11-28 DIAGNOSIS — J4489 Other specified chronic obstructive pulmonary disease: Secondary | ICD-10-CM | POA: Diagnosis present

## 2013-11-28 DIAGNOSIS — J449 Chronic obstructive pulmonary disease, unspecified: Secondary | ICD-10-CM | POA: Diagnosis present

## 2013-11-28 DIAGNOSIS — J9600 Acute respiratory failure, unspecified whether with hypoxia or hypercapnia: Secondary | ICD-10-CM | POA: Diagnosis present

## 2013-11-28 DIAGNOSIS — N183 Chronic kidney disease, stage 3 unspecified: Secondary | ICD-10-CM | POA: Diagnosis present

## 2013-11-28 DIAGNOSIS — S68118A Complete traumatic metacarpophalangeal amputation of other finger, initial encounter: Secondary | ICD-10-CM | POA: Diagnosis not present

## 2013-11-28 DIAGNOSIS — I6509 Occlusion and stenosis of unspecified vertebral artery: Secondary | ICD-10-CM | POA: Diagnosis present

## 2013-11-28 DIAGNOSIS — B965 Pseudomonas (aeruginosa) (mallei) (pseudomallei) as the cause of diseases classified elsewhere: Secondary | ICD-10-CM | POA: Diagnosis present

## 2013-11-28 DIAGNOSIS — N12 Tubulo-interstitial nephritis, not specified as acute or chronic: Secondary | ICD-10-CM | POA: Diagnosis present

## 2013-11-28 DIAGNOSIS — I509 Heart failure, unspecified: Secondary | ICD-10-CM | POA: Diagnosis present

## 2013-11-28 DIAGNOSIS — S78119A Complete traumatic amputation at level between unspecified hip and knee, initial encounter: Secondary | ICD-10-CM

## 2013-11-28 DIAGNOSIS — I639 Cerebral infarction, unspecified: Secondary | ICD-10-CM

## 2013-11-28 DIAGNOSIS — Z951 Presence of aortocoronary bypass graft: Secondary | ICD-10-CM | POA: Diagnosis not present

## 2013-11-28 DIAGNOSIS — I498 Other specified cardiac arrhythmias: Secondary | ICD-10-CM | POA: Diagnosis present

## 2013-11-28 DIAGNOSIS — I2589 Other forms of chronic ischemic heart disease: Secondary | ICD-10-CM | POA: Diagnosis present

## 2013-11-28 DIAGNOSIS — S88119A Complete traumatic amputation at level between knee and ankle, unspecified lower leg, initial encounter: Secondary | ICD-10-CM | POA: Diagnosis not present

## 2013-11-28 DIAGNOSIS — I4891 Unspecified atrial fibrillation: Secondary | ICD-10-CM | POA: Diagnosis present

## 2013-11-28 DIAGNOSIS — E785 Hyperlipidemia, unspecified: Secondary | ICD-10-CM

## 2013-11-28 DIAGNOSIS — N170 Acute kidney failure with tubular necrosis: Secondary | ICD-10-CM | POA: Diagnosis present

## 2013-11-28 DIAGNOSIS — I6350 Cerebral infarction due to unspecified occlusion or stenosis of unspecified cerebral artery: Secondary | ICD-10-CM

## 2013-11-28 DIAGNOSIS — Z7982 Long term (current) use of aspirin: Secondary | ICD-10-CM | POA: Diagnosis not present

## 2013-11-28 DIAGNOSIS — I129 Hypertensive chronic kidney disease with stage 1 through stage 4 chronic kidney disease, or unspecified chronic kidney disease: Secondary | ICD-10-CM | POA: Diagnosis present

## 2013-11-28 DIAGNOSIS — G9340 Encephalopathy, unspecified: Secondary | ICD-10-CM | POA: Diagnosis present

## 2013-11-28 DIAGNOSIS — I959 Hypotension, unspecified: Secondary | ICD-10-CM | POA: Diagnosis present

## 2013-11-28 DIAGNOSIS — E876 Hypokalemia: Secondary | ICD-10-CM | POA: Diagnosis present

## 2013-11-28 DIAGNOSIS — I4510 Unspecified right bundle-branch block: Secondary | ICD-10-CM | POA: Diagnosis present

## 2013-11-28 DIAGNOSIS — R001 Bradycardia, unspecified: Secondary | ICD-10-CM

## 2013-11-28 DIAGNOSIS — Z87891 Personal history of nicotine dependence: Secondary | ICD-10-CM | POA: Diagnosis not present

## 2013-11-28 DIAGNOSIS — I798 Other disorders of arteries, arterioles and capillaries in diseases classified elsewhere: Secondary | ICD-10-CM | POA: Diagnosis present

## 2013-11-28 DIAGNOSIS — I10 Essential (primary) hypertension: Secondary | ICD-10-CM

## 2013-11-28 DIAGNOSIS — I739 Peripheral vascular disease, unspecified: Secondary | ICD-10-CM

## 2013-11-28 DIAGNOSIS — Z8673 Personal history of transient ischemic attack (TIA), and cerebral infarction without residual deficits: Secondary | ICD-10-CM

## 2013-11-28 DIAGNOSIS — I63239 Cerebral infarction due to unspecified occlusion or stenosis of unspecified carotid arteries: Secondary | ICD-10-CM

## 2013-11-28 HISTORY — DX: Ventricular tachycardia: I47.2

## 2013-11-28 HISTORY — PX: RADIOLOGY WITH ANESTHESIA: SHX6223

## 2013-11-28 HISTORY — DX: Chronic systolic (congestive) heart failure: I50.22

## 2013-11-28 HISTORY — DX: Atherosclerotic heart disease of native coronary artery without angina pectoris: I25.10

## 2013-11-28 HISTORY — DX: Ventricular tachycardia, unspecified: I47.20

## 2013-11-28 LAB — BASIC METABOLIC PANEL
BUN: 23 mg/dL (ref 6–23)
CHLORIDE: 99 meq/L (ref 96–112)
CO2: 24 mEq/L (ref 19–32)
Calcium: 8.7 mg/dL (ref 8.4–10.5)
Creatinine, Ser: 1.4 mg/dL — ABNORMAL HIGH (ref 0.50–1.35)
GFR calc Af Amer: 55 mL/min — ABNORMAL LOW (ref >90)
GFR calc non Af Amer: 47 mL/min — ABNORMAL LOW (ref >90)
GLUCOSE: 193 mg/dL — AB (ref 70–99)
POTASSIUM: 4.3 meq/L (ref 3.7–5.3)
Sodium: 136 mEq/L — ABNORMAL LOW (ref 137–147)

## 2013-11-28 LAB — CBC WITH DIFFERENTIAL/PLATELET
Basophils Absolute: 0.1 10*3/uL (ref 0.0–0.1)
Basophils Relative: 0 % (ref 0–1)
EOS ABS: 0.2 10*3/uL (ref 0.0–0.7)
EOS PCT: 1 % (ref 0–5)
HEMATOCRIT: 33.6 % — AB (ref 39.0–52.0)
HEMOGLOBIN: 11.3 g/dL — AB (ref 13.0–17.0)
LYMPHS ABS: 1.9 10*3/uL (ref 0.7–4.0)
LYMPHS PCT: 16 % (ref 12–46)
MCH: 31.5 pg (ref 26.0–34.0)
MCHC: 33.6 g/dL (ref 30.0–36.0)
MCV: 93.6 fL (ref 78.0–100.0)
MONOS PCT: 9 % (ref 3–12)
Monocytes Absolute: 1.1 10*3/uL — ABNORMAL HIGH (ref 0.1–1.0)
Neutro Abs: 8.5 10*3/uL — ABNORMAL HIGH (ref 1.7–7.7)
Neutrophils Relative %: 73 % (ref 43–77)
Platelets: 226 10*3/uL (ref 150–400)
RBC: 3.59 MIL/uL — AB (ref 4.22–5.81)
RDW: 15.8 % — ABNORMAL HIGH (ref 11.5–15.5)
WBC: 11.8 10*3/uL — ABNORMAL HIGH (ref 4.0–10.5)

## 2013-11-28 LAB — GLUCOSE, CAPILLARY
GLUCOSE-CAPILLARY: 150 mg/dL — AB (ref 70–99)
Glucose-Capillary: 150 mg/dL — ABNORMAL HIGH (ref 70–99)
Glucose-Capillary: 122 mg/dL — ABNORMAL HIGH (ref 70–99)
Glucose-Capillary: 131 mg/dL — ABNORMAL HIGH (ref 70–99)
Glucose-Capillary: 182 mg/dL — ABNORMAL HIGH (ref 70–99)

## 2013-11-28 LAB — POCT I-STAT 3, ART BLOOD GAS (G3+)
BICARBONATE: 24.1 meq/L — AB (ref 20.0–24.0)
O2 SAT: 73 %
Patient temperature: 98
TCO2: 25 mmol/L (ref 0–100)
pCO2 arterial: 37.6 mmHg (ref 35.0–45.0)
pH, Arterial: 7.414 (ref 7.350–7.450)
pO2, Arterial: 37 mmHg — CL (ref 80.0–100.0)

## 2013-11-28 LAB — MAGNESIUM: Magnesium: 1.7 mg/dL (ref 1.5–2.5)

## 2013-11-28 LAB — MRSA PCR SCREENING: MRSA BY PCR: POSITIVE — AB

## 2013-11-28 LAB — DIGOXIN LEVEL: Digoxin Level: 0.5 ng/mL — ABNORMAL LOW (ref 0.8–2.0)

## 2013-11-28 LAB — APTT: APTT: 56 s — AB (ref 24–37)

## 2013-11-28 LAB — POCT ACTIVATED CLOTTING TIME
ACTIVATED CLOTTING TIME: 177 s
ACTIVATED CLOTTING TIME: 166 s

## 2013-11-28 SURGERY — RADIOLOGY WITH ANESTHESIA
Anesthesia: General

## 2013-11-28 MED ORDER — CEFAZOLIN SODIUM-DEXTROSE 2-3 GM-% IV SOLR
INTRAVENOUS | Status: DC | PRN
  Administered 2013-11-28: 2 g via INTRAVENOUS

## 2013-11-28 MED ORDER — NICARDIPINE HCL IN NACL 20-0.86 MG/200ML-% IV SOLN
3.0000 mg/h | INTRAVENOUS | Status: DC
  Filled 2013-11-28: qty 200

## 2013-11-28 MED ORDER — RANOLAZINE ER 500 MG PO TB12
500.0000 mg | ORAL_TABLET | Freq: Two times a day (BID) | ORAL | Status: DC
  Administered 2013-11-28 – 2013-12-05 (×14): 500 mg via ORAL
  Filled 2013-11-28 (×15): qty 1

## 2013-11-28 MED ORDER — ATORVASTATIN CALCIUM 40 MG PO TABS
40.0000 mg | ORAL_TABLET | Freq: Every day | ORAL | Status: DC
  Administered 2013-11-28 – 2013-12-04 (×7): 40 mg via ORAL
  Filled 2013-11-28 (×8): qty 1

## 2013-11-28 MED ORDER — HYDROMORPHONE HCL PF 1 MG/ML IJ SOLN: 0.2500 mg | INTRAMUSCULAR | Status: DC | PRN

## 2013-11-28 MED ORDER — INSULIN ASPART 100 UNIT/ML ~~LOC~~ SOLN
0.0000 [IU] | Freq: Three times a day (TID) | SUBCUTANEOUS | Status: DC
  Administered 2013-11-28 – 2013-11-29 (×2): 1 [IU] via SUBCUTANEOUS

## 2013-11-28 MED ORDER — SODIUM CHLORIDE 0.9 % IV SOLN
INTRAVENOUS | Status: DC
  Administered 2013-11-28: 10 mL/h via INTRAVENOUS

## 2013-11-28 MED ORDER — HEPARIN SODIUM (PORCINE) 1000 UNIT/ML IJ SOLN
INTRAMUSCULAR | Status: DC | PRN
  Administered 2013-11-28: 1 mL via INTRAVENOUS
  Administered 2013-11-28: 2 mL via INTRAVENOUS
  Administered 2013-11-28: .5 mL via INTRAVENOUS

## 2013-11-28 MED ORDER — ALBUTEROL SULFATE (2.5 MG/3ML) 0.083% IN NEBU: 5.0000 mg | INHALATION_SOLUTION | Freq: Once | RESPIRATORY_TRACT | Status: DC

## 2013-11-28 MED ORDER — GLYCOPYRROLATE 0.2 MG/ML IJ SOLN
INTRAMUSCULAR | Status: DC | PRN
Start: 2013-11-28 — End: 2013-11-28
  Administered 2013-11-28 (×2): 0.2 mg via INTRAVENOUS

## 2013-11-28 MED ORDER — HEPARIN (PORCINE) IN NACL 100-0.45 UNIT/ML-% IJ SOLN
500.0000 [IU]/h | INTRAMUSCULAR | Status: DC
  Filled 2013-11-28: qty 250

## 2013-11-28 MED ORDER — IOHEXOL 300 MG/ML  SOLN
150.0000 mL | Freq: Once | INTRAMUSCULAR | Status: AC | PRN
  Administered 2013-11-28: 120 mL via INTRA_ARTERIAL

## 2013-11-28 MED ORDER — CEFAZOLIN SODIUM-DEXTROSE 2-3 GM-% IV SOLR
INTRAVENOUS | Status: AC
  Filled 2013-11-28: qty 50

## 2013-11-28 MED ORDER — NICARDIPINE HCL IN NACL 20-0.86 MG/200ML-% IV SOLN
5.0000 mg/h | INTRAVENOUS | Status: DC
  Administered 2013-11-28: 5 mg/h via INTRAVENOUS
  Filled 2013-11-28: qty 200

## 2013-11-28 MED ORDER — PROMETHAZINE HCL 25 MG/ML IJ SOLN: 6.2500 mg | INTRAMUSCULAR | Status: DC | PRN

## 2013-11-28 MED ORDER — OXYCODONE HCL 5 MG PO TABS: 5.0000 mg | ORAL_TABLET | Freq: Once | ORAL | Status: DC | PRN

## 2013-11-28 MED ORDER — ONDANSETRON HCL 4 MG/2ML IJ SOLN
4.0000 mg | Freq: Four times a day (QID) | INTRAMUSCULAR | Status: DC | PRN
  Filled 2013-11-28: qty 2

## 2013-11-28 MED ORDER — CLOPIDOGREL BISULFATE 75 MG PO TABS
75.0000 mg | ORAL_TABLET | Freq: Every day | ORAL | Status: DC
  Administered 2013-11-29 – 2013-12-05 (×7): 75 mg via ORAL
  Filled 2013-11-28 (×7): qty 1

## 2013-11-28 MED ORDER — OXYCODONE HCL 5 MG/5ML PO SOLN: 5.0000 mg | Freq: Once | ORAL | Status: DC | PRN

## 2013-11-28 MED ORDER — HEPARIN (PORCINE) IN NACL 100-0.45 UNIT/ML-% IJ SOLN
750.0000 [IU]/h | INTRAMUSCULAR | Status: AC
  Administered 2013-11-29: 750 [IU]/h via INTRAVENOUS
  Filled 2013-11-28: qty 250

## 2013-11-28 MED ORDER — ENALAPRIL MALEATE 2.5 MG PO TABS
2.5000 mg | ORAL_TABLET | Freq: Every day | ORAL | Status: DC
  Administered 2013-11-28: 2.5 mg via ORAL
  Filled 2013-11-28: qty 1

## 2013-11-28 MED ORDER — LACTATED RINGERS IV SOLN
INTRAVENOUS | Status: DC | PRN
  Administered 2013-11-28: 08:00:00 via INTRAVENOUS

## 2013-11-28 MED ORDER — BISACODYL 10 MG RE SUPP: 10.0000 mg | Freq: Every day | RECTAL | Status: DC | PRN

## 2013-11-28 MED ORDER — HEPARIN (PORCINE) IN NACL 100-0.45 UNIT/ML-% IJ SOLN
650.0000 [IU]/h | INTRAMUSCULAR | Status: DC
  Administered 2013-11-28: 650 [IU]/h via INTRAVENOUS
  Filled 2013-11-28: qty 250

## 2013-11-28 MED ORDER — IPRATROPIUM-ALBUTEROL 0.5-2.5 (3) MG/3ML IN SOLN
3.0000 mL | Freq: Once | RESPIRATORY_TRACT | Status: AC
  Administered 2013-11-28: 3 mL via RESPIRATORY_TRACT
  Filled 2013-11-28: qty 3

## 2013-11-28 MED ORDER — ACETAMINOPHEN 500 MG PO TABS: 1000.0000 mg | ORAL_TABLET | Freq: Four times a day (QID) | ORAL | Status: DC | PRN

## 2013-11-28 MED ORDER — FUROSEMIDE 10 MG/ML IJ SOLN
20.0000 mg | Freq: Two times a day (BID) | INTRAMUSCULAR | Status: DC
Start: 2013-11-28 — End: 2013-11-29
  Administered 2013-11-28: 20 mg via INTRAVENOUS
  Filled 2013-11-28 (×2): qty 2

## 2013-11-28 MED ORDER — IPRATROPIUM BROMIDE 0.02 % IN SOLN: 0.5000 mg | Freq: Once | RESPIRATORY_TRACT | Status: DC

## 2013-11-28 MED ORDER — ACETAMINOPHEN 650 MG RE SUPP: 650.0000 mg | Freq: Four times a day (QID) | RECTAL | Status: DC | PRN

## 2013-11-28 MED ORDER — FUROSEMIDE 10 MG/ML IJ SOLN
INTRAMUSCULAR | Status: AC
Start: 2013-11-28 — End: 2013-11-28
  Filled 2013-11-28: qty 4

## 2013-11-28 MED ORDER — SPIRONOLACTONE 25 MG PO TABS
25.0000 mg | ORAL_TABLET | Freq: Every day | ORAL | Status: DC
  Administered 2013-11-29: 25 mg via ORAL
  Filled 2013-11-28: qty 1

## 2013-11-28 MED ORDER — NIMODIPINE 30 MG PO CAPS
ORAL_CAPSULE | ORAL | Status: DC
Start: 2013-11-28 — End: 2013-11-28
  Filled 2013-11-28: qty 2

## 2013-11-28 MED ORDER — ASPIRIN 325 MG PO TABS
325.0000 mg | ORAL_TABLET | Freq: Every day | ORAL | Status: DC
  Administered 2013-11-29 – 2013-12-05 (×7): 325 mg via ORAL
  Filled 2013-11-28 (×8): qty 1

## 2013-11-28 MED ORDER — DIGOXIN 0.0625 MG HALF TABLET
0.0625 mg | ORAL_TABLET | Freq: Every day | ORAL | Status: DC
  Filled 2013-11-28: qty 1

## 2013-11-28 MED ORDER — CARVEDILOL 25 MG PO TABS
25.0000 mg | ORAL_TABLET | Freq: Two times a day (BID) | ORAL | Status: DC
  Administered 2013-11-28: 25 mg via ORAL
  Filled 2013-11-28 (×4): qty 1

## 2013-11-28 NOTE — H&P (Signed)
Francisco Brock is an 78 y.o. male.   Chief Complaint: Pt with long hx of CVA; carotid stenosis;occlusive disease. Has been followed by Cardio with carotid US Recent US reveals severe stenosis L ICA and stenosis at L cavernous/supraclinoid jxn Was consulted with Dr Corliss Skains Now scheduled for cerebral arteriogram with probable angioplasty/stent of L Internal Carotid Artery Has been cleared by cardiology Has been seen by ortho regarding L index finger--no treatment  HPI: CAD/ defibrillator and pacemaker; CVA; B leg amputations; CHF; HTN; DM; PVD; COPD  Past Medical History  Diagnosis Date  . Coronary artery stenosis   . Subdural hematoma   . 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 Cardiac defibrillator in place   . CHF (congestive heart failure)   . Diabetes mellitus without complication   . Hypertension   . Cardiomyopathy   . Hyperlipemia   . Peripheral vascular disease   . COPD (chronic obstructive pulmonary disease)   . Shortness of breath   . Dysrhythmia     hx VT  . Stroke 1998    no residual  . Automatic implantable cardioverter-defibrillator in situ   . Pacemaker   . Pneumonia 2012  . Seizures     last one maybe 2012  . History of blood transfusion   . DVT (deep venous thrombosis)   . 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 Leg amputation above knee      right  . Leg amputation below knee      left  . Coronary artery bypass graft  2004  . Cardiac defibrillator placement  2007  . Balloon angioplasty, artery      rt lower leg  . Carotid endarterectomy      bilateral  . Amputation  06/28/2012    Procedure: AMPUTATION DIGIT;  Surgeon: Tami Ribas, MD;  Location: [facility] OR;  Service: Orthopedics;  Laterality: Left;  left  distal index finger  . Eye surgery Bilateral     No family history on file. Social History:  reports that he has quit smoking. His smoking use included Cigars.  He does not have any smokeless tobacco history on file. He reports that he does not drink alcohol or use illicit drugs.  Allergies: No Known Allergies   (Not in a hospital admission)  Results for orders placed during the hospital encounter of 11/28/13 (from the past 48 hour(s))  GLUCOSE, CAPILLARY     Status: Abnormal   Collection Time    11/28/13  6:27 AM      Result Value Ref Range   Glucose-Capillary 150 (*) 70 - 99 mg/dL   Comment 1 Documented in Chart     Comment 2 Notify RN     No results found.  Review of Systems  Constitutional: Negative for fever.  HENT: Negative for tinnitus.   Eyes: Negative for blurred vision and double vision.  Respiratory: Positive for cough. Negative for sputum production and shortness of breath.   Cardiovascular: Negative for chest pain.  Gastrointestinal: Negative for nausea, vomiting and abdominal pain.  Musculoskeletal: Negative for back pain.  Neurological: Positive for weakness. Negative for dizziness, sensory change, focal weakness, seizures and headaches.       Speech is slow  Psychiatric/Behavioral: Negative for substance abuse.    There were no vitals taken for this visit. Physical Exam  Constitutional: He is oriented  to person, place, and time. He appears well-nourished.  NAD;  Speaks Spanish; little [place] Dtr and wife in room--both speak English well  Cardiovascular: Normal rate and regular rhythm.   Murmur heard. Pt has been seen by cardiologist 10/2013- cleared for procedure  Respiratory: Effort normal and breath sounds normal. He has no wheezes.  GI: Soft. Bowel sounds are normal. There is no tenderness.  Musculoskeletal: Normal range of motion.  Left BKA; Rt AKA Left #2 finger sl redness- pt has been seen by MD per wife and has been deemed noninfectious  Neurological: He is alert and oriented to person, place, and time.  Skin: Skin is warm and dry.  Psychiatric: He has a normal mood and affect. His behavior is normal.  Judgment and thought content normal.     Assessment/Plan Known carotid disease/previous CVA Most recent US revealed severe L ICA stenosis with L cavernous and supraclinoid jxn stenosis Pt is here today for cerebral arteriogram with possible pta/stent of L ICA and tandem  L cavernous/supraclinoid jxn treatment Pt and family aware of procedure benefits and risks and agreeable to proceed consent signed and in chart Pt aware he will be admitted to Neuro ICU if intervention is performed   Nafis Farnan A 11/28/2013, 7:37 AM

## 2013-11-28 NOTE — Transfer of Care (Signed)
Immediate Anesthesia Transfer of Care Note  Patient: Francisco LairBenigno Brock  Procedure(s) Performed: Procedure(s): ANGIO WITH CAROTID STENT  (N/A)  Patient Location: PACU  Anesthesia Type:MAC  Level of Consciousness: awake, alert  and oriented  Airway & Oxygen Therapy: Patient Spontanous Breathing and Patient connected to nasal cannula oxygen  Post-op Assessment: Report given to PACU RN and Post -op Vital signs reviewed and stable  Post vital signs: Reviewed and stable  Complications: No apparent anesthesia complications

## 2013-11-28 NOTE — H&P (Signed)
PCP:   Terald Sleeper, MD   Chief Complaint:  Medical management  HPI:  78 yr old male   has a past medical history of Coronary artery stenosis; Subdural hematoma; Amputation of leg; Cardiac defibrillator in place; CHF (congestive heart failure); Diabetes mellitus without complication; Hypertension; Cardiomyopathy; Hyperlipemia; Peripheral vascular disease; COPD (chronic obstructive pulmonary disease); Shortness of breath; Dysrhythmia; Stroke (1998); Automatic implantable cardioverter-defibrillator in situ; Pacemaker; Pneumonia (2012); Seizures; History of blood transfusion; DVT (deep venous thrombosis); and Amputation finger. Today he came to the radiology for bilateral artery arteriogram and right vertebral artery angiogram with findings of right vertebral artery origin stenosis and severe left ICA proximal stenosis. He had left ICA stent assisted angioplasty.  [facility] hospital[facility] consulted for medical management. He denies chest pain, no nausea, vomiting or diarrhea. He did have mild difficulty breathing, CXR was obtained which showed pulmonary edema.  Allergies:  No Known Allergies    Past Medical History  Diagnosis Date  . Coronary artery stenosis   . Subdural hematoma   . 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 Cardiac defibrillator in place   . CHF (congestive heart failure)   . Diabetes mellitus without complication   . Hypertension   . Cardiomyopathy   . Hyperlipemia   . Peripheral vascular disease   . COPD (chronic obstructive pulmonary disease)   . Shortness of breath   . Dysrhythmia     hx VT  . Stroke 1998    no residual  . Automatic implantable cardioverter-defibrillator in situ   . Pacemaker   . Pneumonia 2012  . Seizures     last one maybe 2012  . History of blood transfusion   . DVT (deep venous thrombosis)   . 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 Leg amputation  above knee      right  . Leg amputation below knee      left  . Coronary artery bypass graft  2004  . Cardiac defibrillator placement  2007  . Balloon angioplasty, artery      rt lower leg  . Carotid endarterectomy      bilateral  . Amputation  06/28/2012    Procedure: AMPUTATION DIGIT;  Surgeon: Tami Ribas, MD;  Location: [facility] OR;  Service: Orthopedics;  Laterality: Left;  left  distal index finger  . Eye surgery Bilateral     Prior to Admission medications   Medication Sig Start Date End Date Taking? Authorizing Provider  aspirin 81 MG chewable tablet Chew 81 mg by mouth daily.   Yes Historical Provider, MD  bisacodyl (DULCOLAX) 10 MG suppository Place 10 mg rectally daily as needed for mild constipation or moderate constipation.   Yes Historical Provider, MD  carvedilol (COREG) 25 MG tablet Take 25 mg by mouth 2 (two) times daily with a meal.   Yes Historical Provider, MD  Cholecalciferol (VITAMIN D3) 50000 UNITS CAPS Take 50,000 Units by mouth every 30 (thirty) days. On the 15th of every month   Yes Historical Provider, MD  clopidogrel (PLAVIX) 75 MG tablet Take 75 mg by mouth daily.   Yes Historical Provider, MD  digoxin (LANOXIN) 0.125 MG tablet Take 0.0625 mg by mouth daily.    Yes Historical Provider, MD  enalapril (VASOTEC) 5 MG tablet Take 0.5 tablets (2.5 mg total) by mouth daily. 03/20/13  Yes [name] Sicilian  Green, NP  eplerenone (INSPRA) 25 MG tablet Take 25 mg by mouth daily.   Yes Historical Provider, MD  HYDROcodone-acetaminophen (NORCO/VICODIN) 5-325 MG per tablet Take 1-2 tablets by mouth every 6 (six) hours as needed for moderate pain.  06/28/12  Yes Tami RibasKevin R Kuzma, MD  Magnesium Hydroxide (MILK OF MAGNESIA PO) Take 30 mLs by mouth daily as needed (constipation).   Yes Historical Provider, MD  metFORMIN (GLUCOPHAGE) 850 MG tablet Take 850 mg by mouth daily with breakfast.   Yes Historical Provider, MD  Multiple Vitamins-Minerals (MULTIVITAMIN WITH MINERALS) tablet Take 1  tablet by mouth daily.   Yes Historical Provider, MD  protective barrier (RESTORE) CREA Apply 1 application topically 3 (three) times daily as needed (to perlarea once a shift and after each incontinent episode).   Yes Historical Provider, MD  ranolazine (RANEXA) 500 MG 12 hr tablet Take 500 mg by mouth 2 (two) times daily.   Yes Historical Provider, MD  rosuvastatin (CRESTOR) 40 MG tablet Take 20 mg by mouth at bedtime.    Yes Historical Provider, MD    Social History:  reports that he has quit smoking. His smoking use included Cigars. He does not have any smokeless tobacco history on file. He reports that he does not drink alcohol or use illicit drugs.  History reviewed. No pertinent family history.   All the positives are listed in BOLD  Review of Systems:  HEENT: Headache, blurred vision, runny nose, sore throat Neck: Hypothyroidism, hyperthyroidism,,lymphadenopathy Chest : Shortness of breath, history of COPD, Asthma Heart : Chest pain, history of coronary arterey disease GI:  Nausea, vomiting, diarrhea, constipation, GERD GU: Dysuria, urgency, frequency of urination, hematuria Neuro: Stroke, seizures, syncope Psych: Depression, anxiety, hallucinations   Physical Exam: Blood pressure 116/37, pulse 50, temperature 97.4 F (36.3 C), temperature source Oral, resp. rate 24, height 4\' 9"  (1.448 m), weight 68.7 kg (151 lb 7.3 oz), SpO2 99.00%. Constitutional:   Patient is a well-developed and well-nourished male* in no acute distress and cooperative with exam. Head: Normocephalic and atraumatic Mouth: Mucus membranes moist Eyes: PERRL, EOMI, conjunctivae normal Neck: Supple, No Thyromegaly Cardiovascular: RRR, S1 normal, S2 normal Pulmonary/Chest: Bibasilar crackles Abdominal: Soft. Non-tender, non-distended, bowel sounds are normal, no masses, organomegaly, or guarding present.  Neurological: A&O x3, Strenght is normal and symmetric bilaterally, cranial nerve II-XII are grossly  intact, no focal motor deficit, sensory intact to light touch bilaterally.  Extremities : No Cyanosis, Clubbing or Edema   Labs on Admission:  Results for orders placed during the hospital encounter of 11/28/13 (from the past 48 hour(s))  GLUCOSE, CAPILLARY     Status: Abnormal   Collection Time    11/28/13  6:27 AM      Result Value Ref Range   Glucose-Capillary 150 (*) 70 - 99 mg/dL   Comment 1 Documented in Chart     Comment 2 Notify RN    APTT     Status: Abnormal   Collection Time    11/28/13  6:50 AM      Result Value Ref Range   aPTT 56 (*) 24 - 37 seconds   Comment:            IF BASELINE aPTT IS ELEVATED,     SUGGEST PATIENT RISK ASSESSMENT     BE USED TO DETERMINE APPROPRIATE     ANTICOAGULANT THERAPY.  GLUCOSE, CAPILLARY     Status: Abnormal   Collection Time    11/28/13  8:34 AM  Result Value Ref Range   Glucose-Capillary 150 (*) 70 - 99 mg/dL  MRSA PCR SCREENING     Status: Abnormal   Collection Time    11/28/13  2:22 PM      Result Value Ref Range   MRSA by PCR POSITIVE (*) NEGATIVE   Comment:            The GeneXpert MRSA Assay (FDA     approved for NASAL specimens     only), is one component of a     comprehensive MRSA colonization     surveillance program. It is not     intended to diagnose MRSA     infection nor to guide or     monitor treatment for     MRSA infections.     RESULT CALLED TO, READ BACK BY AND VERIFIED WITH:     A. [facility] RN 16:35 11/28/13 ([email or web address])  GLUCOSE, CAPILLARY     Status: Abnormal   Collection Time    11/28/13  5:04 PM      Result Value Ref Range   Glucose-Capillary 131 (*) 70 - 99 mg/dL   Comment 1 Notify RN     Comment 2 Documented in Chart      Radiological Exams on Admission: Dg Chest Port 1v Same Day  11/28/2013   CLINICAL DATA:  Shortness of breath.  EXAM: PORTABLE CHEST - 1 VIEW SAME DAY  COMPARISON:  Chest x-ray 11/22/2013  FINDINGS: Prior CABG. Cardiac pacer noted with lead tips in right atrium and right  ventricle. Severe cardiomegaly. Interim appearance of severe pulmonary venous congestion and interstitial prominence with left-sided pleural effusion. These findings are consistent with congestive heart failure and pulmonary edema. No pneumothorax. No acute osseus abnormality.  IMPRESSION: Severe congestive heart failure with pulmonary edema and left-sided pleural effusion .   Electronically Signed   By: Maisie Fus  Register   On: 11/28/2013 16:23    Assessment/Plan Active Problems:   Dyslipidemia   Type II or unspecified type diabetes mellitus with peripheral circulatory disorders, uncontrolled(250.72)   CHF (congestive heart failure)   CAD (coronary artery disease)   PVD (peripheral vascular disease)   Carotid stenosis, symptomatic, with infarction  Pulmonary edema CXR shows pulmonary edema, will change IV fluids to [facility]. Start Lasix 20 mg IV q 1 2hr. Echocardiogram in 2011  showed 30-35%, with Grade 1 diastolic dysfunction.  Diabetes Mellitus Will start the sliding scale inulin. Will hold   CAD Continue with Coreg, Plavix, aspirin   Hypertension Continue with vasotec, coreg  S/p Left ICA stent assisted angioplasty Followed by Dr Perlie Mayo. Heparin, nicardipine as per IR   Time Spent on Admission: 55 min  Temeca Somma S [facility] Pager: [phone] 11/28/2013, 7:49 PM  If 7PM-7AM, please contact night-coverage  [email or web address]  Password [email or web address]

## 2013-11-28 NOTE — Anesthesia Preprocedure Evaluation (Addendum)
Anesthesia Evaluation  Patient identified by MRN, date of birth, ID band Patient awake    Reviewed: Allergy & Precautions, H&P , NPO status , Patient's Chart, lab work & pertinent test results  Airway Mallampati: III TM Distance: >3 FB Neck ROM: Full    Dental  (+) Teeth Intact, Dental Advisory Given   Pulmonary shortness of breath, pneumonia -, COPDformer smoker,          Cardiovascular hypertension, Pt. on home beta blockers + CAD, + Peripheral Vascular Disease, +CHF and DVT + dysrhythmias + pacemaker + Cardiac Defibrillator Rhythm:Regular Rate:Normal     Neuro/Psych Seizures -,  CVA negative psych ROS   GI/Hepatic negative GI ROS, Neg liver ROS,   Endo/Other  diabetes, Type 2  Renal/GU negative Renal ROS     Musculoskeletal   Abdominal   Peds  Hematology   Anesthesia Other Findings Pt. Has non-productive, mucous-like cough; anesthesiologist aware.  Reproductive/Obstetrics                         Anesthesia Physical Anesthesia Plan  ASA: III  Anesthesia Plan: General   Post-op Pain Management:    Induction: Intravenous  Airway Management Planned: Oral ETT  Additional Equipment: Arterial line  Intra-op Plan:   Post-operative Plan: Possible Post-op intubation/ventilation  Informed Consent: I have reviewed the patients History and Physical, chart, labs and discussed the procedure including the risks, benefits and alternatives for the proposed anesthesia with the patient or authorized representative who has indicated his/her understanding and acceptance.   Dental advisory given  Plan Discussed with: CRNA, Anesthesiologist and Surgeon  Anesthesia Plan Comments:        Anesthesia Quick Evaluation

## 2013-11-28 NOTE — Progress Notes (Signed)
I was called by the nurse because patient became bradycardic. His heart was in 30s as per the nurse. On exam and bedside patient is not in acute distress but hypoxic. Blood pressure at this time is on 110/60 pulse is around 60 per minute. Respirations around 24 per minute. Patient is following commands and moving all extremities. Denies any headache neck pain chest pain or shortness of breath. EKG shows sinus bradycardia with beats aroun 56 beats per minute first-degree AV block and RBBB. I have ordered stat metabolic panel, digoxin level, CBC, ABG and chest x-ray. I have discussed with on-call cardiologist and pulmonary critical care.  Midge MiniumArshad Kakrakandy

## 2013-11-28 NOTE — Anesthesia Postprocedure Evaluation (Signed)
Anesthesia Post Note  Patient: Francisco Brock  Procedure(s) Performed: Procedure(s) (LRB): ANGIO WITH CAROTID STENT  (N/A)  Anesthesia type: MAC  Patient location: PACU  Post pain: Pain level controlled  Post assessment: Patient's Cardiovascular Status Stable  Last Vitals:  Filed Vitals:   11/28/13 1330  BP:   Pulse: 69  Temp:   Resp: 24    Post vital signs: Reviewed and stable  Level of consciousness: sedated  Complications: No apparent anesthesia complications

## 2013-11-28 NOTE — Preoperative (Signed)
Beta Blockers   Reason not to administer Beta Blockers:Not Applicable 

## 2013-11-28 NOTE — Procedures (Signed)
S/P bilateral common carotid artery arteriograms,and  RT vertebral artery  Angiogram. Findings. Severe Rt VA origin stenosis. Severe Lt ICA prox  stenosis with severe LT ICA supraclinoid stenosis . S/P Lt ICA stent assisted  Angioplasty with distal protection.. No acute complications. Neurologically stable. Admit with internal med consult for diabetes,CHF and BP management. Called.

## 2013-11-28 NOTE — Progress Notes (Signed)
Contacted IR charge nurse regarding procedural use of magnet over patient's pacemaker. Verified no magnet was utilized during case.

## 2013-11-28 NOTE — Progress Notes (Signed)
Subjective: Francisco Brock is an 78 y.o. male with history of CVA and recent US revealed LICA stenosis. Patient was seen in consult and scheduled today for angiogram and possible intervention. The patient underwent bilateral common carotid artery arteriograms and RT vertebral artery angiogram with findings including severe Rt VA origin stenosis and severe Lt ICA prox stenosis with severe LT ICA supraclinoid stenosis. The patient had a Lt ICA stent assisted Angioplasty with distal protection with no immediate complications. He has been extubated and does have a raspy grunt with his breathing. He denies any shortness of breath, his oxygen saturation is 92%. He denies any headache, extremity weakness or vision changes. His family helps translate and state that he usually coughs a lot and feels he may be making this sound because he is not allowed to cough.   Past Medical History:  Past Medical History  Diagnosis Date  . Coronary artery stenosis   . Subdural hematoma   . 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 Cardiac defibrillator in place   . CHF (congestive heart failure)   . Diabetes mellitus without complication   . Hypertension   . Cardiomyopathy   . Hyperlipemia   . Peripheral vascular disease   . COPD (chronic obstructive pulmonary disease)   . Shortness of breath   . Dysrhythmia     hx VT  . Stroke 1998    no residual  . Automatic implantable cardioverter-defibrillator in situ   . Pacemaker   . Pneumonia 2012  . Seizures     last one maybe 2012  . History of blood transfusion   . DVT (deep venous thrombosis)   . 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 Leg amputation above knee      right  . Leg amputation below knee      left  . Coronary artery bypass graft  2004  . Cardiac defibrillator placement  2007  . Balloon angioplasty, artery      rt lower leg  . Carotid  endarterectomy      bilateral  . Amputation  06/28/2012    Procedure: AMPUTATION DIGIT;  Surgeon: Tami Ribas, MD;  Location: [facility] OR;  Service: Orthopedics;  Laterality: Left;  left  distal index finger  . Eye surgery Bilateral     Family History: No family history on file.  Social History:  reports that he has quit smoking. His smoking use included Cigars. He does not have any smokeless tobacco history on file. He reports that he does not drink alcohol or use illicit drugs.  Allergies: No Known Allergies  Medications:   Medication List    ASK your doctor about these medications       aspirin 81 MG chewable tablet  Chew 81 mg by mouth daily.     bisacodyl 10 MG suppository  Commonly known as:  DULCOLAX  Place 10 mg rectally daily as needed for mild constipation or moderate constipation.     carvedilol 25 MG tablet  Commonly known as:  COREG  Take 25 mg by mouth 2 (two) times daily with a meal.     clopidogrel 75 MG tablet  Commonly known as:  PLAVIX  Take 75 mg by mouth daily.     digoxin 0.125 MG tablet  Commonly known as:  LANOXIN  Take 0.0625 mg  by mouth daily.     enalapril 5 MG tablet  Commonly known as:  VASOTEC  Take 0.5 tablets (2.5 mg total) by mouth daily.     eplerenone 25 MG tablet  Commonly known as:  INSPRA  Take 25 mg by mouth daily.     HYDROcodone-acetaminophen 5-325 MG per tablet  Commonly known as:  NORCO/VICODIN  Take 1-2 tablets by mouth every 6 (six) hours as needed for moderate pain.     metFORMIN 850 MG tablet  Commonly known as:  GLUCOPHAGE  Take 850 mg by mouth daily with breakfast.     MILK OF MAGNESIA PO  Take 30 mLs by mouth daily as needed (constipation).     multivitamin with minerals tablet  Take 1 tablet by mouth daily.     protective barrier Crea  Apply 1 application topically 3 (three) times daily as needed (to perlarea once a shift and after each incontinent episode).     ranolazine 500 MG 12 hr tablet  Commonly  known as:  RANEXA  Take 500 mg by mouth 2 (two) times daily.     rosuvastatin 40 MG tablet  Commonly known as:  CRESTOR  Take 20 mg by mouth at bedtime.     Vitamin D3 50000 UNITS Caps  Take 50,000 Units by mouth every 30 (thirty) days. On the 15th of every month       Please HPI for pertinent positives, otherwise complete 10 system ROS negative.  Objective: BP 150/58  Pulse 64  Temp(Src) 97 F (36.1 C) (Oral)  Resp 25  Wt 152 lb (68.947 kg)  SpO2 92% Body mass index is 32.88 kg/(m^2).  General Appearance:  Alert, cooperative, no distress, extubated  Head:  Normocephalic, without obvious abnormality, atraumatic  Neck: Neck brace intact.   Lungs:   Clear to auscultation bilaterally, no w/r/r, respirations unlabored without use of accessory muscles.  Chest Wall:  No tenderness or deformity  Heart:  Regular rate and rhythm, S1, S2 normal, no murmur, rub or gallop.  Abdomen:   Soft, non-tender, non distended, (+) BS  Extremities: Left BKA, Right AKA  Neurologic: Normal affect, no gross deficits, no ataxia, strength equal upper extremities, smile symmetrical, tongue midline, PERRLA.   Results for orders placed during the hospital encounter of 11/28/13 (from the past 48 hour(s))  GLUCOSE, CAPILLARY     Status: Abnormal   Collection Time    11/28/13  6:27 AM      Result Value Ref Range   Glucose-Capillary 150 (*) 70 - 99 mg/dL   Comment 1 Documented in Chart     Comment 2 Notify RN    APTT     Status: Abnormal   Collection Time    11/28/13  6:50 AM      Result Value Ref Range   aPTT 56 (*) 24 - 37 seconds   Comment:            IF BASELINE aPTT IS ELEVATED,     SUGGEST PATIENT RISK ASSESSMENT     BE USED TO DETERMINE APPROPRIATE     ANTICOAGULANT THERAPY.  GLUCOSE, CAPILLARY     Status: Abnormal   Collection Time    11/28/13  8:34 AM      Result Value Ref Range   Glucose-Capillary 150 (*) 70 - 99 mg/dL   No results found.  Assessment/Plan Severe left ICA stenosis  with left cavernous and supraclinoid jxn stenosis  S/p cerebral arteriogram and Lt ICA stent assisted angioplasty  with distal protection. On IV heparin until 3/27, with neck brace intact, monitor BP closely with parameters. Appreciate hospitalists help with managing care. Possible discharge in am if stable. Patient was seen by Dr. Corliss Skainseveshwar today.    Pattricia BossMORGAN, Anayiah Howden D PA-C 11/28/2013, 3:18 PM

## 2013-11-28 NOTE — Progress Notes (Signed)
ANTICOAGULATION CONSULT NOTE - Initial Consult  Pharmacy Consult for Heparin Indication: s/p carotid stent  No Known Allergies  Patient Measurements: Weight: 152 lb (68.947 kg)  Vital Signs: Temp: 97 F (36.1 C) (03/26 1200) Temp src: Oral (03/26 0624) BP: 109/49 mmHg (03/26 1250) Pulse Rate: 71 (03/26 1303)  Labs:  Recent Labs  11/28/13 0650  APTT 56*    The CrCl is unknown because both a height and weight (above a minimum accepted value) are required for this calculation.   Medical History: Past Medical History  Diagnosis Date  . Coronary artery stenosis   . Subdural hematoma   . 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 Cardiac defibrillator in place   . CHF (congestive heart failure)   . Diabetes mellitus without complication   . Hypertension   . Cardiomyopathy   . Hyperlipemia   . Peripheral vascular disease   . COPD (chronic obstructive pulmonary disease)   . Shortness of breath   . Dysrhythmia     hx VT  . Stroke 1998    no residual  . Automatic implantable cardioverter-defibrillator in situ   . Pacemaker   . Pneumonia 2012  . Seizures     last one maybe 2012  . History of blood transfusion   . DVT (deep venous thrombosis)   . 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 sodium chloride    . heparin 500 Units/hr (11/28/13 1247)  . niCARDipine Stopped (11/28/13 1252)    Assessment: 78 year old male s/p left carotid stent to continue anticoagulation with Heparin until 0700 on 3/27.  His initial heparin infusion rate is a little low for his weight.  Goal of Therapy:  Heparin level 0.1-0.25 units/ml Monitor platelets by anticoagulation protocol: Yes   Plan:  Increase Heparin to 650 units/hr Check Heparin level in 8 hours  Estella HuskMichelle Sandy Haye, Pharm.D., BCPS, AAHIVP Clinical Pharmacist Phone: [phone] or [phone] 11/28/2013, 1:08 PM

## 2013-11-29 ENCOUNTER — Encounter (HOSPITAL_COMMUNITY): Payer: Self-pay | Admitting: Pulmonary Disease

## 2013-11-29 ENCOUNTER — Observation Stay (HOSPITAL_COMMUNITY): Payer: PRIVATE HEALTH INSURANCE

## 2013-11-29 DIAGNOSIS — I5023 Acute on chronic systolic (congestive) heart failure: Secondary | ICD-10-CM | POA: Diagnosis not present

## 2013-11-29 DIAGNOSIS — I6350 Cerebral infarction due to unspecified occlusion or stenosis of unspecified cerebral artery: Secondary | ICD-10-CM

## 2013-11-29 LAB — POCT I-STAT 3, ART BLOOD GAS (G3+)
Acid-Base Excess: 1 mmol/L (ref 0.0–2.0)
BICARBONATE: 25.5 meq/L — AB (ref 20.0–24.0)
O2 Saturation: 81 %
PH ART: 7.428 (ref 7.350–7.450)
TCO2: 27 mmol/L (ref 0–100)
pCO2 arterial: 38 mmHg (ref 35.0–45.0)
pO2, Arterial: 41 mmHg — ABNORMAL LOW (ref 80.0–100.0)

## 2013-11-29 LAB — GLUCOSE, CAPILLARY
GLUCOSE-CAPILLARY: 99 mg/dL (ref 70–99)
Glucose-Capillary: 111 mg/dL — ABNORMAL HIGH (ref 70–99)
Glucose-Capillary: 119 mg/dL — ABNORMAL HIGH (ref 70–99)
Glucose-Capillary: 135 mg/dL — ABNORMAL HIGH (ref 70–99)
Glucose-Capillary: 92 mg/dL (ref 70–99)

## 2013-11-29 LAB — PROTIME-INR
INR: 1.2 (ref 0.00–1.49)
Prothrombin Time: 14.9 seconds (ref 11.6–15.2)

## 2013-11-29 LAB — BASIC METABOLIC PANEL
BUN: 24 mg/dL — AB (ref 6–23)
CO2: 21 mEq/L (ref 19–32)
Calcium: 8.8 mg/dL (ref 8.4–10.5)
Chloride: 100 mEq/L (ref 96–112)
Creatinine, Ser: 1.33 mg/dL (ref 0.50–1.35)
GFR calc non Af Amer: 50 mL/min — ABNORMAL LOW (ref >90)
GFR, EST AFRICAN AMERICAN: 58 mL/min — AB (ref >90)
Glucose, Bld: 137 mg/dL — ABNORMAL HIGH (ref 70–99)
POTASSIUM: 5 meq/L (ref 3.7–5.3)
SODIUM: 137 meq/L (ref 137–147)

## 2013-11-29 LAB — CREATININE, SERUM
CREATININE: 1.34 mg/dL (ref 0.50–1.35)
GFR calc Af Amer: 58 mL/min — ABNORMAL LOW (ref >90)
GFR calc non Af Amer: 50 mL/min — ABNORMAL LOW (ref >90)

## 2013-11-29 LAB — TSH: TSH: 2.761 u[IU]/mL (ref 0.350–4.500)

## 2013-11-29 LAB — CBC
HEMATOCRIT: 31.7 % — AB (ref 39.0–52.0)
HEMATOCRIT: 32.2 % — AB (ref 39.0–52.0)
HEMOGLOBIN: 10.8 g/dL — AB (ref 13.0–17.0)
Hemoglobin: 10.5 g/dL — ABNORMAL LOW (ref 13.0–17.0)
MCH: 31.3 pg (ref 26.0–34.0)
MCH: 31.5 pg (ref 26.0–34.0)
MCHC: 33.1 g/dL (ref 30.0–36.0)
MCHC: 33.5 g/dL (ref 30.0–36.0)
MCV: 94.6 fL (ref 78.0–100.0)
MCV: 93.9 fL (ref 78.0–100.0)
PLATELETS: 231 10*3/uL (ref 150–400)
Platelets: 221 10*3/uL (ref 150–400)
RBC: 3.35 MIL/uL — AB (ref 4.22–5.81)
RBC: 3.43 MIL/uL — ABNORMAL LOW (ref 4.22–5.81)
RDW: 15.9 % — ABNORMAL HIGH (ref 11.5–15.5)
RDW: 15.9 % — AB (ref 11.5–15.5)
WBC: 11.1 10*3/uL — AB (ref 4.0–10.5)
WBC: 9.5 10*3/uL (ref 4.0–10.5)

## 2013-11-29 LAB — PRO B NATRIURETIC PEPTIDE: Pro B Natriuretic peptide (BNP): 2846 pg/mL — ABNORMAL HIGH (ref 0–450)

## 2013-11-29 LAB — TROPONIN I
Troponin I: <0.3 ng/mL (ref <0.30)
Troponin I: <0.3 ng/mL (ref <0.30)

## 2013-11-29 LAB — HEPARIN LEVEL (UNFRACTIONATED): Heparin Unfractionated: <0.1 IU/mL — ABNORMAL LOW (ref 0.30–0.70)

## 2013-11-29 MED ORDER — INSULIN ASPART 100 UNIT/ML ~~LOC~~ SOLN
2.0000 [IU] | SUBCUTANEOUS | Status: DC
  Administered 2013-11-30 (×2): 4 [IU] via SUBCUTANEOUS

## 2013-11-29 MED ORDER — FUROSEMIDE 10 MG/ML IJ SOLN
40.0000 mg | Freq: Two times a day (BID) | INTRAMUSCULAR | Status: DC
Start: 2013-11-29 — End: 2013-11-30
  Administered 2013-11-29 (×2): 40 mg via INTRAVENOUS
  Filled 2013-11-29 (×3): qty 4

## 2013-11-29 MED ORDER — HEPARIN SODIUM (PORCINE) 5000 UNIT/ML IJ SOLN
5000.0000 [IU] | Freq: Three times a day (TID) | INTRAMUSCULAR | Status: DC
  Administered 2013-11-29 – 2013-12-05 (×18): 5000 [IU] via SUBCUTANEOUS
  Filled 2013-11-29 (×21): qty 1

## 2013-11-29 NOTE — Procedures (Signed)
Central Venous Catheter Insertion Procedure Note Carma LairBenigno Eduardo [id number] 1936-07-22  Procedure: Insertion of Central Venous Catheter Indications: Assessment of intravascular volume, Drug and/or fluid administration and Frequent blood sampling  Procedure Details Consent: Risks of procedure as well as the alternatives and risks of each were explained to the (patient/caregiver).  Consent for procedure obtained. Time Out: Verified patient identification, verified procedure, site/side was marked, verified correct patient position, special equipment/implants available, medications/allergies/relevent history reviewed, required imaging and test results available.  Performed  Maximum sterile technique was used including antiseptics, cap, gloves, gown, hand hygiene, mask and sheet. Skin prep: Chlorhexidine; local anesthetic administered A antimicrobial bonded/coated triple lumen catheter was placed in the right internal jugular vein using the Seldinger technique. Ultrasound guidance used.yes Catheter placed to 18 cm. Blood aspirated via all 3 ports and then flushed x 3. Line sutured x 2 and dressing applied.  Evaluation Blood flow good Complications: No apparent complications Patient did tolerate procedure well. Chest X-ray ordered to verify placement.  CXR: normal.  Joneen RoachPaul Hoffman, ACNP [facility] Pager [phone] or [phone]   cvp neded KoreauS Mcarthur Rossettianiel J. Tyson AliasFeinstein, MD, FACP Pgr: [phone] [facility]

## 2013-11-29 NOTE — Progress Notes (Signed)
ANTICOAGULATION CONSULT NOTE   Pharmacy Consult for Heparin Indication: s/p carotid stent  No Known Allergies  Patient Measurements: Height: 4\' 9"  (144.8 cm) Weight: 151 lb 7.3 oz (68.7 kg) IBW/kg (Calculated) : 43.1  Vital Signs: Temp: 97.4 F (36.3 C) (03/27 0006) Temp src: Axillary (03/27 0006) BP: 99/28 mmHg (03/27 0000) Pulse Rate: 61 (03/26 2339)  Labs:  Recent Labs  11/28/13 0650 11/28/13 2320  HGB  --  11.3*  HCT  --  33.6*  PLT  --  226  APTT 56*  --   HEPARINUNFRC  --  <0.10*  CREATININE  --  1.40*    Estimated Creatinine Clearance: 33.8 ml/min (by C-G formula based on Cr of 1.4).   Medical History: Past Medical History  Diagnosis Date  . Coronary artery stenosis   . Subdural hematoma   . 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 Cardiac defibrillator in place   . CHF (congestive heart failure)   . Diabetes mellitus without complication   . Hypertension   . Cardiomyopathy   . Hyperlipemia   . Peripheral vascular disease   . COPD (chronic obstructive pulmonary disease)   . Shortness of breath   . Dysrhythmia     hx VT  . Stroke 1998    no residual  . Automatic implantable cardioverter-defibrillator in situ   . Pacemaker   . Pneumonia 2012  . Seizures     last one maybe 2012  . History of blood transfusion   . DVT (deep venous thrombosis)   . 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 sodium chloride 10 mL/hr at 11/29/13 0000  . heparin 650 Units/hr (11/29/13 0000)  . niCARDipine Stopped (11/28/13 1252)    Assessment: 78 year old male s/p left carotid stent to continue anticoagulation with Heparin until 0700 on 3/27.  His initial heparin level is <0.1 units/ml  Goal of Therapy:  Heparin level 0.1-0.25 units/ml Monitor platelets by anticoagulation protocol: Yes   Plan:  Increase Heparin to 750 units/hr Heparin off at 07:00  Talbert CageLora Hymie Gorr, Pharm.D. Clinical Pharmacist Phone:  [phone] 11/29/2013, 12:36 AM

## 2013-11-29 NOTE — Consult Note (Signed)
PULMONARY / CRITICAL CARE MEDICINE   Name: Francisco Brock MRN: [id number] DOB: 13-Jan-1936    ADMISSION DATE:  11/28/2013 CONSULTATION DATE:  11/29/2013  REFERRING MD :  [facility] PRIMARY SERVICE: PCCM  CHIEF COMPLAINT:  hypoxia  BRIEF PATIENT DESCRIPTION: 78 year old male presented 3/26 for cerebral arteriogram and L ICA stent. Post procedure he complained of SOB. Admitted to [facility]. Then became hypotensive and hypotensve, PCCM asked to see.   SIGNIFICANT EVENTS / STUDIES:  3/26 Cerebrel Arteriogram - several arteries with stenosis, L ICA stent placed.   LINES / TUBES: RIJ CVL 3/27 >>> L fem A Line 3/27 >>>  CULTURES: None  ANTIBIOTICS: None  HISTORY OF PRESENT ILLNESS:  78 year old male with PMH as below which includes CVA, seizure, CHF, DVT, CAD, PAD, andbilateral leg amputations. He also has an ICD. 3/26 he presented to radiology for bilateral carotid arteriogram and right vertebral artery angiogram with findings of right vertebral artery origin stenosis and severe left ICA proximal stenosis. He had left ICA stent assisted angioplasty. He was admitted to [facility]. He was mildly short of breath at that time. He became increasingly dyspneic and hypoxic, and required 100% FiO2 to maintain adequate saturations. PCCM was asked to see.    PAST MEDICAL HISTORY :  Past Medical History  Diagnosis Date  . Coronary artery stenosis   . Subdural hematoma   . 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 Cardiac defibrillator in place   . CHF (congestive heart failure)   . Diabetes mellitus without complication   . Hypertension   . Cardiomyopathy   . Hyperlipemia   . Peripheral vascular disease   . COPD (chronic obstructive pulmonary disease)   . Shortness of breath   . Dysrhythmia     hx VT  . Stroke 1998    no residual  . Automatic implantable cardioverter-defibrillator in situ   . Pacemaker   . Pneumonia 2012  . Seizures     last one maybe 2012  . History of blood  transfusion   . DVT (deep venous thrombosis)   . 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 Leg amputation above knee      right  . Leg amputation below knee      left  . Coronary artery bypass graft  2004  . Cardiac defibrillator placement  2007  . Balloon angioplasty, artery      rt lower leg  . Carotid endarterectomy      bilateral  . Amputation  06/28/2012    Procedure: AMPUTATION DIGIT;  Surgeon: Tami Ribas, MD;  Location: [facility] OR;  Service: Orthopedics;  Laterality: Left;  left  distal index finger  . Eye surgery Bilateral    Prior to Admission medications   Medication Sig Start Date End Date Taking? Authorizing Provider  aspirin 81 MG chewable tablet Chew 81 mg by mouth daily.   Yes Historical Provider, MD  bisacodyl (DULCOLAX) 10 MG suppository Place 10 mg rectally daily as needed for mild constipation or moderate constipation.   Yes Historical Provider, MD  carvedilol (COREG) 25 MG tablet Take 25 mg by mouth 2 (two) times daily with a meal.   Yes Historical Provider, MD  Cholecalciferol (VITAMIN D3) 50000 UNITS CAPS Take 50,000 Units by mouth every 30 (thirty) days. On the 15th of every month   Yes Historical  Provider, MD  clopidogrel (PLAVIX) 75 MG tablet Take 75 mg by mouth daily.   Yes Historical Provider, MD  digoxin (LANOXIN) 0.125 MG tablet Take 0.0625 mg by mouth daily.    Yes Historical Provider, MD  enalapril (VASOTEC) 5 MG tablet Take 0.5 tablets (2.5 mg total) by mouth daily. 03/20/13  Yes Sharee Holster, NP  eplerenone (INSPRA) 25 MG tablet Take 25 mg by mouth daily.   Yes Historical Provider, MD  HYDROcodone-acetaminophen (NORCO/VICODIN) 5-325 MG per tablet Take 1-2 tablets by mouth every 6 (six) hours as needed for moderate pain.  06/28/12  Yes Tami Ribas, MD  Magnesium Hydroxide (MILK OF MAGNESIA PO) Take 30 mLs by mouth daily as needed (constipation).   Yes Historical Provider, MD  metFORMIN  (GLUCOPHAGE) 850 MG tablet Take 850 mg by mouth daily with breakfast.   Yes Historical Provider, MD  Multiple Vitamins-Minerals (MULTIVITAMIN WITH MINERALS) tablet Take 1 tablet by mouth daily.   Yes Historical Provider, MD  protective barrier (RESTORE) CREA Apply 1 application topically 3 (three) times daily as needed (to perlarea once a shift and after each incontinent episode).   Yes Historical Provider, MD  ranolazine (RANEXA) 500 MG 12 hr tablet Take 500 mg by mouth 2 (two) times daily.   Yes Historical Provider, MD  rosuvastatin (CRESTOR) 40 MG tablet Take 20 mg by mouth at bedtime.    Yes Historical Provider, MD   No Known Allergies  FAMILY HISTORY:  History reviewed. No pertinent family history. SOCIAL HISTORY:  reports that he has quit smoking. His smoking use included Cigars. He does not have any smokeless tobacco history on file. He reports that he does not drink alcohol or use illicit drugs.  REVIEW OF SYSTEMS:   Bolds are positive  Constitutional: weight loss, gain, night sweats, Fevers, chills, fatigue .  HEENT: headaches, Sore throat, sneezing, nasal congestion, post nasal drip, Difficulty swallowing, Tooth/dental problems, visual complaints visual changes, ear ache CV:  chest pain, radiates: ,Orthopnea, PND, swelling in lower extremities, dizziness, palpitations, syncope.  GI  heartburn, indigestion, abdominal pain, nausea, vomiting, diarrhea, change in bowel habits, loss of appetite, bloody stools.  Resp: cough, productive: , hemoptysis, dyspnea, chest pain, pleuritic.  Skin: rash or itching or icterus GU: dysuria, change in color of urine, urgency or frequency. flank pain, hematuria  MS: joint pain or swelling. decreased range of motion  Psych: change in mood or affect. depression or anxiety.  Neuro: difficulty with speech, weakness, numbness, ataxia    SUBJECTIVE:   VITAL SIGNS: Temp:  [96 F (35.6 C)-98.6 F (37 C)] 96 F (35.6 C) (03/27 0759) Pulse Rate:   [40-142] 49 (03/27 0700) Resp:  [15-32] 16 (03/27 0700) BP: (87-154)/(15-97) 92/27 mmHg (03/27 0700) SpO2:  [86 %-100 %] 92 % (03/27 0700) FiO2 (%):  [55 %] 55 % (03/27 0403) Weight:  [68.7 kg (151 lb 7.3 oz)] 68.7 kg (151 lb 7.3 oz) (03/26 1500) HEMODYNAMICS:   VENTILATOR SETTINGS: Vent Mode:  [-]  FiO2 (%):  [55 %] 55 % INTAKE / OUTPUT: Intake/Output     03/26 0701 - 03/27 0700 03/27 0701 - 03/28 0700   P.O. 240    I.V. (mL/kg) 655.7 (9.5)    Other 210    Total Intake(mL/kg) 1105.7 (16.1)    Urine (mL/kg/hr) 1350 (0.8)    Blood 200 (0.1)    Total Output 1550     Net -444.3          Urine Occurrence 5 x  PHYSICAL EXAMINATION: General:  Acutely ill appearing male in mild respiratory distress Neuro:  Alert oriented, mild confusion HEENT:  NA/AT PERRL Cardiovascular:  Brady s1 s 2 Lungs:  Coarse throughout Abdomen:  Soft, non tender Musculoskeletal:  Bilateral leg amputations.  Skin:  Intact  LABS:  CBC  Recent Labs Lab 11/22/13 1454 11/28/13 2320 11/29/13 0622  WBC 10.3 11.8* PENDING  HGB 11.9* 11.3* 10.5*  HCT 35.5* 33.6* 31.7*  PLT 249 226 231   Coag's  Recent Labs Lab 11/22/13 1454 11/28/13 0650  APTT 68* 56*  INR 1.17  --    BMET  Recent Labs Lab 11/22/13 1454 11/28/13 2320 11/29/13 0323  NA 137 136* 137  K 5.2 4.3 5.0  CL 103 99 100  CO2 19 24 21   BUN 22 23 24*  CREATININE 1.21 1.40* 1.33  GLUCOSE 171* 193* 137*   Electrolytes  Recent Labs Lab 11/22/13 1454 11/28/13 2320 11/29/13 0323  CALCIUM 9.2 8.7 8.8  MG  --  1.7  --    Sepsis Markers No results found for this basename: LATICACIDVEN, PROCALCITON, O2SATVEN,  in the last 168 hours ABG  Recent Labs Lab 11/28/13 2321  PHART 7.414  PCO2ART 37.6  PO2ART 37.0*   Liver Enzymes  Recent Labs Lab 11/22/13 1454  AST 15  ALT 9  ALKPHOS 68  BILITOT 0.4  ALBUMIN 3.3*   Cardiac Enzymes No results found for this basename: TROPONINI, PROBNP,  in the last 168  hours Glucose  Recent Labs Lab 11/28/13 0627 11/28/13 0834 11/28/13 1209 11/28/13 1704 11/28/13 2155  GLUCAP 150* 150* 122* 131* 182*    Imaging Dg Chest Port 1 View  11/29/2013   CLINICAL DATA:  Evaluate infiltrates.  EXAM: PORTABLE CHEST - 1 VIEW  COMPARISON:  Chest x-ray 11/28/2013.  FINDINGS: Previously noted mild pulmonary edema has resolved. There continues to be some pulmonary venous congestion. Bibasilar opacities (left greater than right) are favored to predominantly reflect underlying subsegmental atelectasis, although airspace consolidation, particularly in the left lower lobe, is difficult to exclude. Small left pleural effusion. Heart size is mildly enlarged. The patient is rotated to the left on today's exam, resulting in distortion of the mediastinal contours and reduced diagnostic sensitivity and specificity for mediastinal pathology. Atherosclerosis in the thoracic aorta. Left-sided pacemaker/AICD with lead tips projecting over the expected location of the right atrium and right ventricular apex. Status post median sternotomy.  IMPRESSION: 1. Compared with yesterday's examination, the previously noted pulmonary edema has resolved. The radiographic appearance the chest is otherwise essentially unchanged, as above.   Electronically Signed   By: Trudie Reed M.D.   On: 11/29/2013 02:26   Dg Chest Port 1v Same Day  11/28/2013   CLINICAL DATA:  Shortness of breath.  EXAM: PORTABLE CHEST - 1 VIEW SAME DAY  COMPARISON:  Chest x-ray 11/22/2013  FINDINGS: Prior CABG. Cardiac pacer noted with lead tips in right atrium and right ventricle. Severe cardiomegaly. Interim appearance of severe pulmonary venous congestion and interstitial prominence with left-sided pleural effusion. These findings are consistent with congestive heart failure and pulmonary edema. No pneumothorax. No acute osseus abnormality.  IMPRESSION: Severe congestive heart failure with pulmonary edema and left-sided pleural  effusion .   Electronically Signed   By: Maisie Fus  Register   On: 11/28/2013 16:23     CXR: Diffuse bilateral infiltrates consistent with pulmonary edema.   ASSESSMENT / PLAN:  PULMONARY A: Acute Respiratory Failure Pulmonary Edema secondary to CHF COPD without evidence of acute  exacerbation  P:   Supplemental O2, wean as tolerated SpO2 Goal 92% Follow CXR, ABG cvp See cvs Diurese as BP tolerates May need NIMV If not improved with lasix, ct angio  CARDIOVASCULAR A:  Acute on chronic systolic CHF LVEF 30-35% in 2011 S/p Left ICA stent  Hypotension H/o CAD, PVD  P:  2D echo Check BNP Hold antihypertensives Insert A-line for accurate BP and frequent ABG Insert CVL for CVP monitoring  Cont Plavix, aldactone, statin Diurese as in pulmonary section Trop assessment Tsh\ May need rate increase pacer   RENAL A:   Fluid volume overload  P:   IVF KVO Follow BMP  GASTROINTESTINAL A:   No acute issue  P:   PPx not indicated at this time  HEMATOLOGIC A:   On Plavix for stent  P:  Follow CBC coags  INFECTIOUS A:   No acute issue  P:   Follow WBC and fever curve  ENDOCRINE A:   DM R/o ral AI P:   q4 CBG and SSI while NPO Cortisol tsh  NEUROLOGIC A:   Acute encephalopathy  P:   Monitor Supportive Care  TODAY'S SUMMARY: Will manage patient in ICU. Plan to diurese with IV lasix and follow respiratory status. Currently does not appear that he will need intubation. A line, cvp, To pccm service, d/w IR  I have personally obtained a history, examined the patient, evaluated laboratory and imaging results, formulated the assessment and plan and placed orders.   Mcarthur Rossettianiel J. Tyson AliasFeinstein, MD, FACP Pgr: [phone] [place] [facility]  Joneen RoachPaul Hoffman, ACNP [facility] Pager [phone] or [phone]   11/29/2013, 8:21 AM

## 2013-11-29 NOTE — Progress Notes (Signed)
1 Day Post-Op  Subjective: L ICA pta/stent 3/26 Pt admitted overnight to Dr Corliss Skainseveshwar [facility] helping to mange medically Hypotensive last pm-- CCM following Now alert; 105/36  Objective: Vital signs in last 24 hours: Temp:  [96 F (35.6 C)-98.6 F (37 C)] 96 F (35.6 C) (03/27 0759) Pulse Rate:  [40-142] 49 (03/27 0700) Resp:  [15-32] 16 (03/27 0700) BP: (87-154)/(15-97) 92/27 mmHg (03/27 0700) SpO2:  [86 %-100 %] 92 % (03/27 0700) FiO2 (%):  [55 %] 55 % (03/27 0403) Weight:  [68.7 kg (151 lb 7.3 oz)] 68.7 kg (151 lb 7.3 oz) (03/26 1500)    Intake/Output from previous day: 03/26 0701 - 03/27 0700 In: 1105.7 [P.O.:240; I.V.:655.7] Out: 1550 [Urine:1350; Blood:200] Intake/Output this shift:    PE:  Afeb; vss 105/36 Using O2 mask - nonrebreather Alert; following all commands Face symmetrical; smile = Tongue midline C collar in place Rt groin NT; no bleeding; no hematoma Moves all 4s  Good grip strength =    Lab Results:   Recent Labs  11/28/13 2320 11/29/13 0622  WBC 11.8* 11.1*  HGB 11.3* 10.5*  HCT 33.6* 31.7*  PLT 226 231   BMET  Recent Labs  11/28/13 2320 11/29/13 0323  NA 136* 137  K 4.3 5.0  CL 99 100  CO2 24 21  GLUCOSE 193* 137*  BUN 23 24*  CREATININE 1.40* 1.33  CALCIUM 8.7 8.8   PT/INR No results found for this basename: LABPROT, INR,  in the last 72 hours ABG  Recent Labs  11/28/13 2321  PHART 7.414  HCO3 24.1*    Studies/Results: Dg Chest Port 1 View  11/29/2013   CLINICAL DATA:  Decreased saturations, history CHF, diabetes, hypertension, cardiomyopathy, COPD  EXAM: PORTABLE CHEST - 1 VIEW  COMPARISON:  Portable exam 0815 hr compared to 11/29/2013 at 0136 hr  FINDINGS: Left subclavian AICD leads project over right atrium and right ventricle.  Enlargement of cardiac silhouette with pulmonary vascular congestion post median sternotomy.  Sternotomy wires are fractured.  Atherosclerotic calcification aorta.  Perihilar infiltrates most  consistent with pulmonary edema.  No gross pleural effusion or pneumothorax.  Bones demineralized.  IMPRESSION: CHF, with pulmonary edema increased since previous study.   Electronically Signed   By: Ulyses SouthwardMark  Boles M.D.   On: 11/29/2013 08:32   Dg Chest Port 1 View  11/29/2013   CLINICAL DATA:  Evaluate infiltrates.  EXAM: PORTABLE CHEST - 1 VIEW  COMPARISON:  Chest x-ray 11/28/2013.  FINDINGS: Previously noted mild pulmonary edema has resolved. There continues to be some pulmonary venous congestion. Bibasilar opacities (left greater than right) are favored to predominantly reflect underlying subsegmental atelectasis, although airspace consolidation, particularly in the left lower lobe, is difficult to exclude. Small left pleural effusion. Heart size is mildly enlarged. The patient is rotated to the left on today's exam, resulting in distortion of the mediastinal contours and reduced diagnostic sensitivity and specificity for mediastinal pathology. Atherosclerosis in the thoracic aorta. Left-sided pacemaker/AICD with lead tips projecting over the expected location of the right atrium and right ventricular apex. Status post median sternotomy.  IMPRESSION: 1. Compared with yesterday's examination, the previously noted pulmonary edema has resolved. The radiographic appearance the chest is otherwise essentially unchanged, as above.   Electronically Signed   By: Trudie Reedaniel  Entrikin M.D.   On: 11/29/2013 02:26   Dg Chest Port 1v Same Day  11/28/2013   CLINICAL DATA:  Shortness of breath.  EXAM: PORTABLE CHEST - 1 VIEW SAME DAY  COMPARISON:  Chest x-ray 11/22/2013  FINDINGS: Prior CABG. Cardiac pacer noted with lead tips in right atrium and right ventricle. Severe cardiomegaly. Interim appearance of severe pulmonary venous congestion and interstitial prominence with left-sided pleural effusion. These findings are consistent with congestive heart failure and pulmonary edema. No pneumothorax. No acute osseus abnormality.   IMPRESSION: Severe congestive heart failure with pulmonary edema and left-sided pleural effusion .   Electronically Signed   By: Maisie Fus  Register   On: 11/28/2013 16:23    Anti-infectives: Anti-infectives   Start     Dose/Rate Route Frequency Ordered Stop   11/28/13 0657  ceFAZolin (ANCEF) 2-3 GM-% IVPB SOLR    Comments:  Ray Church   : cabinet override      11/28/13 9604 11/28/13 1914      Assessment/Plan: s/p Procedure(s): ANGIO WITH CAROTID STENT  (N/A)  L Internal carotid artery pta/stent 3/26 Stable Will report to Dr Corliss Skains CCM to see pt today Await recommendations   LOS: 1 day    Mccartney Brucks A 11/29/2013

## 2013-11-29 NOTE — Plan of Care (Cosign Needed)
SBP in the 90's with brady cardia HR in 40s. Have dc'd Coreg and digoxin as well as Lasix until can be re evaluated by attending MD. Also apparently mild hypothermia with the lower CO readings.  Junious SilkAllison Ellis, ANP

## 2013-11-29 NOTE — Progress Notes (Signed)
Patient's respiratory status progressively declined and was placed on a 100% non-rea breather from 4L [place], HR in the 30s per monitor non-sustained, BP 119/17 (40), patient somnulent but still arousable and follows commands. Kirtland BouchardK. Schorr/ kakrakandy paged, both at bedside. ABG, EKG, Chest X-ray and labs ordered. Cardiology consulted.Manual BP obtained 100/30.    K.Schoor paged again in regards to patient's BP 87/27 (43) at 0100. No new orders placed. Continue to monitor

## 2013-11-29 NOTE — Progress Notes (Addendum)
Called [facility] MD to make them aware of pt's low DBP. Pt is Arousable & oriented X4.  Pt has adequate urine out put.  MD is ok with pt DBP being the 30's if the pt remains asymptomatic (Arousable & oriented X4), SBP remains over 100, and pt continues to have adequate U/O. MD confirmed that it is fine to give the ordered lasix dose of 40mg .

## 2013-11-29 NOTE — Procedures (Signed)
Arterial Catheter Insertion Procedure Note Francisco LairBenigno Brock [id number] 1936-01-24  Procedure: Insertion of Arterial Catheter  Indications: Blood pressure monitoring  Procedure Details Consent: Risks of procedure as well as the alternatives and risks of each were explained to the (patient/caregiver).  Consent for procedure obtained. Time Out: Verified patient identification, verified procedure, site/side was marked, verified correct patient position, special equipment/implants available, medications/allergies/relevent history reviewed, required imaging and test results available.  Performed  Maximum sterile technique was used including antiseptics, cap, gloves, gown, hand hygiene, mask and sheet. Skin prep: Chlorhexidine; local anesthetic administered 20 gauge catheter was inserted into rleft  femoral artery using the Seldinger technique.  Evaluation Blood flow good; BP tracing good. Complications: No apparent complications.   Francisco Brock,Francisco J. 11/29/2013   US  Mcarthur Rossettianiel J. Brock AliasFeinstein, MD, FACP Pgr: [phone] [place] [facility]

## 2013-11-30 ENCOUNTER — Inpatient Hospital Stay (HOSPITAL_COMMUNITY): Payer: PRIVATE HEALTH INSURANCE

## 2013-11-30 DIAGNOSIS — I359 Nonrheumatic aortic valve disorder, unspecified: Secondary | ICD-10-CM

## 2013-11-30 LAB — CBC
HCT: 34.1 % — ABNORMAL LOW (ref 39.0–52.0)
Hemoglobin: 11.2 g/dL — ABNORMAL LOW (ref 13.0–17.0)
MCH: 30.7 pg (ref 26.0–34.0)
MCHC: 32.8 g/dL (ref 30.0–36.0)
MCV: 93.4 fL (ref 78.0–100.0)
Platelets: 231 10*3/uL (ref 150–400)
RBC: 3.65 MIL/uL — ABNORMAL LOW (ref 4.22–5.81)
RDW: 15.6 % — AB (ref 11.5–15.5)
WBC: 12.4 10*3/uL — ABNORMAL HIGH (ref 4.0–10.5)

## 2013-11-30 LAB — GLUCOSE, CAPILLARY
GLUCOSE-CAPILLARY: 103 mg/dL — AB (ref 70–99)
GLUCOSE-CAPILLARY: 297 mg/dL — AB (ref 70–99)
Glucose-Capillary: 106 mg/dL — ABNORMAL HIGH (ref 70–99)
Glucose-Capillary: 167 mg/dL — ABNORMAL HIGH (ref 70–99)
Glucose-Capillary: 164 mg/dL — ABNORMAL HIGH (ref 70–99)
Glucose-Capillary: 281 mg/dL — ABNORMAL HIGH (ref 70–99)

## 2013-11-30 LAB — BASIC METABOLIC PANEL
BUN: 23 mg/dL (ref 6–23)
CALCIUM: 9 mg/dL (ref 8.4–10.5)
CO2: 24 mEq/L (ref 19–32)
CREATININE: 1.42 mg/dL — AB (ref 0.50–1.35)
Chloride: 96 mEq/L (ref 96–112)
GFR, EST AFRICAN AMERICAN: 54 mL/min — AB (ref >90)
GFR, EST NON AFRICAN AMERICAN: 46 mL/min — AB (ref >90)
Glucose, Bld: 124 mg/dL — ABNORMAL HIGH (ref 70–99)
Potassium: 3.8 mEq/L (ref 3.7–5.3)
Sodium: 136 mEq/L — ABNORMAL LOW (ref 137–147)

## 2013-11-30 LAB — CORTISOL: CORTISOL PLASMA: 9.1 ug/dL

## 2013-11-30 LAB — TSH: TSH: 2.619 u[IU]/mL (ref 0.350–4.500)

## 2013-11-30 LAB — TROPONIN I

## 2013-11-30 MED ORDER — INSULIN ASPART 100 UNIT/ML ~~LOC~~ SOLN: 0.0000 [IU] | Freq: Every day | SUBCUTANEOUS | Status: DC

## 2013-11-30 MED ORDER — HYDRALAZINE HCL 20 MG/ML IJ SOLN: 10.0000 mg | INTRAMUSCULAR | Status: DC | PRN

## 2013-11-30 MED ORDER — INSULIN ASPART 100 UNIT/ML ~~LOC~~ SOLN
0.0000 [IU] | SUBCUTANEOUS | Status: DC
  Administered 2013-11-30: 11 [IU] via SUBCUTANEOUS
  Administered 2013-12-01 (×2): 7 [IU] via SUBCUTANEOUS
  Administered 2013-12-01: 3 [IU] via SUBCUTANEOUS
  Administered 2013-12-01: 7 [IU] via SUBCUTANEOUS
  Administered 2013-12-01 – 2013-12-02 (×2): 4 [IU] via SUBCUTANEOUS

## 2013-11-30 MED ORDER — FUROSEMIDE 10 MG/ML IJ SOLN
40.0000 mg | Freq: Four times a day (QID) | INTRAMUSCULAR | Status: AC
  Administered 2013-11-30 (×2): 40 mg via INTRAVENOUS

## 2013-11-30 MED ORDER — INSULIN ASPART 100 UNIT/ML ~~LOC~~ SOLN: 0.0000 [IU] | Freq: Three times a day (TID) | SUBCUTANEOUS | Status: DC

## 2013-11-30 NOTE — Progress Notes (Signed)
PULMONARY / CRITICAL CARE MEDICINE   Name: Francisco Brock MRN: [id number] DOB: 17-Nov-1935    ADMISSION DATE:  11/28/2013 CONSULTATION DATE:  11/29/2013  [facility]  [facility]: [facility]M  CHIEF COMPLAINT:  hypoxia  BRIEF PATIENT DESCRIPTION: 78 year old male presented 3/26 for cerebral arteriogram and L ICA stent. Post procedure he complained of SOB. [facility] Then became hypotensive and hypotensve, PCCM asked to see.   SIGNIFICANT EVENTS / STUDIES:  3/26 Cerebrel Arteriogram - several arteries with stenosis, L ICA stent placed.   LINES / TUBES: RIJ CVL 3/27 >>> L fem A Line 3/27 >>>  CULTURES: None  ANTIBIOTICS: None  SUBJECTIVE:  Feels better this morning, hungry, wants to eat, denies chest pain or cough  VITAL SIGNS: Temp:  [96.1 F (35.6 C)-97.8 F (36.6 C)] 97.6 F (36.4 C) (03/28 0837) Pulse Rate:  [44-73] 70 (03/28 0900) Resp:  [13-29] 18 (03/28 0900) BP: (92-123)/(18-70) 94/70 mmHg (03/28 0900) SpO2:  [97 %-100 %] 99 % (03/28 0900) FiO2 (%):  [100 %] 100 % (03/28 0400) HEMODYNAMICS: CVP:  [5 mmHg-11 mmHg] 8 mmHg VENTILATOR SETTINGS: Vent Mode:  [-]  FiO2 (%):  [100 %] 100 % INTAKE / OUTPUT: Intake/Output     03/27 0701 - 03/28 0700 03/28 0701 - 03/29 0700   P.O.  60   I.V. (mL/kg) 340 (4.9) 40 (0.6)   Other     Total Intake(mL/kg) 340 (4.9) 100 (1.5)   Urine (mL/kg/hr) 2140 (1.3)    Blood     Total Output 2140     Net -1800 +100        Urine Occurrence 2 x      PHYSICAL EXAMINATION: General: no respiratory distress HEENT: NCAT, EOMi, OP clear PULM: few crackles in bases, diminished breath sounds CV: RRR, no mgr, AB: BS+, soft, nontender, no hsm Ext: s/p bka L, s/p AKA R Neuro: Awake and alert, answering questions, moves arms/hand  normally   LABS:  CBC  Recent Labs Lab 11/29/13 0622 11/29/13 1430 11/30/13 0500  WBC 11.1* 9.5 12.4*  HGB 10.5* 10.8* 11.2*  HCT 31.7* 32.2* 34.1*  PLT 231 221 231   Coag's  Recent  Labs Lab 11/28/13 0650 11/29/13 1200  APTT 56*  --   INR  --  1.20   BMET  Recent Labs Lab 11/28/13 2320 11/29/13 0323 11/29/13 1430 11/30/13 0500  NA 136* 137  --  136*  K 4.3 5.0  --  3.8  CL 99 100  --  96  CO2 24 21  --  24  BUN 23 24*  --  23  CREATININE 1.40* 1.33 1.34 1.42*  GLUCOSE 193* 137*  --  124*   Electrolytes  Recent Labs Lab 11/28/13 2320 11/29/13 0323 11/30/13 0500  CALCIUM 8.7 8.8 9.0  MG 1.7  --   --    Sepsis Markers No results found for this basename: LATICACIDVEN, PROCALCITON, O2SATVEN,  in the last 168 hours ABG  Recent Labs Lab 11/28/13 2321 11/29/13 0815  PHART 7.414 7.428  PCO2ART 37.6 38.0  PO2ART 37.0* 41.0*   Liver Enzymes No results found for this basename: AST, ALT, ALKPHOS, BILITOT, ALBUMIN,  in the last 168 hours Cardiac Enzymes  Recent Labs Lab 11/29/13 1403 11/29/13 1500 11/29/13 2003 11/30/13 0203  TROPONINI <0.30  --  <0.30 <0.30  PROBNP  --  2846.0*  --   --    Glucose  Recent Labs Lab 11/29/13 1401 11/29/13 1557 11/29/13 1947  11/29/13 2339 11/30/13 0326 11/30/13 0733  GLUCAP 99 111* 92 119* 106* 103*    Imaging  CXR: Diffuse bilateral infiltrates consistent with pulmonary edema.   ASSESSMENT / PLAN:  PULMONARY A: Acute Respiratory Failure > improving Pulmonary Edema secondary to CHF COPD without evidence of acute exacerbation  P:   Supplemental O2, wean as tolerated SpO2 Goal 92% Repeat CXR 3/28 AM cvp Continue lasix today  CARDIOVASCULAR A:  Acute on chronic systolic CHF LVEF 30-35% in 2011 S/p Left ICA stent  Hypotension H/o CAD, PVD  P:  F/u 2D echo Restart home enalapril 3/29 if pressure, renal function allows Continue diuresis today Hold antihypertensives   RENAL A:   Fluid volume overload CKD, baseline Cr 1.2-1.4 P:   Monitor UOP and BMET with diuresis  GASTROINTESTINAL A:   No acute issue  P:   PPx not indicated at this time Advance diet  3/28  HEMATOLOGIC A:   On Plavix for stent  P:  Follow CBC  INFECTIOUS A:   No acute issue  P:   Follow WBC and fever curve  ENDOCRINE A:   DM P:   q4 CBG and SSI May need long acting insulin with diet tsh  NEUROLOGIC A:   Acute encephalopathy > improved  P:   Monitor Supportive Care  TODAY'S SUMMARY: wean O2, continue diurese, advance diet   Yolonda Kida PCCM Pager: [phone] Cell: [phone] If no response, call [phone]    11/30/2013, 9:53 AM

## 2013-11-30 NOTE — Progress Notes (Signed)
Echocardiogram 2D Echocardiogram has been performed.  Dorothey BasemanReel, Jovaun Levene M 11/30/2013, 7:51 AM

## 2013-11-30 NOTE — Progress Notes (Signed)
Spoke to eLink MD due to pt's CBG = 297. MD gave orders to d/c standard scale and implement pt to Resistant scale.

## 2013-12-01 DIAGNOSIS — E876 Hypokalemia: Secondary | ICD-10-CM

## 2013-12-01 DIAGNOSIS — Z95810 Presence of automatic (implantable) cardiac defibrillator: Secondary | ICD-10-CM

## 2013-12-01 DIAGNOSIS — I5023 Acute on chronic systolic (congestive) heart failure: Principal | ICD-10-CM

## 2013-12-01 DIAGNOSIS — I255 Ischemic cardiomyopathy: Secondary | ICD-10-CM | POA: Diagnosis present

## 2013-12-01 LAB — GLUCOSE, CAPILLARY
GLUCOSE-CAPILLARY: 91 mg/dL (ref 70–99)
GLUCOSE-CAPILLARY: 155 mg/dL — AB (ref 70–99)
GLUCOSE-CAPILLARY: 215 mg/dL — AB (ref 70–99)
Glucose-Capillary: 125 mg/dL — ABNORMAL HIGH (ref 70–99)
Glucose-Capillary: 201 mg/dL — ABNORMAL HIGH (ref 70–99)
Glucose-Capillary: 213 mg/dL — ABNORMAL HIGH (ref 70–99)
Glucose-Capillary: 92 mg/dL (ref 70–99)

## 2013-12-01 LAB — CBC WITH DIFFERENTIAL/PLATELET
Basophils Absolute: 0 10*3/uL (ref 0.0–0.1)
Basophils Relative: 0 % (ref 0–1)
EOS ABS: 0.3 10*3/uL (ref 0.0–0.7)
Eosinophils Relative: 2 % (ref 0–5)
HEMATOCRIT: 33 % — AB (ref 39.0–52.0)
HEMOGLOBIN: 11.1 g/dL — AB (ref 13.0–17.0)
LYMPHS ABS: 1.8 10*3/uL (ref 0.7–4.0)
LYMPHS PCT: 15 % (ref 12–46)
MCH: 31.2 pg (ref 26.0–34.0)
MCHC: 33.6 g/dL (ref 30.0–36.0)
MCV: 92.7 fL (ref 78.0–100.0)
MONO ABS: 1.4 10*3/uL — AB (ref 0.1–1.0)
MONOS PCT: 11 % (ref 3–12)
Neutro Abs: 8.7 10*3/uL — ABNORMAL HIGH (ref 1.7–7.7)
Neutrophils Relative %: 72 % (ref 43–77)
Platelets: 225 10*3/uL (ref 150–400)
RBC: 3.56 MIL/uL — AB (ref 4.22–5.81)
RDW: 15.5 % (ref 11.5–15.5)
WBC: 12.2 10*3/uL — AB (ref 4.0–10.5)

## 2013-12-01 LAB — BASIC METABOLIC PANEL
BUN: 26 mg/dL — ABNORMAL HIGH (ref 6–23)
CHLORIDE: 96 meq/L (ref 96–112)
CO2: 25 meq/L (ref 19–32)
Calcium: 8.8 mg/dL (ref 8.4–10.5)
Creatinine, Ser: 1.37 mg/dL — ABNORMAL HIGH (ref 0.50–1.35)
GFR calc Af Amer: 56 mL/min — ABNORMAL LOW (ref >90)
GFR calc non Af Amer: 49 mL/min — ABNORMAL LOW (ref >90)
Glucose, Bld: 151 mg/dL — ABNORMAL HIGH (ref 70–99)
Potassium: 3.3 mEq/L — ABNORMAL LOW (ref 3.7–5.3)
SODIUM: 136 meq/L — AB (ref 137–147)

## 2013-12-01 MED ORDER — FUROSEMIDE 10 MG/ML IJ SOLN
80.0000 mg | Freq: Every day | INTRAMUSCULAR | Status: DC
  Administered 2013-12-01: 80 mg via INTRAVENOUS
  Filled 2013-12-01 (×2): qty 8

## 2013-12-01 MED ORDER — POTASSIUM CHLORIDE CRYS ER 20 MEQ PO TBCR
20.0000 meq | EXTENDED_RELEASE_TABLET | ORAL | Status: AC
  Administered 2013-12-01 (×2): 20 meq via ORAL
  Filled 2013-12-01 (×2): qty 1

## 2013-12-01 MED ORDER — FUROSEMIDE 40 MG PO TABS
40.0000 mg | ORAL_TABLET | Freq: Every day | ORAL | Status: DC
  Administered 2013-12-01: 40 mg via ORAL
  Filled 2013-12-01: qty 1

## 2013-12-01 NOTE — Progress Notes (Signed)
PULMONARY / CRITICAL CARE MEDICINE   Name: Francisco Brock MRN: [id number] DOB: 02/13/1936    ADMISSION DATE:  11/28/2013 CONSULTATION DATE:  11/29/2013  [facility]: [facility]  CHIEF COMPLAINT:  hypoxia  BRIEF PATIENT DESCRIPTION: [age] year old male presented 3/26 for cerebral arteriogram and L ICA stent. Post procedure he complained of SOB. [facility] Then became hypotensive and hypotensve, PCCM asked to see.   SIGNIFICANT EVENTS / STUDIES:  3/26 Cerebral Arteriogram - several arteries with stenosis, L ICA stent placed.   LINES / TUBES: RIJ CVL 3/27 >>> L fem A Line 3/27 >>>  CULTURES: None  ANTIBIOTICS: None  SUBJECTIVE:  Breathing better, ate well yesterday, no complaints  VITAL SIGNS: Temp:  [97.9 F (36.6 C)-98.7 F (37.1 C)] 98.1 F (36.7 C) (03/29 0741) Pulse Rate:  [43-132] 68 (03/29 0900) Resp:  [12-27] 18 (03/29 0900) BP: (65-122)/(20-69) 115/29 mmHg (03/29 0900) SpO2:  [82 %-100 %] 98 % (03/29 0900) HEMODYNAMICS: CVP:  [5 mmHg-7 mmHg] 7 mmHg VENTILATOR SETTINGS:   INTAKE / OUTPUT: Intake/Output     03/28 0701 - 03/29 0700 03/29 0701 - 03/30 0700   P.O. 1380 240   I.V. (mL/kg) 480 (7) 40 (0.6)   Total Intake(mL/kg) 1860 (27.1) 280 (4.1)   Urine (mL/kg/hr) 1855 (1.1) 200 (1.3)   Total Output 1855 200   Net +5 +80          PHYSICAL EXAMINATION: General: no respiratory distress HEENT: NCAT, EOMi, OP clear PULM: CTA B today  CV: RRR, no mgr, AB: BS+, soft, nontender, no hsm Ext: s/p bka L, s/p AKA R Neuro: Awake and alert, answering questions, moves arms/hand  normally   LABS:  CBC  Recent Labs Lab 11/29/13 1430 11/30/13 0500 12/01/13 0544  WBC 9.5 12.4* 12.2*  HGB 10.8* 11.2* 11.1*  HCT 32.2* 34.1* 33.0*  PLT 221 231 225   Coag's  Recent Labs Lab 11/28/13 0650 11/29/13 1200  APTT 56*  --   INR  --  1.20   BMET  Recent Labs Lab 11/29/13 0323 11/29/13 1430 11/30/13 0500 12/01/13 0544  NA 137  --   136* 136*  K 5.0  --  3.8 3.3*  CL 100  --  96 96  CO2 21  --  24 25  BUN 24*  --  23 26*  CREATININE 1.33 1.34 1.42* 1.37*  GLUCOSE 137*  --  124* 151*   Electrolytes  Recent Labs Lab 11/28/13 2320 11/29/13 0323 11/30/13 0500 12/01/13 0544  CALCIUM 8.7 8.8 9.0 8.8  MG 1.7  --   --   --    Sepsis Markers No results found for this basename: LATICACIDVEN, PROCALCITON, O2SATVEN,  in the last 168 hours ABG  Recent Labs Lab 11/28/13 2321 11/29/13 0815  PHART 7.414 7.428  PCO2ART 37.6 38.0  PO2ART 37.0* 41.0*   Liver Enzymes No results found for this basename: AST, ALT, ALKPHOS, BILITOT, ALBUMIN,  in the last 168 hours Cardiac Enzymes  Recent Labs Lab 11/29/13 1403 11/29/13 1500 11/29/13 2003 11/30/13 0203  TROPONINI <0.30  --  <0.30 <0.30  PROBNP  --  2846.0*  --   --    Glucose  Recent Labs Lab 11/30/13 1615 11/30/13 2016 11/30/13 2026 12/01/13 0014 12/01/13 0324 12/01/13 0739  GLUCAP 164* 297* 281* 92 91 155*    Imaging  3/28 CXR: improved pulmonary edema  ASSESSMENT / PLAN:  PULMONARY A: Acute Respiratory Failure > improved Pulmonary Edema secondary  to CHF COPD without evidence of acute exacerbation, no inhalers at home  P:   Supplemental O2, wean as tolerated SpO2 Goal 92%  CARDIOVASCULAR A:  Acute on chronic systolic CHF LVEF 30-35% in 2011; 3/28 Echo > LVEF 20-25% S/p Left ICA stent  Hypotension > presumably due to heart failure, asymptomatic and overall condition improving H/o CAD, PVD Occasional Afib; currently paced rhythm with first degree AVF  P:   Restart home enalapril when BP improves Lasix po x1 today > goal slightly neg to net even Hold antihypertensives and dig Cardiology consult Low sodium, fluid restricted diet  RENAL A:   Fluid volume overload CKD, baseline Cr 1.2-1.4 P:   Monitor UOP and BMET with diuresis  GASTROINTESTINAL A:   No acute issue  P:   PPx not indicated at this time Advance diet  3/28  HEMATOLOGIC A:   On Plavix for stent  P:  Follow CBC  INFECTIOUS A:   No acute issue  P:   Follow WBC and fever curve  ENDOCRINE A:   DM P:   q4 CBG and SSI May need long acting insulin with diet tsh  NEUROLOGIC A:   Acute encephalopathy > improved  P:   Monitor Supportive Care  TODAY'S SUMMARY: wean O2, keep slightly negative to even, cardiology consult, to SDU TRH to assume care 3/30 AM, PCCM to sign off   Yolonda Kida PCCM Pager: [phone] Cell: [phone] If no response, call [phone]    12/01/2013, 9:18 AM

## 2013-12-01 NOTE — Consult Note (Addendum)
CARDIOLOGY CONSULT NOTE   Patient ID: Francisco Brock MRN: [id number] DOB/AGE: Mar 11, 1936 78 y.o.  Admit Date: 11/28/2013  Primary Physician: Terald Sleeper, MD  Primary Cardiologist    ?? [place] in past. ??   Clinical Summary Francisco Brock is a 78 y.o.male. He was admitted from radiology where an arteriogram was being done. He developed pulmonary edema. Since that time he has been improving.  I have tried to review his prior records. He underwent CABG in 1999. He was seen in consultation at this hospital in 2011. However I cannot find any outpatient cardiology followup notes.  He underwent CABG in 1999. He is status post left BKA. There is diabetes and a history of paroxysmal atrial fibrillation. He's had a significant subdural in the past and CVAs. By history there is an ICD in place. At this time I cannot tell where this is being overseen. Echo in 2011 revealed an EF of 30%.   No Known Allergies  Medications Scheduled Medications: . aspirin  325 mg Oral Q breakfast  . atorvastatin  40 mg Oral q1800  . clopidogrel  75 mg Oral Q breakfast  . furosemide  40 mg Oral Daily  . heparin  5,000 Units Subcutaneous 3 times per day  . insulin aspart  0-20 Units Subcutaneous 6 times per day  . ranolazine  500 mg Oral BID     Infusions: . sodium chloride 10 mL/hr at 12/01/13 1400     PRN Medications:  acetaminophen, acetaminophen, bisacodyl, hydrALAZINE, ondansetron (ZOFRAN) IV   Past Medical History  Diagnosis Date  . Coronary artery stenosis   . Subdural hematoma   . 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 Cardiac defibrillator in place   . CHF (congestive heart failure)   . Diabetes mellitus without complication   . Hypertension   . Cardiomyopathy   . Hyperlipemia   . Peripheral vascular disease   . COPD (chronic obstructive pulmonary disease)   . Shortness of breath   . Dysrhythmia     hx VT  . Stroke 1998    no residual  . Automatic  implantable cardioverter-defibrillator in situ   . Pacemaker   . Pneumonia 2012  . Seizures     last one maybe 2012  . History of blood transfusion   . DVT (deep venous thrombosis)   . 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 Leg amputation above knee      right  . Leg amputation below knee      left  . Coronary artery bypass graft  2004  . Cardiac defibrillator placement  2007  . Balloon angioplasty, artery      rt lower leg  . Carotid endarterectomy      bilateral  . Amputation  06/28/2012    Procedure: AMPUTATION DIGIT;  Surgeon: Tami Ribas, MD;  Location: [facility] OR;  Service: Orthopedics;  Laterality: Left;  left  distal index finger  . Eye surgery Bilateral   . Radiology with anesthesia N/A 11/28/2013    Procedure: ANGIO WITH CAROTID STENT ;  Surgeon: Oneal Grout, MD;  Location: [facility] OR;  Service: Radiology;  Laterality: N/A;    History reviewed. No pertinent family history.  Social History Francisco Brock reports that he has quit smoking. His smoking use included Cigars. He does not have any smokeless tobacco  history on file. Francisco Brock reports that he does not drink alcohol.  Review of Systems  Patient denies fever, chills, headache, sweats, rash, change in vision, change in hearing, chest pain, cough, nausea vomiting, urinary symptoms. All other systems are reviewed and are negative.  Physical Examination Blood pressure 112/29, pulse 64, temperature 98.1 F (36.7 C), temperature source Oral, resp. rate 20, height 4\' 9"  (1.448 m), weight 151 lb 7.3 oz (68.7 kg), SpO2 100.00%.  Intake/Output Summary (Last 24 hours) at 12/01/13 1447 Last data filed at 12/01/13 1400  Gross per 24 hour  Intake   1530 ml  Output   1775 ml  Net   -245 ml    Patient is lethargic but stable. He is flat in bed. Neck is difficult to assess. He has diffuse rales on physical exam. Cardiac exam reveals S1 and S2. The abdomen is  soft. There is 1+ peripheral edema. He has one lower extremity  Prior Cardiac Testing/Procedures  Lab Results  Basic Metabolic Panel:  Recent Labs Lab 11/28/13 2320 11/29/13 0323 11/29/13 1430 11/30/13 0500 12/01/13 0544  NA 136* 137  --  136* 136*  K 4.3 5.0  --  3.8 3.3*  CL 99 100  --  96 96  CO2 24 21  --  24 25  GLUCOSE 193* 137*  --  124* 151*  BUN 23 24*  --  23 26*  CREATININE 1.40* 1.33 1.34 1.42* 1.37*  CALCIUM 8.7 8.8  --  9.0 8.8  MG 1.7  --   --   --   --     Liver Function Tests: No results found for this basename: AST, ALT, ALKPHOS, BILITOT, PROT, ALBUMIN,  in the last 168 hours  CBC:  Recent Labs Lab 11/28/13 2320 11/29/13 0622 11/29/13 1430 11/30/13 0500 12/01/13 0544  WBC 11.8* 11.1* 9.5 12.4* 12.2*  NEUTROABS 8.5*  --   --   --  8.7*  HGB 11.3* 10.5* 10.8* 11.2* 11.1*  HCT 33.6* 31.7* 32.2* 34.1* 33.0*  MCV 93.6 94.6 93.9 93.4 92.7  PLT 226 231 221 231 225    Cardiac Enzymes:  Recent Labs Lab 11/29/13 1403 11/29/13 2003 11/30/13 0203  TROPONINI <0.30 <0.30 <0.30    BNP: No components found with this basename: POCBNP,    Radiology: Dg Chest Port 1 View  11/30/2013   CLINICAL DATA:  Short of breath.  EXAM: PORTABLE CHEST - 1 VIEW  COMPARISON:  11/29/2013  FINDINGS: Pulmonary edema has improved with no significant edema remaining. Is minor lung base opacity that is most likely atelectasis.  Cardiac silhouette is mildly enlarged. Changes from cardiac surgery are stable. Right internal jugular central venous line is stable in well positioned as is the left anterior chest wall AICD.  IMPRESSION: Significant improvement, with essentially resolution of pulmonary edema.   Electronically Signed   By: Amie Portlandavid  Ormond M.D.   On: 11/30/2013 14:32   EKG:   I have reviewed old EKGs, preadmission EKG, and current EKG. He has old right bundle branch block. There is no acute change. He is holding sinus rhythm.   Impression and Recommendations      Dyslipidemia   Type II or unspecified type diabetes mellitus with peripheral circulatory disorders, uncontrolled(250.72)   CAD (coronary artery disease)   PVD (peripheral vascular disease)    Carotid stenosis, symptomatic, with infarction     He was having arteriogram when he went into pulmonary edema.    Bradycardia   Hypoxia  Cardiomyopathy, ischemic     The patient has a documented ischemic cardiomyopathy. Ejection fraction in 2011 was 30%. At this point I do not see an echo this admission. This will be ordered. We need to find a more information about who manages this patient.    Acute on chronic systolic CHF (congestive heart failure)     The patient went into pulmonary edema and arteriogram was done. He is diaries seen. However he is still wet. I am changing his oral Lasix to IV Lasix. It appears that his blood pressure will now support further diuresis.    ICD (implantable cardioverter-defibrillator) in place     We will have to find out who is overseeing his ICD.  Hypokalemia   Potassium has been ordered for him today.  Jerral Bonito, MD   12/01/2013, 2:47 PM

## 2013-12-01 NOTE — Progress Notes (Signed)
[facility] FOR AM LAB REPLACEMENT ONLY  The patient does apply for the [facility] Electrolyte Replacment Protocol based on the criteria listed below:   1. Is GFR >/= 40 ml/min? yes  Patient's GFR today is 49 2. Is urine output >/= 0.5 ml/kg/hr for the last 6 hours? yes Patient's UOP is 0.8 ml/kg/hr 3. Is BUN < 60 mg/dL? yes  Patient's BUN today is 26 4. Abnormal electrolyte(s): K/3.3 5. Ordered repletion with: [id number] 6. If a panic level lab has been reported, has the CCM MD in charge been notified? yes.   Physician:  Kirtland BouchardK Zubelebitskiy,MD  Francisco Brock, Francisco Brock 12/01/2013 6:50 AM

## 2013-12-02 ENCOUNTER — Encounter (HOSPITAL_COMMUNITY): Payer: Self-pay | Admitting: Physician Assistant

## 2013-12-02 DIAGNOSIS — I359 Nonrheumatic aortic valve disorder, unspecified: Secondary | ICD-10-CM

## 2013-12-02 LAB — GLUCOSE, CAPILLARY
GLUCOSE-CAPILLARY: 109 mg/dL — AB (ref 70–99)
GLUCOSE-CAPILLARY: 128 mg/dL — AB (ref 70–99)
Glucose-Capillary: 171 mg/dL — ABNORMAL HIGH (ref 70–99)
Glucose-Capillary: 261 mg/dL — ABNORMAL HIGH (ref 70–99)
Glucose-Capillary: 109 mg/dL — ABNORMAL HIGH (ref 70–99)

## 2013-12-02 LAB — CBC WITH DIFFERENTIAL/PLATELET
BASOS PCT: 0 % (ref 0–1)
Basophils Absolute: 0 10*3/uL (ref 0.0–0.1)
EOS ABS: 0.5 10*3/uL (ref 0.0–0.7)
Eosinophils Relative: 4 % (ref 0–5)
HEMATOCRIT: 34.9 % — AB (ref 39.0–52.0)
Hemoglobin: 11.6 g/dL — ABNORMAL LOW (ref 13.0–17.0)
Lymphocytes Relative: 16 % (ref 12–46)
Lymphs Abs: 2 10*3/uL (ref 0.7–4.0)
MCH: 31.3 pg (ref 26.0–34.0)
MCHC: 33.2 g/dL (ref 30.0–36.0)
MCV: 94.1 fL (ref 78.0–100.0)
MONO ABS: 1.5 10*3/uL — AB (ref 0.1–1.0)
MONOS PCT: 12 % (ref 3–12)
Neutro Abs: 8.1 10*3/uL — ABNORMAL HIGH (ref 1.7–7.7)
Neutrophils Relative %: 67 % (ref 43–77)
Platelets: 244 10*3/uL (ref 150–400)
RBC: 3.71 MIL/uL — AB (ref 4.22–5.81)
RDW: 15.4 % (ref 11.5–15.5)
WBC: 12.1 10*3/uL — ABNORMAL HIGH (ref 4.0–10.5)

## 2013-12-02 LAB — BASIC METABOLIC PANEL
BUN: 29 mg/dL — AB (ref 6–23)
CHLORIDE: 97 meq/L (ref 96–112)
CO2: 26 meq/L (ref 19–32)
Calcium: 8.9 mg/dL (ref 8.4–10.5)
Creatinine, Ser: 1.67 mg/dL — ABNORMAL HIGH (ref 0.50–1.35)
GFR calc Af Amer: 44 mL/min — ABNORMAL LOW (ref >90)
GFR calc non Af Amer: 38 mL/min — ABNORMAL LOW (ref >90)
GLUCOSE: 99 mg/dL (ref 70–99)
POTASSIUM: 3.9 meq/L (ref 3.7–5.3)
Sodium: 138 mEq/L (ref 137–147)

## 2013-12-02 LAB — URINALYSIS, ROUTINE W REFLEX MICROSCOPIC
BILIRUBIN URINE: NEGATIVE
Glucose, UA: NEGATIVE mg/dL
Ketones, ur: NEGATIVE mg/dL
Nitrite: POSITIVE — AB
PH: 6 (ref 5.0–8.0)
Protein, ur: 30 mg/dL — AB
Specific Gravity, Urine: 1.013 (ref 1.005–1.030)
UROBILINOGEN UA: 0.2 mg/dL (ref 0.0–1.0)

## 2013-12-02 LAB — URINE MICROSCOPIC-ADD ON

## 2013-12-02 MED ORDER — INSULIN ASPART 100 UNIT/ML ~~LOC~~ SOLN
0.0000 [IU] | Freq: Three times a day (TID) | SUBCUTANEOUS | Status: DC
  Administered 2013-12-02: 11 [IU] via SUBCUTANEOUS
  Administered 2013-12-02 – 2013-12-03 (×2): 3 [IU] via SUBCUTANEOUS
  Administered 2013-12-03 – 2013-12-04 (×3): 4 [IU] via SUBCUTANEOUS
  Administered 2013-12-04: 11 [IU] via SUBCUTANEOUS
  Administered 2013-12-04: 3 [IU] via SUBCUTANEOUS
  Administered 2013-12-05 (×2): 4 [IU] via SUBCUTANEOUS

## 2013-12-02 MED ORDER — MUPIROCIN 2 % EX OINT
1.0000 "application " | TOPICAL_OINTMENT | Freq: Two times a day (BID) | CUTANEOUS | Status: DC
  Administered 2013-12-02 – 2013-12-05 (×6): 1 via NASAL
  Filled 2013-12-02: qty 22

## 2013-12-02 MED ORDER — CHLORHEXIDINE GLUCONATE CLOTH 2 % EX PADS
6.0000 | MEDICATED_PAD | Freq: Every day | CUTANEOUS | Status: DC
  Administered 2013-12-03 – 2013-12-05 (×3): 6 via TOPICAL

## 2013-12-02 NOTE — Clinical Social Work Note (Signed)
Clinical Social Work Department BRIEF PSYCHOSOCIAL ASSESSMENT 12/02/2013  Patient:  Francisco Brock,Francisco Brock     Account Number:  [id number]     Admit date:  11/28/2013  Clinical Social Worker:  Myles Lipps  Date/Time:  12/02/2013 11:00 AM  Referred by:  RN  Date Referred:  12/02/2013 Referred for  SNF Placement   Other Referral:   Interview type:  Patient Other interview type:   Spoke with facility over the phone    PSYCHOSOCIAL DATA Living Status:  [place] Admitted from facility:  [facility] Level of care:  [place] Primary support name:  Francisco Brock  [phone] Primary support relationship to patient:  CHILD, ADULT Degree of support available:   Adequate    CURRENT CONCERNS Current Concerns  Post-Acute Placement   Other Concerns:    SOCIAL WORK ASSESSMENT / PLAN Clinical Social Worker met with patient at bedside to offer support and discuss patient needs at discharge.  Patient states that he was a resident at [facility] prior to admission and would like to return at discharge.  CSW spoke with facility liaison who states that patient is always welcome to return, "he is a long standing resident."  CSW has completed FL2 and placed on patient chart for signature.  Facility requested contact be made when patient is deemed medically stable for return.  Patient requested that no family be contacted at this time.   CSW remains available for support and to facilitate patient discharge needs once medically ready.   Assessment/plan status:  Psychosocial Support/Ongoing Assessment of Needs Other assessment/ plan:   Information/referral to community resources:   No resources needed at this time - patient has no desire to change facilities    PATIENT'S/FAMILY'S RESPONSE TO PLAN OF CARE: Patient alert and oriented x3 laying in bed.  Patient seems to have adequate family support but definitely facility support.  Patient has been a long term  resident at [facility] and has developed significant relationships that he looks forward to returning to.  Patient minimally engaged in conversation but was very clear on his wishes at discharge.  Patient seems to understand hospital CSW role due to previous admission.  Patient appreciative of CSW working to get him back to [facility].

## 2013-12-02 NOTE — Progress Notes (Signed)
  Echocardiogram 2D Echocardiogram has been performed.  Ellyssa Zagal 12/02/2013, 9:28 AM

## 2013-12-02 NOTE — Progress Notes (Signed)
[place] TEAM 1 - Stepdown/ICU TEAM Progress Note  Francisco LairBenigno Brock WUJ:[id number] DOB: 1936/03/01 DOA: 11/28/2013 PCP: Terald SleeperOBSON,MICHAEL GAVIN, MD  Admit HPI / Brief Narrative: 78 year old male presented 3/26 for cerebral arteriogram and L ICA stent. Post procedure he developed pulmonary edema and complained of SOB. [facility] Then became hypotensive and PCCM asked to see.   SIGNIFICANT EVENTS / STUDIES:  3/26 Cerebral Arteriogram - several arteries with stenosis, L ICA stent placed 3/26 - admit to inpatient from IR  RIJ CVL 3/27 >>>  L fem A Line 3/27 >>> 3/30 - TTE - EF 15-20%  HPI/Subjective: Pt is alert and conversant, though he seems mildly confused.  He denies any complaints whatsoever.    Assessment/Plan:  Hypotension presumably due to heart failure - asymptomatic and overall condition improving - holding diuretic and BP meds   Acute Respiratory Failure Resolved  Pulmonary Edema secondary to CHF - resolved on f/u CXR 3/28  Acute encephalopathy Improving per PCCM report - follow trend - may need further eval if does not improve further over next 24hrs - will check UA  S/p Left ICA stent Care as per IR - on plavix + asa  COPD without evidence of acute exacerbation - no inhalers at home   Acute on chronic systolic ischemic CHF LVEF 30-35% in 2011; 3/28 Echo > LVEF 20-25% - Cardiology is attending to this issue   CKD baseline Cr 1.2-1.4 - crt currently climbing - likely prerenal v/s ATN due to hypotension - hold diuretic - follow trend   DM reasonably controlled - follow trend w/o change today   Dyslipidemia Cont medical tx   Hypokalemia Replaced  H/o CAD, PVD  CABG in 1999 - status post left BKA & right AKA   Occasional Afib currently paced rhythm with first degree AVF  Hx of AICD See complex hx per Cardiology note  H/o L subdural hemorrhage 2011  MRSA screen +  Code Status: FULL Family Communication: no family present at time  of exam Disposition Plan: stable for transfer to tele bed - PT/OT evals (unclear if pt uses prosthesis or wheelchair at home)  Consultants: PCCM > [facility]  Antibiotics: none  DVT prophylaxis: SQ heparin   Objective: Blood pressure 119/22, pulse 73, temperature 97 F (36.1 C), temperature source Axillary, resp. rate 19, height 4\' 9"  (1.448 m), weight 68.7 kg (151 lb 7.3 oz), SpO2 100.00%.  Intake/Output Summary (Last 24 hours) at 12/02/13 1105 Last data filed at 12/02/13 0800  Gross per 24 hour  Intake    320 ml  Output   2740 ml  Net  -2420 ml   Exam: General: No acute respiratory distress Lungs: Clear to auscultation bilaterally without wheezes or crackles Cardiovascular: Regular rate and rhythm without murmur gallop or rub  Abdomen: Nontender, nondistended, soft, bowel sounds positive, no rebound, no ascites, no appreciable mass Extremities: No significant cyanosis, clubbing, or edema bilateral lower extremity stumps  Data Reviewed: Basic Metabolic Panel:  Recent Labs Lab 11/28/13 2320 11/29/13 0323 11/29/13 1430 11/30/13 0500 12/01/13 0544 12/02/13 0243  NA 136* 137  --  136* 136* 138  K 4.3 5.0  --  3.8 3.3* 3.9  CL 99 100  --  96 96 97  CO2 24 21  --  24 25 26   GLUCOSE 193* 137*  --  124* 151* 99  BUN 23 24*  --  23 26* 29*  CREATININE 1.40* 1.33 1.34 1.42* 1.37* 1.67*  CALCIUM  8.7 8.8  --  9.0 8.8 8.9  MG 1.7  --   --   --   --   --    Liver Function Tests: No results found for this basename: AST, ALT, ALKPHOS, BILITOT, PROT, ALBUMIN,  in the last 168 hours  CBC:  Recent Labs Lab 11/28/13 2320 11/29/13 0622 11/29/13 1430 11/30/13 0500 12/01/13 0544 12/02/13 0243  WBC 11.8* 11.1* 9.5 12.4* 12.2* 12.1*  NEUTROABS 8.5*  --   --   --  8.7* 8.1*  HGB 11.3* 10.5* 10.8* 11.2* 11.1* 11.6*  HCT 33.6* 31.7* 32.2* 34.1* 33.0* 34.9*  MCV 93.6 94.6 93.9 93.4 92.7 94.1  PLT 226 231 221 231 225 244   Cardiac Enzymes:  Recent Labs Lab  11/29/13 1403 11/29/13 2003 11/30/13 0203  TROPONINI <0.30 <0.30 <0.30   BNP (last 3 results)  Recent Labs  11/29/13 1500  PROBNP 2846.0*   CBG:  Recent Labs Lab 12/01/13 1608 12/01/13 1931 12/01/13 2340 12/02/13 0316 12/02/13 0749  GLUCAP 213* 201* 125* 109* 171*    Recent Results (from the past 240 hour(s))  MRSA PCR SCREENING     Status: Abnormal   Collection Time    11/28/13  2:22 PM      Result Value Ref Range Status   MRSA by PCR POSITIVE (*) NEGATIVE Final   Comment:            The GeneXpert MRSA Assay (FDA     approved for NASAL specimens     only), is one component of a     comprehensive MRSA colonization     surveillance program. It is not     intended to diagnose MRSA     infection nor to guide or     monitor treatment for     MRSA infections.     RESULT CALLED TO, READ BACK BY AND VERIFIED WITH:     A. [facility] RN 16:35 11/28/13 ([email or web address])    Studies:  Recent x-ray studies have been reviewed in detail by the Attending Physician  Scheduled Meds:  Scheduled Meds: . aspirin  325 mg Oral Q breakfast  . atorvastatin  40 mg Oral q1800  . clopidogrel  75 mg Oral Q breakfast  . heparin  5,000 Units Subcutaneous 3 times per day  . insulin aspart  0-20 Units Subcutaneous 6 times per day  . ranolazine  500 mg Oral BID    Time spent on care of this patient: 35 mins   [facility]  [facility] Office  [phone] Pager - Text Page per Loretha Stapler as per below:  On-Call/Text Page:      [email or web address]      password [email or web address]  If 7PM-7AM, please contact night-coverage [email or web address] Password [email or web address] 12/02/2013, 11:05 AM   LOS: 4 days

## 2013-12-02 NOTE — Progress Notes (Signed)
Patient: Francisco Brock / Admit Date: 11/28/2013 / Date of Encounter: 12/02/2013, 9:43 AM  Subjective  No CP, SOB, feeling better. Affirms that he is followed by [facility].  Objective   Telemetry: NSR occ PAC, PVC  Physical Exam: Blood pressure 119/22, pulse 73, temperature 97 F (36.1 C), temperature source Axillary, resp. rate 19, height 4\' 9"  (1.448 m), weight 151 lb 7.3 oz (68.7 kg), SpO2 100.00%. General: Well developed, well nourished, in no acute distress. Laying about 25 degrees in bed comfortably. Head: Normocephalic, atraumatic, sclera non-icteric, no xanthomas, nares are without discharge. Neck: JVP not elevated. Lungs: Clear bilaterally to auscultation without wheezes, rales, or rhonchi. Breathing is unlabored. Heart: RRR S1 S2 without murmurs, rubs, or gallops.  Abdomen: Soft, non-tender, non-distended with normoactive bowel sounds. No rebound/guarding. Extremities: No clubbing or cyanosis.s/p bilateral LE amputations Neuro: Alert and oriented X 3. Follows commands appropriately.   Intake/Output Summary (Last 24 hours) at 12/02/13 0943 Last data filed at 12/02/13 0800  Gross per 24 hour  Intake    360 ml  Output   2740 ml  Net  -2380 ml    Inpatient Medications:  . aspirin  325 mg Oral Q breakfast  . atorvastatin  40 mg Oral q1800  . clopidogrel  75 mg Oral Q breakfast  . furosemide  80 mg Intravenous Daily  . heparin  5,000 Units Subcutaneous 3 times per day  . insulin aspart  0-20 Units Subcutaneous 6 times per day  . ranolazine  500 mg Oral BID   Infusions:  . sodium chloride 10 mL/hr at 12/01/13 1800    Labs:  Recent Labs  12/01/13 0544 12/02/13 0243  NA 136* 138  K 3.3* 3.9  CL 96 97  CO2 25 26  GLUCOSE 151* 99  BUN 26* 29*  CREATININE 1.37* 1.67*  CALCIUM 8.8 8.9    Recent Labs  12/01/13 0544 12/02/13 0243  WBC 12.2* 12.1*  NEUTROABS 8.7* 8.1*  HGB 11.1* 11.6*  HCT 33.0* 34.9*  MCV 92.7 94.1  PLT 225 244    Recent Labs  11/29/13 1403 11/29/13 2003 11/30/13 0203  TROPONINI <0.30 <0.30 <0.30    Radiology/Studies:  Dg Chest 2 View  11/22/2013   CLINICAL DATA:  Hypertension  EXAM: CHEST  2 VIEW  COMPARISON:  06/28/2012  FINDINGS: Cardiac shadow is enlarged but stable. Postsurgical changes are again seen. A defibrillator is again noted. Mild vascular congestion is seen. No focal infiltrate is noted.  IMPRESSION: Mild vascular congestion new from the prior exam.   Electronically Signed   By: Alcide Clever M.D.   On: 11/22/2013 15:53   Dg Chest Port 1 View  11/30/2013   CLINICAL DATA:  Short of breath.  EXAM: PORTABLE CHEST - 1 VIEW  COMPARISON:  11/29/2013  FINDINGS: Pulmonary edema has improved with no significant edema remaining. Is minor lung base opacity that is most likely atelectasis.  Cardiac silhouette is mildly enlarged. Changes from cardiac surgery are stable. Right internal jugular central venous line is stable in well positioned as is the left anterior chest wall AICD.  IMPRESSION: Significant improvement, with essentially resolution of pulmonary edema.   Electronically Signed   By: Amie Portland M.D.   On: 11/30/2013 14:32   Dg Chest Port 1 View  11/29/2013   CLINICAL DATA:  Central line placement.  EXAM: PORTABLE CHEST - 1 VIEW  COMPARISON:  DG CHEST 1V PORT dated 11/29/2013 at 8:15 a.m.  FINDINGS: Interval placement right internal jugular central venous  catheter with distal tip projecting at cavoatrial junction or proximal right atrium. No pneumothorax.  The cardiac silhouette appears at least moderately enlarged, status post median sternotomy for apparent coronary artery bypass grafting with multiple fractured sternotomy wires. Diffuse interstitial prominence is similar with retrocardiac consolidation and small left pleural effusion. Dual lead Left cardiac defibrillator in situ.  Multiple EKG lines overlie the patient and may obscure subtle underlying pathology. Patient is osteopenic. Soft tissue planes are  nonsuspicious ; dense vascular calcifications within the included to right upper extremity.  IMPRESSION: New right internal jugular central venous catheter with distal tip projecting at cavoatrial junction/proximal right atrium. No pneumothorax.  Stable cardiomegaly and in at least moderate pulmonary edema, retrocardiac consolidation could reflect confluent edema though is nonspecific. Small stable left pleural effusion.   Electronically Signed   By: Awilda Metroourtnay  Bloomer   On: 11/29/2013 13:13   Dg Chest Port 1 View  11/29/2013   CLINICAL DATA:  Decreased saturations, history CHF, diabetes, hypertension, cardiomyopathy, COPD  EXAM: PORTABLE CHEST - 1 VIEW  COMPARISON:  Portable exam 0815 hr compared to 11/29/2013 at 0136 hr  FINDINGS: Left subclavian AICD leads project over right atrium and right ventricle.  Enlargement of cardiac silhouette with pulmonary vascular congestion post median sternotomy.  Sternotomy wires are fractured.  Atherosclerotic calcification aorta.  Perihilar infiltrates most consistent with pulmonary edema.  No gross pleural effusion or pneumothorax.  Bones demineralized.  IMPRESSION: CHF, with pulmonary edema increased since previous study.   Electronically Signed   By: Ulyses SouthwardMark  Boles M.D.   On: 11/29/2013 08:32   Dg Chest Port 1 View  11/29/2013   CLINICAL DATA:  Evaluate infiltrates.  EXAM: PORTABLE CHEST - 1 VIEW  COMPARISON:  Chest x-ray 11/28/2013.  FINDINGS: Previously noted mild pulmonary edema has resolved. There continues to be some pulmonary venous congestion. Bibasilar opacities (left greater than right) are favored to predominantly reflect underlying subsegmental atelectasis, although airspace consolidation, particularly in the left lower lobe, is difficult to exclude. Small left pleural effusion. Heart size is mildly enlarged. The patient is rotated to the left on today's exam, resulting in distortion of the mediastinal contours and reduced diagnostic sensitivity and specificity  for mediastinal pathology. Atherosclerosis in the thoracic aorta. Left-sided pacemaker/AICD with lead tips projecting over the expected location of the right atrium and right ventricular apex. Status post median sternotomy.  IMPRESSION: 1. Compared with yesterday's examination, the previously noted pulmonary edema has resolved. The radiographic appearance the chest is otherwise essentially unchanged, as above.   Electronically Signed   By: Trudie Reedaniel  Entrikin M.D.   On: 11/29/2013 02:26   Dg Chest Port 1v Same Day  11/28/2013   CLINICAL DATA:  Shortness of breath.  EXAM: PORTABLE CHEST - 1 VIEW SAME DAY  COMPARISON:  Chest x-ray 11/22/2013  FINDINGS: Prior CABG. Cardiac pacer noted with lead tips in right atrium and right ventricle. Severe cardiomegaly. Interim appearance of severe pulmonary venous congestion and interstitial prominence with left-sided pleural effusion. These findings are consistent with congestive heart failure and pulmonary edema. No pneumothorax. No acute osseus abnormality.  IMPRESSION: Severe congestive heart failure with pulmonary edema and left-sided pleural effusion .   Electronically Signed   By: Maisie Fushomas  Register   On: 11/28/2013 16:23     Assessment and Plan  1. PVD s/p L ICA stent assisted angioplasty 11/28/13, prior h/o BCEA 2. Acute respiratory failure due to #3 3. Acute on chronic systolic CHF with pulmonary edema post-procedure / history of ischemic cardiomyopathy (  EF 30% in 2011, 25% by echo 11/30/13) 3. Hypotension, improved 4.  H/o ? ICD - pre-op anesthesia notes from 11/2013 say "ventricular tachycardia s/p [employer] ICD '07 (nearing time for elective replacement; however as of 08/12/13 cardiology discussed end of life wishes and "he again expressed that he would rather have a lethal arrhythmia and pass away than to be shocked by a defibrillator or at least not have the surgery to have such an option. We discussed deactivating the ventricular defibrillation therapy and the  current device; however, he wishes to leave everything like it is.")". It appears he had been followed by [facility] cardiology. 5. CAD s/p CABG 1998, ruled out for MI this admission 6. Remote CVA 7. H/o L subdural hemorrhage 2011 8. Reported h/o PAF 9. Anemia 10. 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 BP has been an issue this admission so his Coreg, Inspra, Enalapril have been disontinued but will follow and plan to add back when this stabilizes. Diastolic remains quite low He is not on any home lasix. With rise in Cr today (1.2 on adm -> 1.67 today), will hold off on further Lasix and follow clinically. Lungs are clear. Denies anginal sx. Will ask [employer] to interrogate ICD and request records from [facility] cardiology.   Signed, Ronie Spies PA-C Patient seen and examined. I agree with the assessment and plan as detailed above. See also my additional thoughts below.   We will hold diuretics today. It does appear that he will need to be on a diuretic at home. His volume status was probably high when he came to the hospital, and this led to his acute worsening.  Willa Rough, MD, [facility] 12/02/2013 10:12 AM

## 2013-12-03 LAB — BASIC METABOLIC PANEL
BUN: 23 mg/dL (ref 6–23)
CO2: 26 mEq/L (ref 19–32)
Calcium: 9.1 mg/dL (ref 8.4–10.5)
Chloride: 96 mEq/L (ref 96–112)
Creatinine, Ser: 1.35 mg/dL (ref 0.50–1.35)
GFR calc non Af Amer: 49 mL/min — ABNORMAL LOW (ref >90)
GFR, EST AFRICAN AMERICAN: 57 mL/min — AB (ref >90)
Glucose, Bld: 144 mg/dL — ABNORMAL HIGH (ref 70–99)
POTASSIUM: 4 meq/L (ref 3.7–5.3)
SODIUM: 136 meq/L — AB (ref 137–147)

## 2013-12-03 LAB — GLUCOSE, CAPILLARY
GLUCOSE-CAPILLARY: 169 mg/dL — AB (ref 70–99)
Glucose-Capillary: 162 mg/dL — ABNORMAL HIGH (ref 70–99)
Glucose-Capillary: 188 mg/dL — ABNORMAL HIGH (ref 70–99)
Glucose-Capillary: 142 mg/dL — ABNORMAL HIGH (ref 70–99)

## 2013-12-03 LAB — CBC
HEMATOCRIT: 33.4 % — AB (ref 39.0–52.0)
HEMOGLOBIN: 11.2 g/dL — AB (ref 13.0–17.0)
MCH: 31.1 pg (ref 26.0–34.0)
MCHC: 33.5 g/dL (ref 30.0–36.0)
MCV: 92.8 fL (ref 78.0–100.0)
Platelets: 247 10*3/uL (ref 150–400)
RBC: 3.6 MIL/uL — ABNORMAL LOW (ref 4.22–5.81)
RDW: 15 % (ref 11.5–15.5)
WBC: 11.5 10*3/uL — ABNORMAL HIGH (ref 4.0–10.5)

## 2013-12-03 LAB — HEPATIC FUNCTION PANEL
ALK PHOS: 61 U/L (ref 39–117)
ALT: 7 U/L (ref 0–53)
AST: 14 U/L (ref 0–37)
Albumin: 3 g/dL — ABNORMAL LOW (ref 3.5–5.2)
BILIRUBIN TOTAL: 0.5 mg/dL (ref 0.3–1.2)
Total Protein: 7.6 g/dL (ref 6.0–8.3)

## 2013-12-03 MED ORDER — CARVEDILOL 3.125 MG PO TABS
3.1250 mg | ORAL_TABLET | Freq: Two times a day (BID) | ORAL | Status: DC
  Administered 2013-12-03 – 2013-12-05 (×4): 3.125 mg via ORAL
  Filled 2013-12-03 (×7): qty 1

## 2013-12-03 MED ORDER — FUROSEMIDE 20 MG PO TABS
20.0000 mg | ORAL_TABLET | Freq: Every day | ORAL | Status: DC
  Administered 2013-12-03 – 2013-12-05 (×3): 20 mg via ORAL
  Filled 2013-12-03 (×3): qty 1

## 2013-12-03 MED ORDER — CIPROFLOXACIN IN D5W 400 MG/200ML IV SOLN
400.0000 mg | Freq: Two times a day (BID) | INTRAVENOUS | Status: DC
  Administered 2013-12-03 – 2013-12-04 (×2): 400 mg via INTRAVENOUS
  Filled 2013-12-03 (×3): qty 200

## 2013-12-03 MED ORDER — ENALAPRIL MALEATE 2.5 MG PO TABS
2.5000 mg | ORAL_TABLET | Freq: Every day | ORAL | Status: DC
  Administered 2013-12-03 – 2013-12-05 (×3): 2.5 mg via ORAL
  Filled 2013-12-03 (×3): qty 1

## 2013-12-03 NOTE — Progress Notes (Signed)
[place] TEAM 1 - Stepdown/ICU TEAM Progress Note  Francisco Brock WUJ:[id number] DOB: [birth year] DOA: 11/28/2013 PCP: Terald Sleeper, MD  Admit HPI / Brief Narrative: 78 year old male presented 3/26 for cerebral arteriogram and L ICA stent. Post procedure he developed pulmonary edema and complained of SOB. He was therefore admitted to [facility]. Shortly thereafter he became hypotensive and PCCM was asked to see him.   SIGNIFICANT EVENTS / STUDIES:  3/26 Cerebral Arteriogram - several arteries with stenosis, L ICA stent placed 3/26 - admit to inpatient from IR  RIJ CVL 3/27 >>>  L fem A Line 3/27 >>> 3/30 - TTE - EF 15-20%  HPI/Subjective: Much more alert today, though mental status not likely completely normalized at this time.  Denies cp, n/v, or abdom pain.     Assessment/Plan:  Hypotension ? due to heart failure - BP has stabilized - holding diuretic and BP meds - BP improved today   Acute Respiratory Failure Resolved - wean to room air   Pulmonary Edema secondary to CHF - resolved on f/u CXR 3/28 - Cards is resuming low dose diuretic today   Acute encephalopathy Improving slowly - likely complicated by UTI - follow trend w/ addition of abx  UTI / pyelonephritis / possible prostatitis - Pseudomonas UA markedly abnormal - culture already revealing Pseudomonas - begin empiric Cipro and follow sensitivity data   S/p Left ICA stent Care as per IR - on Plavix + asa  COPD without evidence of acute exacerbation - no inhalers at home   Acute on chronic systolic ischemic CHF LVEF 30-35% in 2011; 3/28 Echo > LVEF 20-25% - Cardiology is attending to this issue - diuretic being resumed today along w/ other agents   CKD baseline Cr 1.2-1.4 - crt has returned to his baseline today w/ holding of diuretic - likely prerenal v/s ATN due to hypotension - diuresis at discretion of Cardiology  DM reasonably controlled - follow trend w/o change today   Dyslipidemia Cont medical tx    Hypokalemia Replaced - stable   H/o CAD, PVD  CABG in 1999 - status post left BKA & right AKA   Occasional Afib currently paced rhythm with first degree AVF  Hx of AICD See complex hx per Cardiology note  H/o L subdural hemorrhage 2011  MRSA screen +  Code Status: FULL Family Communication: no family present at time of exam Disposition Plan: PT/OT evals (unclear if pt uses prosthesis or wheelchair at home) - hopeful for return to SNF in next 24-48hrs   Consultants: PCCM > [facility] Cardiology  Antibiotics: Cipro 3/31 >>  DVT prophylaxis: SQ heparin   Objective: Blood pressure 125/46, pulse 69, temperature 98.8 F (37.1 C), temperature source Oral, resp. rate 18, height 4\' 9"  (1.448 m), weight 65.3 kg (143 lb 15.4 oz), SpO2 100.00%.  Intake/Output Summary (Last 24 hours) at 12/03/13 1133 Last data filed at 12/03/13 0800  Gross per 24 hour  Intake    422 ml  Output    400 ml  Net     22 ml   Exam: General: No acute respiratory distress Lungs: Clear to auscultation bilaterally without wheezes or crackles Cardiovascular: Regular rate without murmur gallop or rub  Abdomen: Nontender, nondistended, soft, bowel sounds positive, no rebound, no ascites, no appreciable mass Extremities: No significant cyanosis, clubbing, or edema bilateral lower extremity stumps  Data Reviewed: Basic Metabolic Panel:  Recent Labs Lab 11/28/13 2320 11/29/13 0323 11/29/13 1430 11/30/13 0500 12/01/13 0544 12/02/13 0243 12/03/13  0252  NA 136* 137  --  136* 136* 138 136*  K 4.3 5.0  --  3.8 3.3* 3.9 4.0  CL 99 100  --  96 96 97 96  CO2 24 21  --  24 25 26 26   GLUCOSE 193* 137*  --  124* 151* 99 144*  BUN 23 24*  --  23 26* 29* 23  CREATININE 1.40* 1.33 1.34 1.42* 1.37* 1.67* 1.35  CALCIUM 8.7 8.8  --  9.0 8.8 8.9 9.1  MG 1.7  --   --   --   --   --   --    Liver Function Tests:  Recent Labs Lab 12/03/13 0252  AST 14  ALT 7  ALKPHOS 61  BILITOT 0.5  PROT 7.6  ALBUMIN 3.0*     CBC:  Recent Labs Lab 11/28/13 2320  11/29/13 1430 11/30/13 0500 12/01/13 0544 12/02/13 0243 12/03/13 0252  WBC 11.8*  < > 9.5 12.4* 12.2* 12.1* 11.5*  NEUTROABS 8.5*  --   --   --  8.7* 8.1*  --   HGB 11.3*  < > 10.8* 11.2* 11.1* 11.6* 11.2*  HCT 33.6*  < > 32.2* 34.1* 33.0* 34.9* 33.4*  MCV 93.6  < > 93.9 93.4 92.7 94.1 92.8  PLT 226  < > 221 231 225 244 247  < > = values in this interval not displayed.  Cardiac Enzymes:  Recent Labs Lab 11/29/13 1403 11/29/13 2003 11/30/13 0203  TROPONINI <0.30 <0.30 <0.30   BNP (last 3 results)  Recent Labs  11/29/13 1500  PROBNP 2846.0*   CBG:  Recent Labs Lab 12/02/13 0749 12/02/13 1146 12/02/13 1714 12/02/13 2212 12/03/13 0803  GLUCAP 171* 261* 128* 109* 162*    Recent Results (from the past 240 hour(s))  MRSA PCR SCREENING     Status: Abnormal   Collection Time    11/28/13  2:22 PM      Result Value Ref Range Status   MRSA by PCR POSITIVE (*) NEGATIVE Final   Comment:            The GeneXpert MRSA Assay (FDA     approved for NASAL specimens     only), is one component of a     comprehensive MRSA colonization     surveillance program. It is not     intended to diagnose MRSA     infection nor to guide or     monitor treatment for     MRSA infections.     RESULT CALLED TO, READ BACK BY AND VERIFIED WITH:     A. [facility] RN 16:35 11/28/13 ([email or web address])  URINE CULTURE     Status: None   Collection Time    12/02/13 12:17 PM      Result Value Ref Range Status   Specimen Description URINE, CLEAN CATCH   Final   Special Requests NONE   Final   Culture  Setup Time     Final   Value: 12/02/2013 17:43     Performed at [facility] Count     Final   Value: >=100,000 COLONIES/ML     Performed at [facility]   Culture     Final   Value: PSEUDOMONAS AERUGINOSA     Performed at [facility]   Report Status PENDING   Incomplete    Studies:  Recent x-ray studies have been reviewed  in detail by the Attending Physician  Scheduled Meds:  Scheduled Meds: . aspirin  325 mg Oral Q breakfast  . atorvastatin  40 mg Oral q1800  . Chlorhexidine Gluconate Cloth  6 each Topical Q0600  . clopidogrel  75 mg Oral Q breakfast  . heparin  5,000 Units Subcutaneous 3 times per day  . insulin aspart  0-20 Units Subcutaneous TID WC  . mupirocin ointment  1 application Nasal BID  . ranolazine  500 mg Oral BID    Time spent on care of this patient: 35 mins   [facility] SMCCLUNG,JEFFREY T  [facility] Office  [phone] Pager - Text Page per Loretha StaplerAmion as per below:  On-Call/Text Page:      [email or web address]      password [email or web address]  If 7PM-7AM, please contact night-coverage [email or web address] Password [email or web address] 12/03/2013, 11:33 AM   LOS: 5 days

## 2013-12-03 NOTE — Care Management Note (Signed)
    Page 1 of 1   12/03/2013     3:35:54 PM   CARE MANAGEMENT NOTE 12/03/2013  Patient:  Francisco Brock,Francisco Brock   Account Number:  [id number]  Date Initiated:  12/03/2013  Documentation initiated by:  GRAVES-BIGELOW,Elica Almas  Subjective/Objective Assessment:   Pt admitted for for cerebral arteriogram and L ICA stent. Post procedure pt complained of SOB then became hypotensive.  Pt much more alert today and was transferred to tele floor.     Action/Plan:   CM will continue to monitor. Pt is from SNF and CS[facility]   Anticipated DC Date:  12/05/2013   Anticipated DC Plan:  SKILLED NURSING FACILITY  In-house referral  Clinical Social Worker      DC Planning Services  CM consult      Choice offered to / List presented to:             Status of service:  Completed, signed off Medicare Important Message given?   (If response is "NO", the following Medicare IM given date fields will be blank) Date Medicare IM given:   Date Additional Medicare IM given:    Discharge Disposition:  SKILLED NURSING FACILITY  Per UR Regulation:  Reviewed for med. necessity/level of care/duration of stay  If discussed at Long Length of Stay Meetings, dates discussed:    Comments:

## 2013-12-03 NOTE — Progress Notes (Signed)
Patient: Francisco Brock / Admit Date: 11/28/2013 / Date of Encounter: 12/03/2013, 11:31 AM  Subjective    Objective   Telemetry:  Patient is sitting up in bed and looking a little stronger today.  Physical Exam: Blood pressure 125/46, pulse 69, temperature 98.8 F (37.1 C), temperature source Oral, resp. rate 18, height 4\' 9"  (1.448 m), weight 143 lb 15.4 oz (65.3 kg), SpO2 100.00%. General: Well developed, well nourished, in no acute distress. Head: Normocephalic, atraumatic, sclera non-icteric, no xanthomas, nares are without discharge. Neck: Negative for carotid bruits. JVP not elevated. Lungs: Clear bilaterally to auscultation without wheezes, rales, or rhonchi. Breathing is unlabored. Heart: RRR S1 S2 without murmurs, rubs, or gallops.  Abdomen: Soft, non-tender, non-distended with normoactive bowel sounds. No rebound/guarding. Extremities: No clubbing or cyanosis. No edema. Distal pedal pulses are 2+ and equal bilaterally. Neuro: Alert and oriented X 3. Moves all extremities spontaneously. Psych:  Responds to questions appropriately with a normal affect.   Intake/Output Summary (Last 24 hours) at 12/03/13 1131 Last data filed at 12/03/13 0800  Gross per 24 hour  Intake    422 ml  Output    400 ml  Net     22 ml    Inpatient Medications:  . aspirin  325 mg Oral Q breakfast  . atorvastatin  40 mg Oral q1800  . Chlorhexidine Gluconate Cloth  6 each Topical Q0600  . clopidogrel  75 mg Oral Q breakfast  . heparin  5,000 Units Subcutaneous 3 times per day  . insulin aspart  0-20 Units Subcutaneous TID WC  . mupirocin ointment  1 application Nasal BID  . ranolazine  500 mg Oral BID   Infusions:    Labs:  Recent Labs  12/02/13 0243 12/03/13 0252  NA 138 136*  K 3.9 4.0  CL 97 96  CO2 26 26  GLUCOSE 99 144*  BUN 29* 23  CREATININE 1.67* 1.35  CALCIUM 8.9 9.1    Recent Labs  12/03/13 0252  AST 14  ALT 7  ALKPHOS 61  BILITOT 0.5  PROT 7.6  ALBUMIN 3.0*     Recent Labs  12/01/13 0544 12/02/13 0243 12/03/13 0252  WBC 12.2* 12.1* 11.5*  NEUTROABS 8.7* 8.1*  --   HGB 11.1* 11.6* 11.2*  HCT 33.0* 34.9* 33.4*  MCV 92.7 94.1 92.8  PLT 225 244 247   No results found for this basename: CKTOTAL, CKMB, TROPONINI,  in the last 72 hours No components found with this basename: POCBNP,  No results found for this basename: HGBA1C,  in the last 72 hours   Radiology/Studies:  Dg Chest 2 View  11/22/2013   CLINICAL DATA:  Hypertension  EXAM: CHEST  2 VIEW  COMPARISON:  06/28/2012  FINDINGS: Cardiac shadow is enlarged but stable. Postsurgical changes are again seen. A defibrillator is again noted. Mild vascular congestion is seen. No focal infiltrate is noted.  IMPRESSION: Mild vascular congestion new from the prior exam.   Electronically Signed   By: Alcide Clever M.D.   On: 11/22/2013 15:53   Ir Delsa Sale Stent Cerv Carotid W/emb-prot Mod Sed  12/02/2013   CLINICAL DATA:  Patient with history of expressive aphasia and right-sided weakness with resolution of weakness with persistence of speech difficulty. 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 History of diabetes mellitus, hypertension and congestive heart failure.  EXAM: BILATERAL COMMON CAROTID ARTERY, RIGHT VERTEBRAL ARTERY ANGIOGRAMS FOLLOWED BY INTRAVSC STENT CERV CAROTID W/ EMB-PROT  ANESTHESIA/SEDATION: Conscious sedation.  MEDICATIONS: As per general anesthesia service.  CONTRAST:  OMNIPAQUE IOHEXOL 300 MG/ML  SOLN.  PROCEDURE: Following a full explanation of the procedure along with the potential associated complications, an informed witnessed consent was obtained.  The right groin was prepped and draped in the usual sterile fashion. Thereafter using a modified Seldinger technique, transfemoral access into the right common femoral artery was obtained without difficulty. Over a 0.035 inch guidewire, 5 French Pinnacle sheath was  inserted. Through this and also over a 0.035 inch guidewire, 5 French JB1 catheter was advanced to the aortic arch region and selectively positioned in the right common carotid artery, the orifice of the right vertebral artery, and the left common carotid artery.  There were no acute complications. The patient tolerated the procedure well.  COMPLICATIONS: None immediate.  FINDINGS: The right common carotid arteriogram demonstrates mild narrowing of the origin of the right external carotid artery. Its branches are normally opacified.  The right internal carotid artery at the bulb through the cranial skull base opacifies normally.  The petrous, the cavernous and the supraclinoid segments are widely patent.  The right middle cerebral artery demonstrates an approximately 50% segmental stenoses in the M1 segment. The trifurcation branches opacify. Focal areas of caliber irregularity are seen involving tertiary branches consistent with intracranial arteriosclerosis.  The right anterior cerebral artery is seen to opacify into the capillary and the venous phases. Cross filling via the anterior communicating artery of the left anterior cerebral A2 segment, the A1 segment and the left MCA distribution is seen. Unopacified blood is seen in the left middle cerebral artery presumably from the ipsilateral left internal carotid artery.  Opacification of the right posterior communicating artery is seen into its distal distribution.  The right vertebral artery origin demonstrates a severe preocclusive stenosis.  The vessel opacifies normally through the cranial skull base otherwise.  There is normal opacification of the right posterior inferior cerebellar artery in the proximal right vertebrobasilar junction.  There is a focal tight narrowing of the right vertebrobasilar junction.  The opacified portion of the basilar artery, the posterior cerebral arteries, superior cerebellar arteries and the anterior-inferior cerebellar arteries  is grossly normal into the delayed arterial phase.  The left common carotid arteriogram demonstrates the left external carotid artery to be normal at its origin. Its branches are normally opacified.  The left internal carotid artery just distal to the bulb has a severe stenosis of approximately 85% by NASCET criteria.  Distal to this, there is a 50% stenoses also by the NASCET criteria.  The vessel opacifies to the cranial skull base where there is a mild focal area of narrowing in the distal cervical segment.  The petrous, the cavernous segment proximally appears normal.  There is a tight focal approximately 85-90% stenosis of the left internal carotid artery supraclinoid segment.  Flow into the left middle cerebral artery is seen with unopacified blood seen from the contralateral right internal carotid artery via the anterior communicating artery as described.  No visualization of the anterior cerebral artery circulation is seen.  Given the symptomatic focal tight stenosis of the left internal carotid artery proximally and intracranially, it was decided to proceed with stent assisted angioplasty of the proximal left internal carotid artery with distal protection. The left internal carotid artery measured approximately 3.7 mm in the normal segment just distal to the tight focal stenosis, and approximately 6.5 mm just proximal to the bifurcation.  The degree of stenosis was estimated to  be approximately 80%. The length of the segment with tight stenosis was approximately 8 mm.  Given the patient's multiple medical problems, as described above, the patient is felt to be a suitable candidate for endovascular revascularization of this symptomatic tight focal stenosis proximally.  The diagnostic JB1 catheter in the left common carotid artery was exchanged over a 0.035 inch 300 cm Rosen exchange guidewire for a 6 French 80 cm Cook shuttle sheath using biplane roadmap technique and constant fluoroscopic guidance. The  guidewire was removed. Good aspiration was obtained from the hub of the 6 [place] Cook shuttle sheath.  A gentle constant injection demonstrated no evidence of spasms, dissections or of intraluminal filling defects.  At this time, a large 4mm -7 mm distal protection device was then captured into its delivery system after having been cleared of air in its housing using heparinized saline infusion at its filter end, and its delivery end.  The filter was captured into the delivery device and locked into position.  A J-tip was formed at the distal end of the 0.014 inch wire.  In a coaxial manner and with constant heparinized saline infusion using biplane roadmap technique and constant fluoroscopic guidance, the combination of the filter delivery microcatheter with the filter wire was advanced to the distal end of the 6 [place] Cook shuttle sheath.  Using a torque device and roadmap technique, the combination was then advanced without difficulty across tight narrowing at the proximal aspect of the left internal carotid artery. The combination of the filter wire and the filter microcatheter were then advanced to the distal vertical segment. The wire was positioned in the horizontal petrous segment.  The filter was then deployed in the usual fashion by retrieving the key at the hub of the delivery microcatheter for the filter.  The delivery microcatheter was then retrieved and removed whilst maintaining constant positioning of the deployed filter in the distal vertical segment of the left internal carotid artery.  A control arteriogram performed through the 6 [place] Cook shuttle sheath demonstrated positioning of the filter in the distal aspect. There was no impairment of flow into the intracranial circulation.  A 4 mm x 20 mm 0.014 rapid exchange balloon angioplasty catheter which had been purged with heparized saline infusion was then advanced using the rapid exchange system and placed across the tight narrowing at the  proximal aspect of the left internal carotid artery.  This was then slowly expanded to its diameter of 4 mm using a Micro inflation suturing device via microtubing. The balloon was left inflated for approximately 15 seconds. It was then deflated and retrieved proximally. A control arteriogram performed through the 6 [place] Cook shuttle sheath demonstrates significantly improved flow through the angioplastied segment proximally, and also intracranially.  The angioplasty balloon catheter was then removed.  At this time a 6 mm stent was purged with heparinized saline infusion.  Again using the rapid exchange mechanism, the delivery catheter of the stent was then advanced to the distal end of the 6 [place] Cook shuttle sheath using biplane roadmap technique and constant fluoroscopic guidance.  Markers of the stent were then positioned such that there was excellent apposition proximal distally.  This was then deployed in the usual fashion without difficulty. The delivery microcatheter of the stent was then retrieved and removed maintaining the distal filter device in a safe location.  A control arteriogram performed through the 6 [place] Cook shuttle sheath demonstrated excellent flow through the stented segment of the left internal carotid artery  proximally.  The waist segment at the site of the area was then angioplastied using a 5 mm x 20 mm Viatrac 0.014 angioplasty balloon catheter. Again this was advanced in a coaxial manner and with constant heparinized saline infusion using biplane roadmap technique and constant fluoroscopic guidance and a rapid exchange mechanism.  The balloon was then expanded to its normal diameter using microinflation syringe device via microtubing. It was maintained there for approximately 15 seconds.  The balloon was then deflated and retrieved and removed. The distal end of the filter wire was kept in a safe stable position.  A control arteriogram was performed through the 6 [place] Cook  shuttle sheath demonstrate excellent flow through the angioplastied and stented segment of the left internal carotid artery. Excellent flow was now seen into the petrous, the cavernous and the supraclinoid segments. Improved hemodynamic flow of contrast was seen in the left MCA distribution. No change was seen in the 85% to 90% stenosis of the supraclinoid left ICA. Also no flow was seen in the left anterior cerebral artery. A filter capture micro catheter was then advanced by coaxial matter and with constant heparinized saline infusion using biplane roadmap technique and constant fluoroscopic guidance through the stented segment without difficulty. It was then advanced until there was kissing with the proximal marker filter. The wire was then retrieved capturing the filter in the filter capture device. The combination was then gently retrieved and removed under constant fluoroscopic guidance and showing no entanglement with the stent. None was observed.  Control arteriograms were then performed at 15 and 30 minutes after that. These continue to demonstrate excellent flow through the stent and also intracranially.  Clinically the patient remained stable. Neurologically the patient remained unchanged.  The patient's ACT was maintained in the region of approximately 180 seconds. The 6 [place] Cook shuttle sheath was then retrieved into the abdominal aorta and exchanged over a J-tip guidewire for a 6 [place] Pinnacle sheath. This was then successfully removed and replaced with an external closure device.  Patient was then transferred to the neuro ICU for overnight observations neurologically, and to continue IV heparin. Close monitoring of the blood pressure was continued.  IMPRESSION: Status post stent assisted angioplasty of symptomatic severe stenoses of the left internal carotid artery proximally using a distal protection device as described without event.  Unchanged 85 to 90% stenosis of the left internal carotid  artery supraclinoid segment.   Electronically Signed   By: Julieanne Cotton M.D.   On: 12/02/2013 08:58   Dg Chest Port 1 View  11/30/2013   CLINICAL DATA:  Short of breath.  EXAM: PORTABLE CHEST - 1 VIEW  COMPARISON:  11/29/2013  FINDINGS: Pulmonary edema has improved with no significant edema remaining. Is minor lung base opacity that is most likely atelectasis.  Cardiac silhouette is mildly enlarged. Changes from cardiac surgery are stable. Right internal jugular central venous line is stable in well positioned as is the left anterior chest wall AICD.  IMPRESSION: Significant improvement, with essentially resolution of pulmonary edema.   Electronically Signed   By: Amie Portland M.D.   On: 11/30/2013 14:32   Dg Chest Port 1 View  11/29/2013   CLINICAL DATA:  Central line placement.  EXAM: PORTABLE CHEST - 1 VIEW  COMPARISON:  DG CHEST 1V PORT dated 11/29/2013 at 8:15 a.m.  FINDINGS: Interval placement right internal jugular central venous catheter with distal tip projecting at cavoatrial junction or proximal right atrium. No pneumothorax.  The cardiac silhouette  appears at least moderately enlarged, status post median sternotomy for apparent coronary artery bypass grafting with multiple fractured sternotomy wires. Diffuse interstitial prominence is similar with retrocardiac consolidation and small left pleural effusion. Dual lead Left cardiac defibrillator in situ.  Multiple EKG lines overlie the patient and may obscure subtle underlying pathology. Patient is osteopenic. Soft tissue planes are nonsuspicious ; dense vascular calcifications within the included to right upper extremity.  IMPRESSION: New right internal jugular central venous catheter with distal tip projecting at cavoatrial junction/proximal right atrium. No pneumothorax.  Stable cardiomegaly and in at least moderate pulmonary edema, retrocardiac consolidation could reflect confluent edema though is nonspecific. Small stable left pleural  effusion.   Electronically Signed   By: Awilda Metro   On: 11/29/2013 13:13   Dg Chest Port 1 View  11/29/2013   CLINICAL DATA:  Decreased saturations, history CHF, diabetes, hypertension, cardiomyopathy, COPD  EXAM: PORTABLE CHEST - 1 VIEW  COMPARISON:  Portable exam 0815 hr compared to 11/29/2013 at 0136 hr  FINDINGS: Left subclavian AICD leads project over right atrium and right ventricle.  Enlargement of cardiac silhouette with pulmonary vascular congestion post median sternotomy.  Sternotomy wires are fractured.  Atherosclerotic calcification aorta.  Perihilar infiltrates most consistent with pulmonary edema.  No gross pleural effusion or pneumothorax.  Bones demineralized.  IMPRESSION: CHF, with pulmonary edema increased since previous study.   Electronically Signed   By: Ulyses Southward M.D.   On: 11/29/2013 08:32   Dg Chest Port 1 View  11/29/2013   CLINICAL DATA:  Evaluate infiltrates.  EXAM: PORTABLE CHEST - 1 VIEW  COMPARISON:  Chest x-ray 11/28/2013.  FINDINGS: Previously noted mild pulmonary edema has resolved. There continues to be some pulmonary venous congestion. Bibasilar opacities (left greater than right) are favored to predominantly reflect underlying subsegmental atelectasis, although airspace consolidation, particularly in the left lower lobe, is difficult to exclude. Small left pleural effusion. Heart size is mildly enlarged. The patient is rotated to the left on today's exam, resulting in distortion of the mediastinal contours and reduced diagnostic sensitivity and specificity for mediastinal pathology. Atherosclerosis in the thoracic aorta. Left-sided pacemaker/AICD with lead tips projecting over the expected location of the right atrium and right ventricular apex. Status post median sternotomy.  IMPRESSION: 1. Compared with yesterday's examination, the previously noted pulmonary edema has resolved. The radiographic appearance the chest is otherwise essentially unchanged, as above.    Electronically Signed   By: Trudie Reed M.D.   On: 11/29/2013 02:26   Dg Chest Port 1v Same Day  11/28/2013   CLINICAL DATA:  Shortness of breath.  EXAM: PORTABLE CHEST - 1 VIEW SAME DAY  COMPARISON:  Chest x-ray 11/22/2013  FINDINGS: Prior CABG. Cardiac pacer noted with lead tips in right atrium and right ventricle. Severe cardiomegaly. Interim appearance of severe pulmonary venous congestion and interstitial prominence with left-sided pleural effusion. These findings are consistent with congestive heart failure and pulmonary edema. No pneumothorax. No acute osseus abnormality.  IMPRESSION: Severe congestive heart failure with pulmonary edema and left-sided pleural effusion .   Electronically Signed   By: Maisie Fus  Register   On: 11/28/2013 16:23     Assessment and Plan  1. PVD s/p L ICA stent assisted angioplasty 11/28/13, prior h/o BCEA  2. Acute respiratory failure due to #3  3. Acute on chronic systolic CHF with pulmonary edema post-procedure / history of ischemic cardiomyopathy (EF 30% in 2011, 25% by echo 11/30/13)  3. Hypotension, improved  4. H/o VT  s/p MDT ICD - pre-op anesthesia notes from 11/2013 say "ventricular tachycardia s/p [employer] ICD '07 (nearing time for elective replacement; however as of 08/12/13 cardiology discussed end of life wishes and "he again expressed that he would rather have a lethal arrhythmia and pass away than to be shocked by a defibrillator or at least not have the surgery to have such an option. We discussed deactivating the ventricular defibrillation therapy and the current device; however, he wishes to leave everything like it is.")". [facility], will continue current plan as above. 5. CAD s/p CABG 1998, ruled out for MI this admission  6. Remote CVA  7. H/o L subdural hemorrhage 2011  8. Reported h/o PAF  9. Anemia  10. 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 Pseudomonas UTI,  per IM  BP remains on softer side so will need to be careful going forward with med resumption. Cr improved. Will start Lasix 20mg  daily. Will add back low dose Coreg at 3.125mg  BID and Enalapril 2.5mg  daily as BP tolerates. With recent renal issues, would hold off on digoxin or Inspra for now. Consider reduction in aspirin to 81mg  daily if appropriate.   Signed, Ronie Spiesayna Dunn PA-C Patient seen and examined. I agree with the assessment and plan as detailed above. See also my additional thoughts below.   I discussed all of the plans admitted to the decision making on this patient today. We will keep him on a small dose of oral diuretic. We will also try to start some of his medicines for his cardiomyopathy and very low doses.  Willa RoughJeffrey Katz, MD, [facility] 12/03/2013 1:16 PM

## 2013-12-03 NOTE — Progress Notes (Signed)
UR Completed Rhianne Soman Graves-Bigelow, RN,BSN [phone]  

## 2013-12-04 LAB — URINE CULTURE

## 2013-12-04 LAB — GLUCOSE, CAPILLARY
Glucose-Capillary: 164 mg/dL — ABNORMAL HIGH (ref 70–99)
Glucose-Capillary: 259 mg/dL — ABNORMAL HIGH (ref 70–99)
Glucose-Capillary: 147 mg/dL — ABNORMAL HIGH (ref 70–99)
Glucose-Capillary: 297 mg/dL — ABNORMAL HIGH (ref 70–99)

## 2013-12-04 LAB — BASIC METABOLIC PANEL
BUN: 21 mg/dL (ref 6–23)
CHLORIDE: 97 meq/L (ref 96–112)
CO2: 24 meq/L (ref 19–32)
Calcium: 8.9 mg/dL (ref 8.4–10.5)
Creatinine, Ser: 1.38 mg/dL — ABNORMAL HIGH (ref 0.50–1.35)
GFR calc Af Amer: 56 mL/min — ABNORMAL LOW (ref >90)
GFR, EST NON AFRICAN AMERICAN: 48 mL/min — AB (ref >90)
Glucose, Bld: 170 mg/dL — ABNORMAL HIGH (ref 70–99)
Potassium: 3.8 mEq/L (ref 3.7–5.3)
SODIUM: 138 meq/L (ref 137–147)

## 2013-12-04 MED ORDER — CIPROFLOXACIN HCL 500 MG PO TABS
500.0000 mg | ORAL_TABLET | Freq: Two times a day (BID) | ORAL | Status: DC
  Administered 2013-12-04 – 2013-12-05 (×3): 500 mg via ORAL
  Filled 2013-12-04 (×4): qty 1

## 2013-12-04 NOTE — Plan of Care (Signed)
Problem: Phase II Progression Outcomes Goal: Ambulates up to 600 ft. 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

## 2013-12-04 NOTE — Progress Notes (Signed)
[place] TEAM 1 - Stepdown/ICU TEAM Progress Note  Francisco Brock ZOX:[id number] DOB: 15-Oct-1935 DOA: 11/28/2013 PCP: Terald Sleeper, MD  Admit HPI / Brief Narrative: 78 year old male presented 3/26 for cerebral arteriogram and L ICA stent. Post procedure he developed pulmonary edema and complained of SOB. He was therefore admitted to [facility]. Shortly thereafter he became hypotensive and PCCM was asked to see him.   SIGNIFICANT EVENTS / STUDIES:  3/26 Cerebral Arteriogram - several arteries with stenosis, L ICA stent placed 3/26 - admit to inpatient from IR  RIJ CVL 3/27 >>>  L fem A Line 3/27 >>> 3/30 - TTE - EF 15-20%  HPI/Subjective: Much more alert today - likely at his baseline now.  Denies cp, n/v, or abdom pain.     Assessment/Plan:  Hypotension ? due to heart failure - blood pressure has been marginal - diuretics have been resumed along with low-dose beta blocker - currently patient appears to be tolerating this well - monitor 24 hours additional to assure that further drop in blood pressure is not suffered  Acute Respiratory Failure Resolved - weaned to room air   Pulmonary Edema secondary to CHF - resolved on f/u CXR 3/28 - Cards resumed low dose diuretic 3/31 - creatinine has slightly climbed - will recheck creatinine in a.m. to assure has not markedly increased / is stable  Acute encephalopathy likely due to acute illness plus urinary tract infection - appears markedly improved today with treatment of UTI  UTI / pyelonephritis / possible prostatitis - Pseudomonas UA markedly abnormal - culture revealed Pseudomonas sensitive to Cipro - improving clinically   S/p Left ICA stent Care as per IR - on Plavix + asa  COPD without evidence of acute exacerbation - no inhalers at home   Acute on chronic systolic ischemic CHF LVEF 30-35% in 2011; 3/28 Echo > LVEF 20-25% - Cardiology is attending to this issue - diuretic has been resumed - following renal fxn closely - no  evidence of volume overload at this time   CKD baseline Cr 1.2-1.4 - likely prerenal v/s ATN due to hypotension - diuresis at discretion of Cardiology - recheck BMET in AM   DM reasonably controlled - follow trend w/o change today   Dyslipidemia Cont medical tx   Hypokalemia Replaced - stable   H/o CAD, PVD  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 rate 18, height 4\' 9"  (1.448 m), weight 65.091 kg (143 lb 8 oz), SpO2 95.00%.  Intake/Output Summary (Last 24 hours) at 12/04/13 1255 Last data filed at 12/04/13 1000  Gross per 24 hour  Intake    702 ml  Output   1550 ml  Net   -848 ml   Exam: General: No acute respiratory distress at rest  Lungs: Clear to auscultation bilaterally without wheezes or crackles Cardiovascular: Regular rate without murmur gallop or rub  Abdomen: Nontender, nondistended, soft, bowel sounds positive, no rebound,  no ascites, no appreciable mass Extremities: No significant cyanosis, clubbing, or edema bilateral lower extremity stumps  Data Reviewed: Basic Metabolic Panel:  Recent Labs Lab 11/28/13 2320  11/30/13 0500 12/01/13 0544 12/02/13 0243 12/03/13 0252 12/04/13 0525  NA 136*  < > 136* 136* 138 136* 138  K 4.3  < > 3.8 3.3* 3.9 4.0 3.8  CL 99  < > 96 96 97 96 97  CO2 24  < > 24 25 26 26 24   GLUCOSE 193*  < > 124* 151* 99 144* 170*  BUN 23  < > 23 26* 29* 23 21  CREATININE 1.40*  < > 1.42* 1.37* 1.67*  1.35 1.38*  CALCIUM 8.7  < > 9.0 8.8 8.9 9.1 8.9  MG 1.7  --   --   --   --   --   --   < > = values in this interval not displayed.  Liver Function Tests:  Recent Labs Lab 12/03/13 0252  AST 14  ALT 7  ALKPHOS 61  BILITOT 0.5  PROT 7.6  ALBUMIN 3.0*   CBC:  Recent Labs Lab 11/28/13 2320  11/29/13 1430 11/30/13 0500 12/01/13 0544 12/02/13 0243 12/03/13 0252  WBC 11.8*  < > 9.5 12.4* 12.2* 12.1* 11.5*  NEUTROABS 8.5*  --   --   --  8.7* 8.1*  --   HGB 11.3*  < > 10.8* 11.2* 11.1* 11.6* 11.2*  HCT 33.6*  < > 32.2* 34.1* 33.0* 34.9* 33.4*  MCV 93.6  < > 93.9 93.4 92.7 94.1 92.8  PLT 226  < > 221 231 225 244 247  < > = values in this interval not displayed.  Cardiac Enzymes:  Recent Labs Lab 11/29/13 1403 11/29/13 2003 11/30/13 0203  TROPONINI <0.30 <0.30 <0.30   BNP (last 3 results)  Recent Labs  11/29/13 1500  PROBNP 2846.0*   CBG:  Recent Labs Lab 12/03/13 1129 12/03/13 1712 12/03/13 2125 12/04/13 0756 12/04/13 1142  GLUCAP 188* 142* 169* 164* 259*    Recent Results (from the past 240 hour(s))  MRSA PCR SCREENING     Status: Abnormal   Collection Time    11/28/13  2:22 PM      Result Value Ref Range Status   MRSA by PCR POSITIVE (*) NEGATIVE Final   Comment:            The GeneXpert MRSA Assay (FDA     approved for NASAL specimens     only), is one component of a     comprehensive MRSA colonization     surveillance program. It is not     intended to diagnose MRSA     infection nor to guide or     monitor treatment for     MRSA infections.     RESULT CALLED TO, READ BACK BY AND VERIFIED WITH:     A. [facility] RN 16:35 11/28/13 ([email or web address])  URINE CULTURE     Status: None   Collection Time    12/02/13 12:17 PM      Result Value Ref Range Status   Specimen Description URINE, CLEAN CATCH   Final   Special Requests NONE   Final   Culture  Setup Time     Final   Value: 12/02/2013 17:43     Performed at [facility] Count      Final   Value: >=100,000 COLONIES/ML     Performed at [facility]  Culture     Final   Value: PSEUDOMONAS AERUGINOSA     [facility]   Report Status 12/04/2013 FINAL   Final   Organism ID, Bacteria PSEUDOMONAS AERUGINOSA   Final    Studies:  Recent x-ray studies have been reviewed in detail by the Attending Physician  Scheduled Meds:  Scheduled Meds: . aspirin  325 mg Oral Q breakfast  . atorvastatin  40 mg Oral q1800  . carvedilol  3.125 mg Oral BID WC  . Chlorhexidine Gluconate Cloth  6 each Topical Q0600  . ciprofloxacin  500 mg Oral BID  . clopidogrel  75 mg Oral Q breakfast  . enalapril  2.5 mg Oral Daily  . furosemide  20 mg Oral Daily  . heparin  5,000 Units Subcutaneous 3 times per day  . insulin aspart  0-20 Units Subcutaneous TID WC  . mupirocin ointment  1 application Nasal BID  . ranolazine  500 mg Oral BID    Time spent on care of this patient: 25 mins   [facility] S FMCCLUNG,JEFFREY T  [facility] Office  [phone] Pager - Text Page per Loretha StaplerAmion as per below:  On-Call/Text Page:      [email or web address]      password [email or web address]  If 7PM-7AM, please contact night-coverage [email or web address] Password [email or web address] 12/04/2013, 12:55 PM   LOS: 6 days

## 2013-12-04 NOTE — Progress Notes (Signed)
PHARMACIST - PHYSICIAN COMMUNICATION DR:   [facility] CONCERNING: Antibiotic IV to Oral Route Change Policy  RECOMMENDATION: This patient is receiving Cipro by the intravenous route.  Based on criteria approved by the Pharmacy and Therapeutics Committee, the antibiotic(s) is/are being converted to the equivalent oral dose form(s).   DESCRIPTION: These criteria include:  Patient being treated for a respiratory tract infection, urinary tract infection, cellulitis or clostridium difficile associated diarrhea if on metronidazole  The patient is not neutropenic and does not exhibit a GI malabsorption state  The patient is eating (either orally or via tube) and/or has been taking other orally administered medications for a least 24 hours  The patient is improving clinically and has a Tmax < 100.5  If you have questions about this conversion, please contact the Pharmacy Department  []   [phone]( [phone] )  [facility] [x]   [phone]( [phone] )  [facility]  []   [phone]( [phone] )  [facility] []   [phone]( [phone] )  [facility]

## 2013-12-04 NOTE — Progress Notes (Signed)
Patient: Francisco Brock / Admit Date: 11/28/2013 / Date of Encounter: 12/04/2013, 10:25 AM  Subjective  Feeling well. No complaints. Spanish interpreter in room.  Objective   Telemetry: NSR 1st degree AVB occ PVCs  Physical Exam: Blood pressure 98/54, pulse 70, temperature 98 F (36.7 C), temperature source Oral, resp. rate 18, height 4\' 9"  (1.448 m), weight 143 lb 8 oz (65.091 kg), SpO2 92.00%. General: Well developed, well nourished, in no acute distress. Laying flat in bed comfortably.  Head: Normocephalic, atraumatic, sclera non-icteric, no xanthomas, nares are without discharge.  Neck: JVP not elevated.  Lungs: Clear bilaterally to auscultation without wheezes, rales, or rhonchi. Breathing is unlabored.  Heart: RRR S1 S2 without murmurs, rubs, or gallops.  Abdomen: Soft, non-tender, non-distended with normoactive bowel sounds. No rebound/guarding.  Extremities: No clubbing or cyanosis.s/p bilateral LE amputations  Neuro: Alert and oriented X 3. Follows commands appropriately.   Intake/Output Summary (Last 24 hours) at 12/04/13 1025 Last data filed at 12/04/13 0547  Gross per 24 hour  Intake    922 ml  Output   1300 ml  Net   -378 ml    Inpatient Medications:  . aspirin  325 mg Oral Q breakfast  . atorvastatin  40 mg Oral q1800  . carvedilol  3.125 mg Oral BID WC  . Chlorhexidine Gluconate Cloth  6 each Topical Q0600  . ciprofloxacin  500 mg Oral BID  . clopidogrel  75 mg Oral Q breakfast  . enalapril  2.5 mg Oral Daily  . furosemide  20 mg Oral Daily  . heparin  5,000 Units Subcutaneous 3 times per day  . insulin aspart  0-20 Units Subcutaneous TID WC  . mupirocin ointment  1 application Nasal BID  . ranolazine  500 mg Oral BID   Infusions:    Labs:  Recent Labs  12/03/13 0252 12/04/13 0525  NA 136* 138  K 4.0 3.8  CL 96 97  CO2 26 24  GLUCOSE 144* 170*  BUN 23 21  CREATININE 1.35 1.38*  CALCIUM 9.1 8.9    Recent Labs  12/03/13 0252  AST 14  ALT 7    ALKPHOS 61  BILITOT 0.5  PROT 7.6  ALBUMIN 3.0*    Recent Labs  12/02/13 0243 12/03/13 0252  WBC 12.1* 11.5*  NEUTROABS 8.1*  --   HGB 11.6* 11.2*  HCT 34.9* 33.4*  MCV 94.1 92.8  PLT 244 247   No results found for this basename: CKTOTAL, CKMB, TROPONINI,  in the last 72 hours No components found with this basename: POCBNP,  No results found for this basename: HGBA1C,  in the last 72 hours   Radiology/Studies:  Dg Chest 2 View  11/22/2013   CLINICAL DATA:  Hypertension  EXAM: CHEST  2 VIEW  COMPARISON:  06/28/2012  FINDINGS: Cardiac shadow is enlarged but stable. Postsurgical changes are again seen. A defibrillator is again noted. Mild vascular congestion is seen. No focal infiltrate is noted.  IMPRESSION: Mild vascular congestion new from the prior exam.   Electronically Signed   By: Alcide Clever M.D.   On: 11/22/2013 15:53   Ir Delsa Sale Stent Cerv Carotid W/emb-prot Mod Sed  12/02/2013   CLINICAL DATA:  Patient with history of expressive aphasia and right-sided weakness with resolution of weakness with persistence of speech difficulty. 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 History of diabetes mellitus, hypertension and congestive heart failure.  EXAM: BILATERAL COMMON CAROTID ARTERY,  RIGHT VERTEBRAL ARTERY ANGIOGRAMS FOLLOWED BY INTRAVSC STENT CERV CAROTID W/ EMB-PROT  ANESTHESIA/SEDATION: Conscious sedation.  MEDICATIONS: As per general anesthesia service.  CONTRAST:  OMNIPAQUE IOHEXOL 300 MG/ML  SOLN.  PROCEDURE: Following a full explanation of the procedure along with the potential associated complications, an informed witnessed consent was obtained.  The right groin was prepped and draped in the usual sterile fashion. Thereafter using a modified Seldinger technique, transfemoral access into the right common femoral artery was obtained without difficulty. Over a 0.035 inch guidewire, 5 French Pinnacle sheath  was inserted. Through this and also over a 0.035 inch guidewire, 5 French JB1 catheter was advanced to the aortic arch region and selectively positioned in the right common carotid artery, the orifice of the right vertebral artery, and the left common carotid artery.  There were no acute complications. The patient tolerated the procedure well.  COMPLICATIONS: None immediate.  FINDINGS: The right common carotid arteriogram demonstrates mild narrowing of the origin of the right external carotid artery. Its branches are normally opacified.  The right internal carotid artery at the bulb through the cranial skull base opacifies normally.  The petrous, the cavernous and the supraclinoid segments are widely patent.  The right middle cerebral artery demonstrates an approximately 50% segmental stenoses in the M1 segment. The trifurcation branches opacify. Focal areas of caliber irregularity are seen involving tertiary branches consistent with intracranial arteriosclerosis.  The right anterior cerebral artery is seen to opacify into the capillary and the venous phases. Cross filling via the anterior communicating artery of the left anterior cerebral A2 segment, the A1 segment and the left MCA distribution is seen. Unopacified blood is seen in the left middle cerebral artery presumably from the ipsilateral left internal carotid artery.  Opacification of the right posterior communicating artery is seen into its distal distribution.  The right vertebral artery origin demonstrates a severe preocclusive stenosis.  The vessel opacifies normally through the cranial skull base otherwise.  There is normal opacification of the right posterior inferior cerebellar artery in the proximal right vertebrobasilar junction.  There is a focal tight narrowing of the right vertebrobasilar junction.  The opacified portion of the basilar artery, the posterior cerebral arteries, superior cerebellar arteries and the anterior-inferior cerebellar  arteries is grossly normal into the delayed arterial phase.  The left common carotid arteriogram demonstrates the left external carotid artery to be normal at its origin. Its branches are normally opacified.  The left internal carotid artery just distal to the bulb has a severe stenosis of approximately 85% by NASCET criteria.  Distal to this, there is a 50% stenoses also by the NASCET criteria.  The vessel opacifies to the cranial skull base where there is a mild focal area of narrowing in the distal cervical segment.  The petrous, the cavernous segment proximally appears normal.  There is a tight focal approximately 85-90% stenosis of the left internal carotid artery supraclinoid segment.  Flow into the left middle cerebral artery is seen with unopacified blood seen from the contralateral right internal carotid artery via the anterior communicating artery as described.  No visualization of the anterior cerebral artery circulation is seen.  Given the symptomatic focal tight stenosis of the left internal carotid artery proximally and intracranially, it was decided to proceed with stent assisted angioplasty of the proximal left internal carotid artery with distal protection. The left internal carotid artery measured approximately 3.7 mm in the normal segment just distal to the tight focal stenosis, and approximately 6.5 mm  just proximal to the bifurcation.  The degree of stenosis was estimated to be approximately 80%. The length of the segment with tight stenosis was approximately 8 mm.  Given the patient's multiple medical problems, as described above, the patient is felt to be a suitable candidate for endovascular revascularization of this symptomatic tight focal stenosis proximally.  The diagnostic JB1 catheter in the left common carotid artery was exchanged over a 0.035 inch 300 cm Rosen exchange guidewire for a 6 French 80 cm Cook shuttle sheath using biplane roadmap technique and constant fluoroscopic guidance.  The guidewire was removed. Good aspiration was obtained from the hub of the 6 [place] Cook shuttle sheath.  A gentle constant injection demonstrated no evidence of spasms, dissections or of intraluminal filling defects.  At this time, a large 4mm -7 mm distal protection device was then captured into its delivery system after having been cleared of air in its housing using heparinized saline infusion at its filter end, and its delivery end.  The filter was captured into the delivery device and locked into position.  A J-tip was formed at the distal end of the 0.014 inch wire.  In a coaxial manner and with constant heparinized saline infusion using biplane roadmap technique and constant fluoroscopic guidance, the combination of the filter delivery microcatheter with the filter wire was advanced to the distal end of the 6 [place] Cook shuttle sheath.  Using a torque device and roadmap technique, the combination was then advanced without difficulty across tight narrowing at the proximal aspect of the left internal carotid artery. The combination of the filter wire and the filter microcatheter were then advanced to the distal vertical segment. The wire was positioned in the horizontal petrous segment.  The filter was then deployed in the usual fashion by retrieving the key at the hub of the delivery microcatheter for the filter.  The delivery microcatheter was then retrieved and removed whilst maintaining constant positioning of the deployed filter in the distal vertical segment of the left internal carotid artery.  A control arteriogram performed through the 6 [place] Cook shuttle sheath demonstrated positioning of the filter in the distal aspect. There was no impairment of flow into the intracranial circulation.  A 4 mm x 20 mm 0.014 rapid exchange balloon angioplasty catheter which had been purged with heparized saline infusion was then advanced using the rapid exchange system and placed across the tight narrowing at the  proximal aspect of the left internal carotid artery.  This was then slowly expanded to its diameter of 4 mm using a Micro inflation suturing device via microtubing. The balloon was left inflated for approximately 15 seconds. It was then deflated and retrieved proximally. A control arteriogram performed through the 6 [place] Cook shuttle sheath demonstrates significantly improved flow through the angioplastied segment proximally, and also intracranially.  The angioplasty balloon catheter was then removed.  At this time a 6 mm stent was purged with heparinized saline infusion.  Again using the rapid exchange mechanism, the delivery catheter of the stent was then advanced to the distal end of the 6 [place] Cook shuttle sheath using biplane roadmap technique and constant fluoroscopic guidance.  Markers of the stent were then positioned such that there was excellent apposition proximal distally.  This was then deployed in the usual fashion without difficulty. The delivery microcatheter of the stent was then retrieved and removed maintaining the distal filter device in a safe location.  A control arteriogram performed through the 6 [place] Cook shuttle sheath  demonstrated excellent flow through the stented segment of the left internal carotid artery proximally.  The waist segment at the site of the area was then angioplastied using a 5 mm x 20 mm Viatrac 0.014 angioplasty balloon catheter. Again this was advanced in a coaxial manner and with constant heparinized saline infusion using biplane roadmap technique and constant fluoroscopic guidance and a rapid exchange mechanism.  The balloon was then expanded to its normal diameter using microinflation syringe device via microtubing. It was maintained there for approximately 15 seconds.  The balloon was then deflated and retrieved and removed. The distal end of the filter wire was kept in a safe stable position.  A control arteriogram was performed through the 6 [place] Cook  shuttle sheath demonstrate excellent flow through the angioplastied and stented segment of the left internal carotid artery. Excellent flow was now seen into the petrous, the cavernous and the supraclinoid segments. Improved hemodynamic flow of contrast was seen in the left MCA distribution. No change was seen in the 85% to 90% stenosis of the supraclinoid left ICA. Also no flow was seen in the left anterior cerebral artery. A filter capture micro catheter was then advanced by coaxial matter and with constant heparinized saline infusion using biplane roadmap technique and constant fluoroscopic guidance through the stented segment without difficulty. It was then advanced until there was kissing with the proximal marker filter. The wire was then retrieved capturing the filter in the filter capture device. The combination was then gently retrieved and removed under constant fluoroscopic guidance and showing no entanglement with the stent. None was observed.  Control arteriograms were then performed at 15 and 30 minutes after that. These continue to demonstrate excellent flow through the stent and also intracranially.  Clinically the patient remained stable. Neurologically the patient remained unchanged.  The patient's ACT was maintained in the region of approximately 180 seconds. The 6 [place] Cook shuttle sheath was then retrieved into the abdominal aorta and exchanged over a J-tip guidewire for a 6 [place] Pinnacle sheath. This was then successfully removed and replaced with an external closure device.  Patient was then transferred to the neuro ICU for overnight observations neurologically, and to continue IV heparin. Close monitoring of the blood pressure was continued.  IMPRESSION: Status post stent assisted angioplasty of symptomatic severe stenoses of the left internal carotid artery proximally using a distal protection device as described without event.  Unchanged 85 to 90% stenosis of the left internal carotid  artery supraclinoid segment.   Electronically Signed   By: Julieanne Cotton M.D.   On: 12/02/2013 08:58   Dg Chest Port 1 View  11/30/2013   CLINICAL DATA:  Short of breath.  EXAM: PORTABLE CHEST - 1 VIEW  COMPARISON:  11/29/2013  FINDINGS: Pulmonary edema has improved with no significant edema remaining. Is minor lung base opacity that is most likely atelectasis.  Cardiac silhouette is mildly enlarged. Changes from cardiac surgery are stable. Right internal jugular central venous line is stable in well positioned as is the left anterior chest wall AICD.  IMPRESSION: Significant improvement, with essentially resolution of pulmonary edema.   Electronically Signed   By: Amie Portland M.D.   On: 11/30/2013 14:32   Dg Chest Port 1 View  11/29/2013   CLINICAL DATA:  Central line placement.  EXAM: PORTABLE CHEST - 1 VIEW  COMPARISON:  DG CHEST 1V PORT dated 11/29/2013 at 8:15 a.m.  FINDINGS: Interval placement right internal jugular central venous catheter with distal tip projecting  at cavoatrial junction or proximal right atrium. No pneumothorax.  The cardiac silhouette appears at least moderately enlarged, status post median sternotomy for apparent coronary artery bypass grafting with multiple fractured sternotomy wires. Diffuse interstitial prominence is similar with retrocardiac consolidation and small left pleural effusion. Dual lead Left cardiac defibrillator in situ.  Multiple EKG lines overlie the patient and may obscure subtle underlying pathology. Patient is osteopenic. Soft tissue planes are nonsuspicious ; dense vascular calcifications within the included to right upper extremity.  IMPRESSION: New right internal jugular central venous catheter with distal tip projecting at cavoatrial junction/proximal right atrium. No pneumothorax.  Stable cardiomegaly and in at least moderate pulmonary edema, retrocardiac consolidation could reflect confluent edema though is nonspecific. Small stable left pleural  effusion.   Electronically Signed   By: Awilda Metroourtnay  Bloomer   On: 11/29/2013 13:13   Dg Chest Port 1 View  11/29/2013   CLINICAL DATA:  Decreased saturations, history CHF, diabetes, hypertension, cardiomyopathy, COPD  EXAM: PORTABLE CHEST - 1 VIEW  COMPARISON:  Portable exam 0815 hr compared to 11/29/2013 at 0136 hr  FINDINGS: Left subclavian AICD leads project over right atrium and right ventricle.  Enlargement of cardiac silhouette with pulmonary vascular congestion post median sternotomy.  Sternotomy wires are fractured.  Atherosclerotic calcification aorta.  Perihilar infiltrates most consistent with pulmonary edema.  No gross pleural effusion or pneumothorax.  Bones demineralized.  IMPRESSION: CHF, with pulmonary edema increased since previous study.   Electronically Signed   By: Ulyses SouthwardMark  Boles M.D.   On: 11/29/2013 08:32   Dg Chest Port 1 View  11/29/2013   CLINICAL DATA:  Evaluate infiltrates.  EXAM: PORTABLE CHEST - 1 VIEW  COMPARISON:  Chest x-ray 11/28/2013.  FINDINGS: Previously noted mild pulmonary edema has resolved. There continues to be some pulmonary venous congestion. Bibasilar opacities (left greater than right) are favored to predominantly reflect underlying subsegmental atelectasis, although airspace consolidation, particularly in the left lower lobe, is difficult to exclude. Small left pleural effusion. Heart size is mildly enlarged. The patient is rotated to the left on today's exam, resulting in distortion of the mediastinal contours and reduced diagnostic sensitivity and specificity for mediastinal pathology. Atherosclerosis in the thoracic aorta. Left-sided pacemaker/AICD with lead tips projecting over the expected location of the right atrium and right ventricular apex. Status post median sternotomy.  IMPRESSION: 1. Compared with yesterday's examination, the previously noted pulmonary edema has resolved. The radiographic appearance the chest is otherwise essentially unchanged, as above.    Electronically Signed   By: Trudie Reedaniel  Entrikin M.D.   On: 11/29/2013 02:26   Dg Chest Port 1v Same Day  11/28/2013   CLINICAL DATA:  Shortness of breath.  EXAM: PORTABLE CHEST - 1 VIEW SAME DAY  COMPARISON:  Chest x-ray 11/22/2013  FINDINGS: Prior CABG. Cardiac pacer noted with lead tips in right atrium and right ventricle. Severe cardiomegaly. Interim appearance of severe pulmonary venous congestion and interstitial prominence with left-sided pleural effusion. These findings are consistent with congestive heart failure and pulmonary edema. No pneumothorax. No acute osseus abnormality.  IMPRESSION: Severe congestive heart failure with pulmonary edema and left-sided pleural effusion .   Electronically Signed   By: Maisie Fushomas  Register   On: 11/28/2013 16:23     Assessment and Plan  1. PVD s/p L ICA stent assisted angioplasty 11/28/13, prior h/o BCEA  2. Acute respiratory failure due to #3  3. Acute on chronic systolic CHF with pulmonary edema post-procedure / history of ischemic cardiomyopathy (EF 30% in  2011, 25% by echo 11/30/13)  3. Hypotension, improved  4. H/o VT s/p MDT ICD - pre-op anesthesia notes from 11/2013 say "ventricular tachycardia s/p Med[employer] ICD '07 (nearing time for elective replacement; however as of 08/12/13 cardiology discussed end of life wishes and "he again expressed that he would rather have a lethal arrhythmia and pass away than to be shocked by a defibrillator or at least not have the surgery to have such an option. We discussed deactivating the ventricular defibrillation therapy and the current device; however, he wishes to leave everything like it is.")". He has been followed by [facility] cardiology. Device confirmed at [facility], will continue current plan as above.  5. CAD s/p CABG 1998, ruled out for MI this admission  6. Remote CVA  7. H/o L subdural hemorrhage 2011  8. Reported h/o PAF  9. Anemia  10. 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 Pseudomonas  UTI, per IM  Would not push med regimen any further this morning as BP's still borderline, but yesterday he seemed to tolerate new regimen. Suspect baseline Cr 1.2-1.3 Should f/u with [facility] closely after discharge. No new recs.  Signed, Ronie Spies PA-C Patient seen and examined. I agree with the assessment and plan as detailed above. See also my additional thoughts below.   At this point, overall cardiac status is stable. No change in his meds today. No further cardiac evaluation is planned during this hospitalization.  Willa Rough, MD, [facility] 12/04/2013 11:01 AM

## 2013-12-05 DIAGNOSIS — I739 Peripheral vascular disease, unspecified: Secondary | ICD-10-CM

## 2013-12-05 DIAGNOSIS — I2589 Other forms of chronic ischemic heart disease: Secondary | ICD-10-CM

## 2013-12-05 LAB — GLUCOSE, CAPILLARY
GLUCOSE-CAPILLARY: 186 mg/dL — AB (ref 70–99)
Glucose-Capillary: 175 mg/dL — ABNORMAL HIGH (ref 70–99)

## 2013-12-05 LAB — BASIC METABOLIC PANEL
BUN: 25 mg/dL — ABNORMAL HIGH (ref 6–23)
CALCIUM: 8.6 mg/dL (ref 8.4–10.5)
CO2: 23 meq/L (ref 19–32)
Chloride: 99 mEq/L (ref 96–112)
Creatinine, Ser: 1.38 mg/dL — ABNORMAL HIGH (ref 0.50–1.35)
GFR calc Af Amer: 56 mL/min — ABNORMAL LOW (ref >90)
GFR calc non Af Amer: 48 mL/min — ABNORMAL LOW (ref >90)
GLUCOSE: 169 mg/dL — AB (ref 70–99)
Potassium: 3.9 mEq/L (ref 3.7–5.3)
SODIUM: 137 meq/L (ref 137–147)

## 2013-12-05 MED ORDER — ROSUVASTATIN CALCIUM 40 MG PO TABS: 40.0000 mg | ORAL_TABLET | Freq: Every day | ORAL | Status: DC

## 2013-12-05 MED ORDER — ENALAPRIL MALEATE 2.5 MG PO TABS: 2.5000 mg | ORAL_TABLET | Freq: Every day | ORAL | Status: DC

## 2013-12-05 MED ORDER — CARVEDILOL 3.125 MG PO TABS: 3.1250 mg | ORAL_TABLET | Freq: Two times a day (BID) | ORAL | Status: AC

## 2013-12-05 MED ORDER — CIPROFLOXACIN HCL 500 MG PO TABS: 500.0000 mg | ORAL_TABLET | Freq: Two times a day (BID) | ORAL | Status: AC

## 2013-12-05 MED ORDER — ATORVASTATIN CALCIUM 40 MG PO TABS: 40.0000 mg | ORAL_TABLET | Freq: Every day | ORAL | Status: DC

## 2013-12-05 MED ORDER — FUROSEMIDE 20 MG PO TABS: 20.0000 mg | ORAL_TABLET | Freq: Every day | ORAL | Status: DC

## 2013-12-05 MED ORDER — GLIPIZIDE ER 10 MG PO TB24: 10.0000 mg | ORAL_TABLET | Freq: Every day | ORAL | Status: DC

## 2013-12-05 NOTE — Progress Notes (Signed)
Clinical social worker assisted with patient discharge to skilled nursing facility,[facility].  CSW addressed all family questions and concerns. CSW copied chart and added all important documents. CSW also set up patient transportation with [facility]. Clinical Social Worker will sign off for now as social work intervention is no longer needed.   Amed Datta, MSW, LCSWA [phone] 

## 2013-12-05 NOTE — Discharge Summary (Addendum)
Physician Discharge Summary  Francisco Brock ZOX:[id number] DOB: 1936-04-11 DOA: 11/28/2013  PCP: Terald Sleeper, MD  Admit date: 11/28/2013 Discharge date: 12/05/2013  Time spent: >45 minutes  Recommendations for Outpatient Follow-up:  1. F/u BP and CBGs BID 2. bmet in 4-5 days for evaluation for hypokalemia 3. Daily weights  4. outpt cardio f/u   Discharge Diagnoses:  Principal Problem:   Acute on chronic systolic CHF (congestive heart failure) Active Problems:   Dyslipidemia   Type II or unspecified type diabetes mellitus with peripheral circulatory disorders, uncontrolled(250.72)   CAD (coronary artery disease)   PVD (peripheral vascular disease)   Carotid stenosis   Bradycardia   Hypoxia   Cardiomyopathy, ischemic   ICD (implantable cardioverter-defibrillator) in place   Hypokalemia   Discharge Condition: stable Diet recommendation: heart healthy, diabetic  Filed Weights   12/03/13 0500 12/04/13 0548 12/05/13 0521  Weight: 65.3 kg (143 lb 15.4 oz) 65.091 kg (143 lb 8 oz) 65.091 kg (143 lb 8 oz)    History of present illness/ hospital course: Francisco Brock is a 78 y.o. male presenting on 11/11/2013 with  has a past medical history of CAD s/p CABG, Subdural hematoma; Amputation of legs;  Chronic systolic CHF (congestive heart failure); Diabetes mellitus without complication; Hypertension; Hyperlipemia; Peripheral vascular disease; COPD (chronic obstructive pulmonary disease); VT (ventricular tachycardia); Stroke (1998); Automatic implantable cardioverter-defibrillator in situ; Seizures; DVT (deep venous thrombosis); and Amputation finger who was in radiology for cerebral arteriogram after being cleared by cardiology. He was found to have a left ICA stenosis and had a stent assisted angioplasty. After extubation, he was noted to be stable and admitted to the hospitalist group for overnight monitoring on Heparin. In the middle of the night, he became bradycardic to the 30s and  later dyspneic and hypoxic. CXR reveal pulm edema and he was transferred to the ICU for hemodynamic instability and diuresed. Transferred to step down on 3/30. eventualy too hypotensive to further diurese. BP med adjusted by cardiology and are now much lower than home doses.    SIGNIFICANT EVENTS / STUDIES:  3/26 Cerebral Arteriogram - L ICA stent placed   Procedures:  RIJ CVL 3/27 >>>  L fem A Line 3/27 >>>  3/30 - TTE - EF 15-20%  Pulmonary Edema - acute on chronic severe systolic and diastolic CHF--ischemic cardiomyopathy -note previous EF was 25% and now 15-20% with wall motion abnormalities - also Grade 2 Diastolic dysf - Needs daily weight monitoring at nursing facility and continued cardiology f/u as outpt for further management.   Acute Respiratory Failure due to above Resolved - weaned to room air   Hypotension  ? due to heart failure - blood pressure has been marginal - diuretics have been resumed along with low-dose beta blocker - currently patient appears to be tolerating this well -  - cont to monitor closely at nursing home  Acute encephalopathy - question underlying dementia?? likely due to acute illness plus urinary tract infection  -still quite confused  UTI / pyelonephritis / possible prostatitis - Pseudomonas  UA markedly abnormal - culture revealed Pseudomonas sensitive to Cipro  - will need at least 7 days worth- started 3/31- stop date 4/6  S/p Left ICA stent  Care as per IR - on Plavix + asa    COPD  without evidence of acute exacerbation - no inhalers at home   CKD  baseline Cr 1.2-1.4 - likely prerenal v/s ATN due to hypotension - diuresis at discretion of Cardiology - see  Cr level at d/c below  DM  - will d/c Metformin as she is on lasix and high tendency for Cr to go from 1.3 to > 1.5 - place on Glipizide instead - sugars in high 100-to low 200 -  Dyslipidemia  Cont Statin  Hypokalemia  Replaced - stable   H/o CAD, PVD  CABG in 1999 -  status post left BKA & right AKA   Occasional Afib  currently paced rhythm with first degree AVF  - not on full dose anticoagulation due to h/o subdural ???  Hx of AICD  See complex hx per Cardiology note   H/o L subdural hemorrhage 2011   MRSA screen +  Code Status: FULL  Family Communication: no family present at time of exam  Disposition Plan: return to SNF    Consultants:  PCCM > TRH  Cardiology  Discharge Exam: Filed Vitals:   12/05/13 1019  BP: 107/52  Pulse: 74  Temp: 98.3  Resp: 18   General: No acute respiratory distress at rest - confused- thinks he lives at home with his daughter-- baseline?? Lungs: Clear to auscultation bilaterally without wheezes or crackles  Cardiovascular: Regular rate without murmur gallop or rub  Abdomen: Nontender, nondistended, soft, bowel sounds positive, no rebound, no ascites, no appreciable mass  Extremities: No significant cyanosis, clubbing, or edema bilateral lower extremity stumps   Discharge Instructions  Discharge Orders   Future Orders Complete By Expires   Diet - low sodium heart healthy  As directed    Comments:     And diabetic   Discharge instructions  As directed    Comments:     BID BP monitoring and CBG Daily weights   Increase activity slowly  As directed        Medication List    STOP taking these medications       digoxin 0.125 MG tablet  Commonly known as:  LANOXIN     eplerenone 25 MG tablet  Commonly known as:  INSPRA     metFORMIN 850 MG tablet  Commonly known as:  GLUCOPHAGE         TAKE these medications       aspirin 81 MG chewable tablet  Chew 81 mg by mouth daily.     rosuvastain 40 MG tablet  Take 1 tablet (40 mg total) by mouth daily at 6 PM.     bisacodyl 10 MG suppository  Commonly known as:  DULCOLAX  Place 10 mg rectally daily as needed for mild constipation or moderate constipation.     carvedilol 3.125 MG tablet  Commonly known as:  COREG  Take 1 tablet (3.125 mg  total) by mouth 2 (two) times daily with a meal.     ciprofloxacin 500 MG tablet  Commonly known as:  CIPRO  Take 1 tablet (500 mg total) by mouth 2 (two) times daily.     clopidogrel 75 MG tablet  Commonly known as:  PLAVIX  Take 75 mg by mouth daily.     enalapril 2.5 MG tablet  Commonly known as:  VASOTEC  Take 1 tablet (2.5 mg total) by mouth daily.     furosemide 20 MG tablet  Commonly known as:  LASIX  Take 1 tablet (20 mg total) by mouth daily.     glipiZIDE 10 MG 24 hr tablet  Commonly known as:  GLUCOTROL XL  Take 1 tablet (10 mg total) by mouth daily with breakfast.     HYDROcodone-acetaminophen 5-325 MG  per tablet  Commonly known as:  NORCO/VICODIN  Take 1-2 tablets by mouth every 6 (six) hours as needed for moderate pain.     MILK OF MAGNESIA PO  Take 30 mLs by mouth daily as needed (constipation).     multivitamin with minerals tablet  Take 1 tablet by mouth daily.     protective barrier Crea  Apply 1 application topically 3 (three) times daily as needed (to perlarea once a shift and after each incontinent episode).     ranolazine 500 MG 12 hr tablet  Commonly known as:  RANEXA  Take 500 mg by mouth 2 (two) times daily.     Vitamin D3 50000 UNITS Caps  Take 50,000 Units by mouth every 30 (thirty) days. On the 15th of every month       No Known Allergies    The results of significant diagnostics from this hospitalization (including imaging, microbiology, ancillary and laboratory) are listed below for reference.    Significant Diagnostic Studies: Dg Chest 2 View  11/22/2013   CLINICAL DATA:  Hypertension  EXAM: CHEST  2 VIEW  COMPARISON:  06/28/2012  FINDINGS: Cardiac shadow is enlarged but stable. Postsurgical changes are again seen. A defibrillator is again noted. Mild vascular congestion is seen. No focal infiltrate is noted.  IMPRESSION: Mild vascular congestion new from the prior exam.   Electronically Signed   By: Alcide Clever M.D.   On: 11/22/2013  15:53   Ir Delsa Sale Stent Cerv Carotid W/emb-prot Mod Sed  12/02/2013   CLINICAL DATA:  Patient with history of expressive aphasia and right-sided weakness with resolution of weakness with persistence of speech difficulty. 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 History of diabetes mellitus, hypertension and congestive heart failure.  EXAM: BILATERAL COMMON CAROTID ARTERY, RIGHT VERTEBRAL ARTERY ANGIOGRAMS FOLLOWED BY INTRAVSC STENT CERV CAROTID W/ EMB-PROT  ANESTHESIA/SEDATION: Conscious sedation.  MEDICATIONS: As per general anesthesia service.  CONTRAST:  OMNIPAQUE IOHEXOL 300 MG/ML  SOLN.  PROCEDURE: Following a full explanation of the procedure along with the potential associated complications, an informed witnessed consent was obtained.  The right groin was prepped and draped in the usual sterile fashion. Thereafter using a modified Seldinger technique, transfemoral access into the right common femoral artery was obtained without difficulty. Over a 0.035 inch guidewire, 5 French Pinnacle sheath was inserted. Through this and also over a 0.035 inch guidewire, 5 French JB1 catheter was advanced to the aortic arch region and selectively positioned in the right common carotid artery, the orifice of the right vertebral artery, and the left common carotid artery.  There were no acute complications. The patient tolerated the procedure well.  COMPLICATIONS: None immediate.  FINDINGS: The right common carotid arteriogram demonstrates mild narrowing of the origin of the right external carotid artery. Its branches are normally opacified.  The right internal carotid artery at the bulb through the cranial skull base opacifies normally.  The petrous, the cavernous and the supraclinoid segments are widely patent.  The right middle cerebral artery demonstrates an approximately 50% segmental stenoses in the M1 segment. The trifurcation branches  opacify. Focal areas of caliber irregularity are seen involving tertiary branches consistent with intracranial arteriosclerosis.  The right anterior cerebral artery is seen to opacify into the capillary and the venous phases. Cross filling via the anterior communicating artery of the left anterior cerebral A2 segment, the A1 segment and the left MCA distribution is seen. Unopacified blood is seen  in the left middle cerebral artery presumably from the ipsilateral left internal carotid artery.  Opacification of the right posterior communicating artery is seen into its distal distribution.  The right vertebral artery origin demonstrates a severe preocclusive stenosis.  The vessel opacifies normally through the cranial skull base otherwise.  There is normal opacification of the right posterior inferior cerebellar artery in the proximal right vertebrobasilar junction.  There is a focal tight narrowing of the right vertebrobasilar junction.  The opacified portion of the basilar artery, the posterior cerebral arteries, superior cerebellar arteries and the anterior-inferior cerebellar arteries is grossly normal into the delayed arterial phase.  The left common carotid arteriogram demonstrates the left external carotid artery to be normal at its origin. Its branches are normally opacified.  The left internal carotid artery just distal to the bulb has a severe stenosis of approximately 85% by NASCET criteria.  Distal to this, there is a 50% stenoses also by the NASCET criteria.  The vessel opacifies to the cranial skull base where there is a mild focal area of narrowing in the distal cervical segment.  The petrous, the cavernous segment proximally appears normal.  There is a tight focal approximately 85-90% stenosis of the left internal carotid artery supraclinoid segment.  Flow into the left middle cerebral artery is seen with unopacified blood seen from the contralateral right internal carotid artery via the anterior  communicating artery as described.  No visualization of the anterior cerebral artery circulation is seen.  Given the symptomatic focal tight stenosis of the left internal carotid artery proximally and intracranially, it was decided to proceed with stent assisted angioplasty of the proximal left internal carotid artery with distal protection. The left internal carotid artery measured approximately 3.7 mm in the normal segment just distal to the tight focal stenosis, and approximately 6.5 mm just proximal to the bifurcation.  The degree of stenosis was estimated to be approximately 80%. The length of the segment with tight stenosis was approximately 8 mm.  Given the patient's multiple medical problems, as described above, the patient is felt to be a suitable candidate for endovascular revascularization of this symptomatic tight focal stenosis proximally.  The diagnostic JB1 catheter in the left common carotid artery was exchanged over a 0.035 inch 300 cm Rosen exchange guidewire for a 6 French 80 cm Cook shuttle sheath using biplane roadmap technique and constant fluoroscopic guidance. The guidewire was removed. Good aspiration was obtained from the hub of the 6 [place] Cook shuttle sheath.  A gentle constant injection demonstrated no evidence of spasms, dissections or of intraluminal filling defects.  At this time, a large 4mm -7 mm distal protection device was then captured into its delivery system after having been cleared of air in its housing using heparinized saline infusion at its filter end, and its delivery end.  The filter was captured into the delivery device and locked into position.  A J-tip was formed at the distal end of the 0.014 inch wire.  In a coaxial manner and with constant heparinized saline infusion using biplane roadmap technique and constant fluoroscopic guidance, the combination of the filter delivery microcatheter with the filter wire was advanced to the distal end of the 6 [place] Cook  shuttle sheath.  Using a torque device and roadmap technique, the combination was then advanced without difficulty across tight narrowing at the proximal aspect of the left internal carotid artery. The combination of the filter wire and the filter microcatheter were then advanced to the distal vertical  segment. The wire was positioned in the horizontal petrous segment.  The filter was then deployed in the usual fashion by retrieving the key at the hub of the delivery microcatheter for the filter.  The delivery microcatheter was then retrieved and removed whilst maintaining constant positioning of the deployed filter in the distal vertical segment of the left internal carotid artery.  A control arteriogram performed through the 6 [place] Cook shuttle sheath demonstrated positioning of the filter in the distal aspect. There was no impairment of flow into the intracranial circulation.  A 4 mm x 20 mm 0.014 rapid exchange balloon angioplasty catheter which had been purged with heparized saline infusion was then advanced using the rapid exchange system and placed across the tight narrowing at the proximal aspect of the left internal carotid artery.  This was then slowly expanded to its diameter of 4 mm using a Micro inflation suturing device via microtubing. The balloon was left inflated for approximately 15 seconds. It was then deflated and retrieved proximally. A control arteriogram performed through the 6 [place] Cook shuttle sheath demonstrates significantly improved flow through the angioplastied segment proximally, and also intracranially.  The angioplasty balloon catheter was then removed.  At this time a 6 mm stent was purged with heparinized saline infusion.  Again using the rapid exchange mechanism, the delivery catheter of the stent was then advanced to the distal end of the 6 [place] Cook shuttle sheath using biplane roadmap technique and constant fluoroscopic guidance.  Markers of the stent were then positioned  such that there was excellent apposition proximal distally.  This was then deployed in the usual fashion without difficulty. The delivery microcatheter of the stent was then retrieved and removed maintaining the distal filter device in a safe location.  A control arteriogram performed through the 6 [place] Cook shuttle sheath demonstrated excellent flow through the stented segment of the left internal carotid artery proximally.  The waist segment at the site of the area was then angioplastied using a 5 mm x 20 mm Viatrac 0.014 angioplasty balloon catheter. Again this was advanced in a coaxial manner and with constant heparinized saline infusion using biplane roadmap technique and constant fluoroscopic guidance and a rapid exchange mechanism.  The balloon was then expanded to its normal diameter using microinflation syringe device via microtubing. It was maintained there for approximately 15 seconds.  The balloon was then deflated and retrieved and removed. The distal end of the filter wire was kept in a safe stable position.  A control arteriogram was performed through the 6 [place] Cook shuttle sheath demonstrate excellent flow through the angioplastied and stented segment of the left internal carotid artery. Excellent flow was now seen into the petrous, the cavernous and the supraclinoid segments. Improved hemodynamic flow of contrast was seen in the left MCA distribution. No change was seen in the 85% to 90% stenosis of the supraclinoid left ICA. Also no flow was seen in the left anterior cerebral artery. A filter capture micro catheter was then advanced by coaxial matter and with constant heparinized saline infusion using biplane roadmap technique and constant fluoroscopic guidance through the stented segment without difficulty. It was then advanced until there was kissing with the proximal marker filter. The wire was then retrieved capturing the filter in the filter capture device. The combination was then gently  retrieved and removed under constant fluoroscopic guidance and showing no entanglement with the stent. None was observed.  Control arteriograms were then performed at 15 and 30 minutes after  that. These continue to demonstrate excellent flow through the stent and also intracranially.  Clinically the patient remained stable. Neurologically the patient remained unchanged.  The patient's ACT was maintained in the region of approximately 180 seconds. The 6 [place] Cook shuttle sheath was then retrieved into the abdominal aorta and exchanged over a J-tip guidewire for a 6 [place] Pinnacle sheath. This was then successfully removed and replaced with an external closure device.  Patient was then transferred to the neuro ICU for overnight observations neurologically, and to continue IV heparin. Close monitoring of the blood pressure was continued.  IMPRESSION: Status post stent assisted angioplasty of symptomatic severe stenoses of the left internal carotid artery proximally using a distal protection device as described without event.  Unchanged 85 to 90% stenosis of the left internal carotid artery supraclinoid segment.   Electronically Signed   By: Julieanne Cotton M.D.   On: 12/02/2013 08:58   Dg Chest Port 1 View  11/30/2013   CLINICAL DATA:  Short of breath.  EXAM: PORTABLE CHEST - 1 VIEW  COMPARISON:  11/29/2013  FINDINGS: Pulmonary edema has improved with no significant edema remaining. Is minor lung base opacity that is most likely atelectasis.  Cardiac silhouette is mildly enlarged. Changes from cardiac surgery are stable. Right internal jugular central venous line is stable in well positioned as is the left anterior chest wall AICD.  IMPRESSION: Significant improvement, with essentially resolution of pulmonary edema.   Electronically Signed   By: Amie Portland M.D.   On: 11/30/2013 14:32   Dg Chest Port 1 View  11/29/2013   CLINICAL DATA:  Central line placement.  EXAM: PORTABLE CHEST - 1 VIEW  COMPARISON:   DG CHEST 1V PORT dated 11/29/2013 at 8:15 a.m.  FINDINGS: Interval placement right internal jugular central venous catheter with distal tip projecting at cavoatrial junction or proximal right atrium. No pneumothorax.  The cardiac silhouette appears at least moderately enlarged, status post median sternotomy for apparent coronary artery bypass grafting with multiple fractured sternotomy wires. Diffuse interstitial prominence is similar with retrocardiac consolidation and small left pleural effusion. Dual lead Left cardiac defibrillator in situ.  Multiple EKG lines overlie the patient and may obscure subtle underlying pathology. Patient is osteopenic. Soft tissue planes are nonsuspicious ; dense vascular calcifications within the included to right upper extremity.  IMPRESSION: New right internal jugular central venous catheter with distal tip projecting at cavoatrial junction/proximal right atrium. No pneumothorax.  Stable cardiomegaly and in at least moderate pulmonary edema, retrocardiac consolidation could reflect confluent edema though is nonspecific. Small stable left pleural effusion.   Electronically Signed   By: Awilda Metro   On: 11/29/2013 13:13   Dg Chest Port 1 View  11/29/2013   CLINICAL DATA:  Decreased saturations, history CHF, diabetes, hypertension, cardiomyopathy, COPD  EXAM: PORTABLE CHEST - 1 VIEW  COMPARISON:  Portable exam 0815 hr compared to 11/29/2013 at 0136 hr  FINDINGS: Left subclavian AICD leads project over right atrium and right ventricle.  Enlargement of cardiac silhouette with pulmonary vascular congestion post median sternotomy.  Sternotomy wires are fractured.  Atherosclerotic calcification aorta.  Perihilar infiltrates most consistent with pulmonary edema.  No gross pleural effusion or pneumothorax.  Bones demineralized.  IMPRESSION: CHF, with pulmonary edema increased since previous study.   Electronically Signed   By: Ulyses Southward M.D.   On: 11/29/2013 08:32   Dg Chest Port  1 View  11/29/2013   CLINICAL DATA:  Evaluate infiltrates.  EXAM: PORTABLE CHEST -  1 VIEW  COMPARISON:  Chest x-ray 11/28/2013.  FINDINGS: Previously noted mild pulmonary edema has resolved. There continues to be some pulmonary venous congestion. Bibasilar opacities (left greater than right) are favored to predominantly reflect underlying subsegmental atelectasis, although airspace consolidation, particularly in the left lower lobe, is difficult to exclude. Small left pleural effusion. Heart size is mildly enlarged. The patient is rotated to the left on today's exam, resulting in distortion of the mediastinal contours and reduced diagnostic sensitivity and specificity for mediastinal pathology. Atherosclerosis in the thoracic aorta. Left-sided pacemaker/AICD with lead tips projecting over the expected location of the right atrium and right ventricular apex. Status post median sternotomy.  IMPRESSION: 1. Compared with yesterday's examination, the previously noted pulmonary edema has resolved. The radiographic appearance the chest is otherwise essentially unchanged, as above.   Electronically Signed   By: Trudie Reedaniel  Entrikin M.D.   On: 11/29/2013 02:26   Dg Chest Port 1v Same Day  11/28/2013   CLINICAL DATA:  Shortness of breath.  EXAM: PORTABLE CHEST - 1 VIEW SAME DAY  COMPARISON:  Chest x-ray 11/22/2013  FINDINGS: Prior CABG. Cardiac pacer noted with lead tips in right atrium and right ventricle. Severe cardiomegaly. Interim appearance of severe pulmonary venous congestion and interstitial prominence with left-sided pleural effusion. These findings are consistent with congestive heart failure and pulmonary edema. No pneumothorax. No acute osseus abnormality.  IMPRESSION: Severe congestive heart failure with pulmonary edema and left-sided pleural effusion .   Electronically Signed   By: Maisie Fushomas  Register   On: 11/28/2013 16:23    Microbiology: Recent Results (from the past 240 hour(s))  MRSA PCR SCREENING      Status: Abnormal   Collection Time    11/28/13  2:22 PM      Result Value Ref Range Status   MRSA by PCR POSITIVE (*) NEGATIVE Final   Comment:            The GeneXpert MRSA Assay (FDA     approved for NASAL specimens     only), is one component of a     comprehensive MRSA colonization     surveillance program. It is not     intended to diagnose MRSA     infection nor to guide or     monitor treatment for     MRSA infections.     RESULT CALLED TO, READ BACK BY AND VERIFIED WITH:     A. [facility] LLC[facility] RN 16:35 11/28/13 ([email or web address])  URINE CULTURE     Status: None   Collection Time    12/02/13 12:17 PM      Result Value Ref Range Status   Specimen Description URINE, CLEAN CATCH   Final   Special Requests NONE   Final   Culture  Setup Time     Final   Value: 12/02/2013 17:43     Performed at [facility]   Colony Count     Final   Value: >=100,000 COLONIES/ML     Performed at [facility]   Culture     Final   Value: PSEUDOMONAS AERUGINOSA     Performed at [facility]   Report Status 12/04/2013 FINAL   Final   Organism ID, Bacteria PSEUDOMONAS AERUGINOSA   Final     Labs: Basic Metabolic Panel:  Recent Labs Lab 11/28/13 2320  12/01/13 0544 12/02/13 0243 12/03/13 0252 12/04/13 0525 12/05/13 0618  NA 136*  < > 136* 138 136* 138 137  K 4.3  < >  3.3* 3.9 4.0 3.8 3.9  CL 99  < > 96 97 96 97 99  CO2 24  < > 25 26 26 24 23   GLUCOSE 193*  < > 151* 99 144* 170* 169*  BUN 23  < > 26* 29* 23 21 25*  CREATININE 1.40*  < > 1.37* 1.67* 1.35 1.38* 1.38*  CALCIUM 8.7  < > 8.8 8.9 9.1 8.9 8.6  MG 1.7  --   --   --   --   --   --   < > = values in this interval not displayed. Liver Function Tests:  Recent Labs Lab 12/03/13 0252  AST 14  ALT 7  ALKPHOS 61  BILITOT 0.5  PROT 7.6  ALBUMIN 3.0*   No results found for this basename: LIPASE, AMYLASE,  in the last 168 hours No results found for this basename: AMMONIA,  in the last 168 hours CBC:  Recent  Labs Lab 11/28/13 2320  11/29/13 1430 11/30/13 0500 12/01/13 0544 12/02/13 0243 12/03/13 0252  WBC 11.8*  < > 9.5 12.4* 12.2* 12.1* 11.5*  NEUTROABS 8.5*  --   --   --  8.7* 8.1*  --   HGB 11.3*  < > 10.8* 11.2* 11.1* 11.6* 11.2*  HCT 33.6*  < > 32.2* 34.1* 33.0* 34.9* 33.4*  MCV 93.6  < > 93.9 93.4 92.7 94.1 92.8  PLT 226  < > 221 231 225 244 247  < > = values in this interval not displayed. Cardiac Enzymes:  Recent Labs Lab 11/29/13 1403 11/29/13 2003 11/30/13 0203  TROPONINI <0.30 <0.30 <0.30   BNP: BNP (last 3 results)  Recent Labs  11/29/13 1500  PROBNP 2846.0*   CBG:  Recent Labs Lab 12/04/13 1142 12/04/13 1642 12/04/13 2051 12/05/13 0802 12/05/13 1139  GLUCAP 259* 147* 297* 186* 175*       Signed:  Byrant Valent  [facility] 12/05/2013, 11:54 AM

## 2013-12-05 NOTE — Progress Notes (Signed)
Attempted to call [name] to give report on hold.

## 2013-12-05 NOTE — Progress Notes (Addendum)
Attempted to call report to [facility] line busy  will retry

## 2013-12-05 NOTE — Progress Notes (Signed)
[facility] to be busy at [facility] will continue to call in attempt to give report. TC to daughter to inform pt will be discharged today-Monica

## 2013-12-09 ENCOUNTER — Other Ambulatory Visit: Payer: Self-pay | Admitting: *Deleted

## 2013-12-09 MED ORDER — HYDROCODONE-ACETAMINOPHEN 5-325 MG PO TABS: ORAL_TABLET | ORAL | Status: DC

## 2013-12-09 NOTE — Addendum Note (Signed)
Addendum created 12/09/13 1907 by Heather RobertsJames Alford Gamero, MD   Modules edited: Anesthesia Attestations

## 2013-12-09 NOTE — Telephone Encounter (Signed)
[facility] 

## 2013-12-13 ENCOUNTER — Encounter: Payer: Self-pay | Admitting: Internal Medicine

## 2013-12-13 NOTE — Progress Notes (Signed)
Patient ID: Francisco Brock, male   DOB: [birth year], 78 y.o.   MRN: [id number]  Provider:  Gwenith Spitz. Renato Gails, D.O., C.M.D.  Location:  [facility]  PCP: Terald Sleeper, MD  Code Status: full code  No Known Allergies  Chief Complaint  Patient presents with  . Hospitalization Follow-up    readmission s/p left carotid angioplasty 3/26, had to stay overnight due to CHF exacerbation postop, had some bp med changes and coreg was reduced, now on lasix    HPI: 78 y.o. male here for long term care with h/o systolic chf, CAD, ischemic cardiomyopathy, subdural hematoma with traumatic brain injury in 2011, stroke, symptomatic carotid stenosis, htn, PVD s/p amputations bilaterally, DMII with circulatory complications, bradycardia, s/p ICD was readmitted s/p hospitalization for left carotid angioplasty 3/26.  His stay was complicated by acute on chronic systolic chf requiring diuresis with lasix and bp med adjustments.  ROS: ROS   Past Medical History  Diagnosis Date  . CAD (coronary artery disease)     a. s/p CABG 1998.  . Subdural hematoma     a. TBI 2011.  . 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 Cardiac defibrillator in place   . Chronic systolic CHF (congestive heart failure)     a. ICM.  Marland Kitchen Diabetes mellitus without complication   . Hypertension   . Hyperlipemia   . Peripheral vascular disease   . COPD (chronic obstructive pulmonary disease)   . VT (ventricular tachycardia)     a. s/p Medtronic ICD 2007, nearing ERI but patient had declined ERI changeout.  . Stroke 1998    no residual  . Automatic implantable cardioverter-defibrillator in situ   . Seizures     last one maybe 2012  . History of blood transfusion   . DVT (deep venous thrombosis)   . 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 Leg amputation above knee      right  . Leg amputation below knee      left  .  Coronary artery bypass graft  2004  . Cardiac defibrillator placement  2007  . Balloon angioplasty, artery      rt lower leg  . Carotid endarterectomy      bilateral  . Amputation  06/28/2012    Procedure: AMPUTATION DIGIT;  Surgeon: Tami Ribas, MD;  Location: [facility] OR;  Service: Orthopedics;  Laterality: Left;  left  distal index finger  . Eye surgery Bilateral   . Radiology with anesthesia N/A 11/28/2013    Procedure: ANGIO WITH CAROTID STENT ;  Surgeon: Oneal Grout, MD;  Location: [facility] OR;  Service: Radiology;  Laterality: N/A;   Social History:   reports that he has quit smoking. His smoking use included Cigars. He does not have any smokeless tobacco history on file. He reports that he does not drink alcohol or use illicit drugs.  No family history on file.  Medications: Patient's Medications  New Prescriptions   No medications on file  Previous Medications   ASPIRIN 81 MG CHEWABLE TABLET    Chew 81 mg by mouth daily.   BISACODYL (DULCOLAX) 10 MG SUPPOSITORY    Place 10 mg rectally daily as needed for mild constipation or moderate constipation.   CARVEDILOL (COREG) 3.125 MG TABLET    Take 1 tablet (3.125 mg total) by mouth 2 (two)  times daily with a meal.   CHOLECALCIFEROL (VITAMIN D3) 50000 UNITS CAPS    Take 50,000 Units by mouth every 30 (thirty) days. On the 15th of every month   CLOPIDOGREL (PLAVIX) 75 MG TABLET    Take 75 mg by mouth daily.   ENALAPRIL (VASOTEC) 2.5 MG TABLET    Take 1 tablet (2.5 mg total) by mouth daily.   FUROSEMIDE (LASIX) 20 MG TABLET    Take 1 tablet (20 mg total) by mouth daily.   GLIPIZIDE (GLUCOTROL XL) 10 MG 24 HR TABLET    Take 1 tablet (10 mg total) by mouth daily with breakfast.   HYDROCODONE-ACETAMINOPHEN (NORCO/VICODIN) 5-325 MG PER TABLET    Take one tablet by mouth every 6 hours as needed for pain; Take two tablets by mouth every 6 hours as needed for pain   MAGNESIUM HYDROXIDE (MILK OF MAGNESIA PO)    Take 30 mLs by mouth daily as  needed (constipation).   MULTIPLE VITAMINS-MINERALS (MULTIVITAMIN WITH MINERALS) TABLET    Take 1 tablet by mouth daily.   PROTECTIVE BARRIER (RESTORE) CREA    Apply 1 application topically 3 (three) times daily as needed (to perlarea once a shift and after each incontinent episode).   RANOLAZINE (RANEXA) 500 MG 12 HR TABLET    Take 500 mg by mouth 2 (two) times daily.   ROSUVASTATIN (CRESTOR) 40 MG TABLET    Take 1 tablet (40 mg total) by mouth daily.  Modified Medications   No medications on file  Discontinued Medications   No medications on file     Physical Exam: There were no vitals filed for this visit. Physical Exam   Labs reviewed: Basic Metabolic Panel:  Recent Labs  09/81/19 2320  12/03/13 0252 12/04/13 0525 12/05/13 0618  NA 136*  < > 136* 138 137  K 4.3  < > 4.0 3.8 3.9  CL 99  < > 96 97 99  CO2 24  < > 26 24 23   GLUCOSE 193*  < > 144* 170* 169*  BUN 23  < > 23 21 25*  CREATININE 1.40*  < > 1.35 1.38* 1.38*  CALCIUM 8.7  < > 9.1 8.9 8.6  MG 1.7  --   --   --   --   < > = values in this interval not displayed. Liver Function Tests:  Recent Labs  11/22/13 1454 12/03/13 0252  AST 15 14  ALT 9 7  ALKPHOS 68 61  BILITOT 0.4 0.5  PROT 7.8 7.6  ALBUMIN 3.3* 3.0*   No results found for this basename: LIPASE, AMYLASE,  in the last 8760 hours No results found for this basename: AMMONIA,  in the last 8760 hours CBC:  Recent Labs  11/28/13 2320  12/01/13 0544 12/02/13 0243 12/03/13 0252  WBC 11.8*  < > 12.2* 12.1* 11.5*  NEUTROABS 8.5*  --  8.7* 8.1*  --   HGB 11.3*  < > 11.1* 11.6* 11.2*  HCT 33.6*  < > 33.0* 34.9* 33.4*  MCV 93.6  < > 92.7 94.1 92.8  PLT 226  < > 225 244 247  < > = values in this interval not displayed. Cardiac Enzymes:  Recent Labs  11/29/13 1403 11/29/13 2003 11/30/13 0203  TROPONINI <0.30 <0.30 <0.30   BNP: No components found with this basename: POCBNP,  CBG:  Recent Labs  12/04/13 2051 12/05/13 0802  12/05/13 1139  GLUCAP 297* 186* 175*   Imaging and Procedures:  Assessment/Plan No problem-specific  assessment & plan notes found for this encounter.   Functional status:  Family/ staff Communication:   Labs/tests ordered:    This encounter was created in error - please disregard.

## 2013-12-23 ENCOUNTER — Non-Acute Institutional Stay (SKILLED_NURSING_FACILITY): Payer: PRIVATE HEALTH INSURANCE | Admitting: Internal Medicine

## 2013-12-23 DIAGNOSIS — I509 Heart failure, unspecified: Secondary | ICD-10-CM

## 2013-12-23 NOTE — Progress Notes (Signed)
Patient ID: Francisco LairBenigno Brock, male   DOB: 1936/06/09, 78 y.o.   MRN: [id number] [facility] Chief complaint; followup recent hospitalization, congestive heart failure History; this is a patient who is followed by [facility] in the facility. He was admitted to the hospital for a left carotid stent assisted angioplasty this was done at the end of March. He was admitted overnight for heparin drip. He became bradycardic in the 30s chest x-ray revealed pulmonary edema and he was transferred to the ICU. He was eventually to hypotensive to further diuresis. Blood pressure medications were adjusted by cardiology and are now much lower than preadmission dosages.  I reviewed his echocardiogram done in this admission. His ejection fraction is dropped now to 15-20% vs. 25% previously. He also has grade 2 diastolic dysfunction. His troponin I's were negative x3. Is also noted to have a Pseudomonas UTI in the hospital was given 7 days of Cipro  It appears that his weight has been increasing now noted to be 152 pounds on 4/14. His Lasix has been increased from 20 daily on admission now to 40 twice a day. His blood pressure seems to be tolerating this.  Lab work from 4/13 shows essentially normal BUN and creatinine at 22 and 1.35. Potassium and sodium were normal. His BNP was 512.6 chest x-ray on 4/8 showed cardiac enlargement without acute abnormality  Past Medical History  Diagnosis Date  . CAD (coronary artery disease)     a. s/p CABG 1998.  . Subdural hematoma     a. TBI 2011.  . 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 Cardiac defibrillator in place   . Chronic systolic CHF (congestive heart failure)     a. ICM.  Marland Kitchen. Diabetes mellitus without complication   . Hypertension   . Hyperlipemia   . Peripheral vascular disease   . COPD (chronic obstructive pulmonary disease)   . VT (ventricular tachycardia)     a. s/p Medtronic ICD 2007, nearing ERI but patient had declined ERI changeout.   . Stroke 1998    no residual  . Automatic implantable cardioverter-defibrillator in situ   . Seizures     last one maybe 2012  . History of blood transfusion   . DVT (deep venous thrombosis)   . 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 Leg amputation above knee      right  . Leg amputation below knee      left  . Coronary artery bypass graft  2004  . Cardiac defibrillator placement  2007  . Balloon angioplasty, artery      rt lower leg  . Carotid endarterectomy      bilateral  . Amputation  06/28/2012    Procedure: AMPUTATION DIGIT;  Surgeon: Tami RibasKevin R Kuzma, MD;  [facility];  Service: Orthopedics;  Laterality: Left;  left  distal index finger  . Eye surgery Bilateral   . Radiology with anesthesia N/A 11/28/2013    Procedure: ANGIO WITH CAROTID STENT ;  Surgeon: Oneal GroutSanjeev K Deveshwar, MD;  Location: [facility] OR;  Service: Radiology;  Laterality: N/A;    Review of systems Gen. he looks more fatigued than I am used to seeing him albeit I am seeing him later in the day Respiratory no cough no sputum Cardiac no chest pain  GI no abdominal pain  Physical examination Gen. somewhat more fatigued-looking  than I am used to seeing albeit at times seeing him later in the day Respiratory shower but otherwise clear entry Cardiac heart sounds are normal soft systolic murmur. No carotid bruits. His jugular venous pressure is not elevated. He has 2+ edema in his stumps to put out 1+ edema in his coccyx Abdomen distended however no liver spleen or tenderness no clear fluid shift to.  Impression/plan #1 congestive heart failure with this severe ischemic cardiomyopathy. This is at its end stages. I agree with the increases in Lasix the day that. He should have another followup basic metabolic panel tomorrow. Careful followup of his weights will be necessary I just can't put my hands on the weights tonight #2 Pseudomonas UTI when he was  in the hospital. He was treated with Cipro. Might be reasonable to repeat his urine

## 2013-12-27 ENCOUNTER — Other Ambulatory Visit (HOSPITAL_COMMUNITY): Payer: Self-pay | Admitting: Interventional Radiology

## 2013-12-27 ENCOUNTER — Telehealth (HOSPITAL_COMMUNITY): Payer: Self-pay | Admitting: Interventional Radiology

## 2013-12-27 DIAGNOSIS — I771 Stricture of artery: Secondary | ICD-10-CM

## 2013-12-27 NOTE — Telephone Encounter (Signed)
Called pt, spoke to his daughter. She will call me back to schedule appt. JM

## 2014-01-03 ENCOUNTER — Ambulatory Visit (HOSPITAL_COMMUNITY)
Admission: RE | Admit: 2014-01-03 | Discharge: 2014-01-03 | Disposition: A | Discharge status: Home / Self Care | Payer: PRIVATE HEALTH INSURANCE | Source: Ambulatory Visit | Attending: Interventional Radiology | Admitting: Interventional Radiology

## 2014-01-03 DIAGNOSIS — I771 Stricture of artery: Secondary | ICD-10-CM

## 2014-01-25 ENCOUNTER — Non-Acute Institutional Stay (SKILLED_NURSING_FACILITY): Payer: PRIVATE HEALTH INSURANCE | Admitting: Internal Medicine

## 2014-01-25 DIAGNOSIS — I509 Heart failure, unspecified: Secondary | ICD-10-CM

## 2014-01-25 DIAGNOSIS — I2510 Atherosclerotic heart disease of native coronary artery without angina pectoris: Secondary | ICD-10-CM

## 2014-01-25 DIAGNOSIS — E1129 Type 2 diabetes mellitus with other diabetic kidney complication: Secondary | ICD-10-CM

## 2014-01-25 DIAGNOSIS — E1165 Type 2 diabetes mellitus with hyperglycemia: Secondary | ICD-10-CM

## 2014-01-25 NOTE — Progress Notes (Signed)
Patient ID: Francisco Brock, male   DOB: 1935-11-02, 78 y.o.   MRN: [id number]  [facility] Chief complaint; followup  congestive heart failure,  History; this is a patient who is followed by [facility] in the facility. He was admitted to the hospital for a left carotid stent assisted angioplasty this was done at the end of March. He was admitted overnight for heparin drip. He became bradycardic in the 30s chest x-ray revealed pulmonary edema and he was transferred to the ICU. He was eventually to hypotensive to further diuresis. Blood pressure medications were adjusted by cardiology and are now much lower than preadmission dosages.  I reviewed his echocardiogram done in this admission. His ejection fraction is dropped now to 15-20% vs. 25% previously. He also has grade 2 diastolic dysfunction. His troponin I's were negative x3. Is also noted to have a Pseudomonas UTI in the hospital was given 7 days of Cipro  It appears that his weight has been increasing now noted to be 152 pounds on 4/14. His Lasix has been increased from 20 daily on admission now to 40 twice a day. His blood pressure seems to be tolerating this.  He was treated earlier this month for her cough and congestion. He has known COPD. He was given Rocephin and clindamycin as well as a prednisone taper.   Lab work from 5/11 showed a white count of 13.4, hemoglobin of 12.7, sodium of 137, BUN of 26 creatinine of 1.47 and a BNP of 883.6. I do not see a previous BNP for comparison  Past Medical History  Diagnosis Date  . CAD (coronary artery disease)     a. s/p CABG 1998.  . Subdural hematoma     a. TBI 2011.  . 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 Cardiac defibrillator in place   . Chronic systolic CHF (congestive heart failure)     a. ICM.  Marland Kitchen. Diabetes mellitus without complication   . Hypertension   . Hyperlipemia   . Peripheral vascular disease   . COPD (chronic obstructive pulmonary disease)   . VT  (ventricular tachycardia)     a. s/p Medtronic ICD 2007, nearing ERI but patient had declined ERI changeout.  . Stroke 1998    no residual  . Automatic implantable cardioverter-defibrillator in situ   . Seizures     last one maybe 2012  . History of blood transfusion   . DVT (deep venous thrombosis)   . 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 Leg amputation above knee      right  . Leg amputation below knee      left  . Coronary artery bypass graft  2004  . Cardiac defibrillator placement  2007  . Balloon angioplasty, artery      rt lower leg  . Carotid endarterectomy      bilateral  . Amputation  06/28/2012    Procedure: AMPUTATION DIGIT;  Surgeon: Tami RibasKevin R Kuzma, MD;  [facility];  Service: Orthopedics;  Laterality: Left;  left  distal index finger  . Eye surgery Bilateral   . Radiology with anesthesia N/A 11/28/2013    Procedure: ANGIO WITH CAROTID STENT ;  Surgeon: Oneal GroutSanjeev K Deveshwar, MD;  Location: [facility] OR;  Service: Radiology;  Laterality: N/A;   Medications Carvedilol 3.125 twice a day Nitroglycerin when necessary Duo nebs every 4  hours when necessary Norco 5/325 one tablet every 6 hours when necessary Ranexa ER 500 1 tab bid  ASA 81 daily Plavix 75 daily Vasotec 2.5 daily Glipizide ER 10 mg daily KCl 10 mEq daily Claritin 10 mEq daily Crestor 20 daily 10 NovoLog sliding scale Mucinex 600 twice a day Lasix 40 twice a day   Review of systems Gen. he looks more fatigued than I am used to seeing him albeit I am seeing him later in the day Respiratory no cough no sputum Cardiac no chest pain  GI no abdominal pain  Physical examination  Temperature 97.8 pulse 62 respirations 18 blood pressure 126/70 weight 150 pounds O2 sat is 95% on room Gen. somewhat more fatigued-looking than I am used to seeing albeit at times seeing him later in the day Respiratory shower but otherwise clear entry Cardiac  heart sounds are normal soft systolic murmur. No carotid bruits. His jugular venous pressure is not elevated. He has 2+ edema in his left stumps to put out 1+ edema in his coccyx Abdomen distended however no liver spleen or tenderness no clear fluid shift to.  Impression/plan #1 congestive heart failure with this severe ischemic cardiomyopathy. He has an AICD. ACE inhibitor should probably be pushed as much as his blood pressure and BUN and creatinine will allow. He is slightly on the fluid volume overloaded side he has edema in the left stump and some edema in the coccyx area #2 COPD this does not appear to be unstable #3 diabetic-induced renal failure this does not appear to be unstable. We'll need to keep a close eye on his BUN and creatinine most recently at 26 and 1.47 on 5/11 #2 Pseudomonas UTI when he was in the hospital. He was treated with Cipro. Might be reasonable to repeat his urine

## 2014-03-02 ENCOUNTER — Emergency Department (HOSPITAL_COMMUNITY): Payer: PRIVATE HEALTH INSURANCE

## 2014-03-02 ENCOUNTER — Encounter (HOSPITAL_COMMUNITY): Payer: Self-pay | Admitting: Emergency Medicine

## 2014-03-02 ENCOUNTER — Inpatient Hospital Stay (HOSPITAL_COMMUNITY)
Admission: EM | Admit: 2014-03-02 | Discharge: 2014-04-05 | DRG: 871 | Disposition: E | Payer: PRIVATE HEALTH INSURANCE | Attending: Internal Medicine | Admitting: Internal Medicine

## 2014-03-02 DIAGNOSIS — E162 Hypoglycemia, unspecified: Secondary | ICD-10-CM

## 2014-03-02 DIAGNOSIS — E872 Acidosis, unspecified: Secondary | ICD-10-CM

## 2014-03-02 DIAGNOSIS — Z794 Long term (current) use of insulin: Secondary | ICD-10-CM

## 2014-03-02 DIAGNOSIS — Z87820 Personal history of traumatic brain injury: Secondary | ICD-10-CM

## 2014-03-02 DIAGNOSIS — K7200 Acute and subacute hepatic failure without coma: Secondary | ICD-10-CM

## 2014-03-02 DIAGNOSIS — E876 Hypokalemia: Secondary | ICD-10-CM

## 2014-03-02 DIAGNOSIS — E1169 Type 2 diabetes mellitus with other specified complication: Secondary | ICD-10-CM

## 2014-03-02 DIAGNOSIS — R4182 Altered mental status, unspecified: Secondary | ICD-10-CM | POA: Diagnosis not present

## 2014-03-02 DIAGNOSIS — Z7982 Long term (current) use of aspirin: Secondary | ICD-10-CM

## 2014-03-02 DIAGNOSIS — R748 Abnormal levels of other serum enzymes: Secondary | ICD-10-CM

## 2014-03-02 DIAGNOSIS — S88119A Complete traumatic amputation at level between knee and ankle, unspecified lower leg, initial encounter: Secondary | ICD-10-CM

## 2014-03-02 DIAGNOSIS — D696 Thrombocytopenia, unspecified: Secondary | ICD-10-CM | POA: Diagnosis not present

## 2014-03-02 DIAGNOSIS — I255 Ischemic cardiomyopathy: Secondary | ICD-10-CM

## 2014-03-02 DIAGNOSIS — Z7902 Long term (current) use of antithrombotics/antiplatelets: Secondary | ICD-10-CM

## 2014-03-02 DIAGNOSIS — J9600 Acute respiratory failure, unspecified whether with hypoxia or hypercapnia: Secondary | ICD-10-CM | POA: Diagnosis present

## 2014-03-02 DIAGNOSIS — Z87891 Personal history of nicotine dependence: Secondary | ICD-10-CM

## 2014-03-02 DIAGNOSIS — R945 Abnormal results of liver function studies: Secondary | ICD-10-CM

## 2014-03-02 DIAGNOSIS — Z95810 Presence of automatic (implantable) cardiac defibrillator: Secondary | ICD-10-CM

## 2014-03-02 DIAGNOSIS — E870 Hyperosmolality and hypernatremia: Secondary | ICD-10-CM

## 2014-03-02 DIAGNOSIS — N189 Chronic kidney disease, unspecified: Secondary | ICD-10-CM | POA: Diagnosis present

## 2014-03-02 DIAGNOSIS — Z951 Presence of aortocoronary bypass graft: Secondary | ICD-10-CM

## 2014-03-02 DIAGNOSIS — I10 Essential (primary) hypertension: Secondary | ICD-10-CM

## 2014-03-02 DIAGNOSIS — S68118A Complete traumatic metacarpophalangeal amputation of other finger, initial encounter: Secondary | ICD-10-CM

## 2014-03-02 DIAGNOSIS — I2510 Atherosclerotic heart disease of native coronary artery without angina pectoris: Secondary | ICD-10-CM | POA: Diagnosis present

## 2014-03-02 DIAGNOSIS — I798 Other disorders of arteries, arterioles and capillaries in diseases classified elsewhere: Secondary | ICD-10-CM | POA: Diagnosis present

## 2014-03-02 DIAGNOSIS — L03119 Cellulitis of unspecified part of limb: Secondary | ICD-10-CM

## 2014-03-02 DIAGNOSIS — G92 Toxic encephalopathy: Secondary | ICD-10-CM | POA: Diagnosis present

## 2014-03-02 DIAGNOSIS — Z515 Encounter for palliative care: Secondary | ICD-10-CM

## 2014-03-02 DIAGNOSIS — J4489 Other specified chronic obstructive pulmonary disease: Secondary | ICD-10-CM | POA: Diagnosis present

## 2014-03-02 DIAGNOSIS — G9341 Metabolic encephalopathy: Secondary | ICD-10-CM

## 2014-03-02 DIAGNOSIS — R7989 Other specified abnormal findings of blood chemistry: Secondary | ICD-10-CM

## 2014-03-02 DIAGNOSIS — G929 Unspecified toxic encephalopathy: Secondary | ICD-10-CM | POA: Diagnosis present

## 2014-03-02 DIAGNOSIS — J449 Chronic obstructive pulmonary disease, unspecified: Secondary | ICD-10-CM | POA: Diagnosis present

## 2014-03-02 DIAGNOSIS — I2589 Other forms of chronic ischemic heart disease: Secondary | ICD-10-CM | POA: Diagnosis present

## 2014-03-02 DIAGNOSIS — I509 Heart failure, unspecified: Secondary | ICD-10-CM | POA: Diagnosis present

## 2014-03-02 DIAGNOSIS — J9601 Acute respiratory failure with hypoxia: Secondary | ICD-10-CM | POA: Diagnosis present

## 2014-03-02 DIAGNOSIS — I129 Hypertensive chronic kidney disease with stage 1 through stage 4 chronic kidney disease, or unspecified chronic kidney disease: Secondary | ICD-10-CM | POA: Diagnosis present

## 2014-03-02 DIAGNOSIS — L02419 Cutaneous abscess of limb, unspecified: Secondary | ICD-10-CM | POA: Diagnosis not present

## 2014-03-02 DIAGNOSIS — R339 Retention of urine, unspecified: Secondary | ICD-10-CM

## 2014-03-02 DIAGNOSIS — R000 Tachycardia, unspecified: Secondary | ICD-10-CM

## 2014-03-02 DIAGNOSIS — B965 Pseudomonas (aeruginosa) (mallei) (pseudomallei) as the cause of diseases classified elsewhere: Secondary | ICD-10-CM | POA: Diagnosis present

## 2014-03-02 DIAGNOSIS — N179 Acute kidney failure, unspecified: Secondary | ICD-10-CM

## 2014-03-02 DIAGNOSIS — R6521 Severe sepsis with septic shock: Secondary | ICD-10-CM

## 2014-03-02 DIAGNOSIS — Z66 Do not resuscitate: Secondary | ICD-10-CM

## 2014-03-02 DIAGNOSIS — R627 Adult failure to thrive: Secondary | ICD-10-CM | POA: Diagnosis present

## 2014-03-02 DIAGNOSIS — N390 Urinary tract infection, site not specified: Secondary | ICD-10-CM | POA: Diagnosis present

## 2014-03-02 DIAGNOSIS — Z8673 Personal history of transient ischemic attack (TIA), and cerebral infarction without residual deficits: Secondary | ICD-10-CM

## 2014-03-02 DIAGNOSIS — E1165 Type 2 diabetes mellitus with hyperglycemia: Secondary | ICD-10-CM | POA: Diagnosis present

## 2014-03-02 DIAGNOSIS — Z86718 Personal history of other venous thrombosis and embolism: Secondary | ICD-10-CM

## 2014-03-02 DIAGNOSIS — N35919 Unspecified urethral stricture, male, unspecified site: Secondary | ICD-10-CM | POA: Diagnosis present

## 2014-03-02 DIAGNOSIS — E874 Mixed disorder of acid-base balance: Secondary | ICD-10-CM | POA: Diagnosis not present

## 2014-03-02 DIAGNOSIS — M6282 Rhabdomyolysis: Secondary | ICD-10-CM | POA: Diagnosis not present

## 2014-03-02 DIAGNOSIS — S78119A Complete traumatic amputation at level between unspecified hip and knee, initial encounter: Secondary | ICD-10-CM

## 2014-03-02 DIAGNOSIS — E785 Hyperlipidemia, unspecified: Secondary | ICD-10-CM | POA: Diagnosis present

## 2014-03-02 DIAGNOSIS — E1159 Type 2 diabetes mellitus with other circulatory complications: Secondary | ICD-10-CM

## 2014-03-02 DIAGNOSIS — I5022 Chronic systolic (congestive) heart failure: Secondary | ICD-10-CM | POA: Diagnosis present

## 2014-03-02 DIAGNOSIS — A419 Sepsis, unspecified organism: Principal | ICD-10-CM

## 2014-03-02 DIAGNOSIS — R6520 Severe sepsis without septic shock: Secondary | ICD-10-CM

## 2014-03-02 DIAGNOSIS — IMO0002 Reserved for concepts with insufficient information to code with codable children: Secondary | ICD-10-CM | POA: Diagnosis present

## 2014-03-02 DIAGNOSIS — R338 Other retention of urine: Secondary | ICD-10-CM

## 2014-03-02 LAB — COMPREHENSIVE METABOLIC PANEL
ALT: 902 U/L — ABNORMAL HIGH (ref 0–53)
AST: 1190 U/L — ABNORMAL HIGH (ref 0–37)
Albumin: 2.8 g/dL — ABNORMAL LOW (ref 3.5–5.2)
Alkaline Phosphatase: 189 U/L — ABNORMAL HIGH (ref 39–117)
BUN: 49 mg/dL — ABNORMAL HIGH (ref 6–23)
CO2: 23 mEq/L (ref 19–32)
Calcium: 8.1 mg/dL — ABNORMAL LOW (ref 8.4–10.5)
Chloride: 104 mEq/L (ref 96–112)
Creatinine, Ser: 2.43 mg/dL — ABNORMAL HIGH (ref 0.50–1.35)
GFR calc Af Amer: 28 mL/min — ABNORMAL LOW (ref >90)
GFR calc non Af Amer: 24 mL/min — ABNORMAL LOW (ref >90)
Glucose, Bld: 175 mg/dL — ABNORMAL HIGH (ref 70–99)
Potassium: 3.9 mEq/L (ref 3.7–5.3)
Sodium: 142 mEq/L (ref 137–147)
Total Bilirubin: 1.5 mg/dL — ABNORMAL HIGH (ref 0.3–1.2)
Total Protein: 6.5 g/dL (ref 6.0–8.3)

## 2014-03-02 LAB — CBC WITH DIFFERENTIAL/PLATELET
Basophils Absolute: 0 10*3/uL (ref 0.0–0.1)
Basophils Relative: 0 % (ref 0–1)
Eosinophils Absolute: 0 10*3/uL (ref 0.0–0.7)
Eosinophils Relative: 0 % (ref 0–5)
HCT: 39.9 % (ref 39.0–52.0)
Hemoglobin: 13 g/dL (ref 13.0–17.0)
Lymphocytes Relative: 11 % — ABNORMAL LOW (ref 12–46)
Lymphs Abs: 1.3 10*3/uL (ref 0.7–4.0)
MCH: 27.4 pg (ref 26.0–34.0)
MCHC: 32.6 g/dL (ref 30.0–36.0)
MCV: 84.2 fL (ref 78.0–100.0)
Monocytes Absolute: 1.3 10*3/uL — ABNORMAL HIGH (ref 0.1–1.0)
Monocytes Relative: 10 % (ref 3–12)
Neutro Abs: 9.7 10*3/uL — ABNORMAL HIGH (ref 1.7–7.7)
Neutrophils Relative %: 79 % — ABNORMAL HIGH (ref 43–77)
Platelets: 145 10*3/uL — ABNORMAL LOW (ref 150–400)
RBC: 4.74 MIL/uL (ref 4.22–5.81)
RDW: 16.5 % — ABNORMAL HIGH (ref 11.5–15.5)
WBC: 12.3 10*3/uL — ABNORMAL HIGH (ref 4.0–10.5)

## 2014-03-02 LAB — URINALYSIS, ROUTINE W REFLEX MICROSCOPIC
Bilirubin Urine: NEGATIVE
Glucose, UA: 100 mg/dL — AB
Ketones, ur: NEGATIVE mg/dL
Nitrite: NEGATIVE
Protein, ur: >300 mg/dL — AB
Specific Gravity, Urine: 1.022 (ref 1.005–1.030)
Urobilinogen, UA: 0.2 mg/dL (ref 0.0–1.0)
pH: 6 (ref 5.0–8.0)

## 2014-03-02 LAB — URINE MICROSCOPIC-ADD ON

## 2014-03-02 LAB — I-STAT CG4 LACTIC ACID, ED: Lactic Acid, Venous: 2.56 mmol/L — ABNORMAL HIGH (ref 0.5–2.2)

## 2014-03-02 LAB — TROPONIN I: Troponin I: <0.3 ng/mL (ref <0.30)

## 2014-03-02 LAB — MAGNESIUM: Magnesium: 2.8 mg/dL — ABNORMAL HIGH (ref 1.5–2.5)

## 2014-03-02 LAB — LIPASE, BLOOD: Lipase: 118 U/L — ABNORMAL HIGH (ref 11–59)

## 2014-03-02 MED ORDER — SODIUM CHLORIDE 0.9 % IV BOLUS (SEPSIS)
1000.0000 mL | Freq: Once | INTRAVENOUS | Status: AC
  Administered 2014-03-02: 1000 mL via INTRAVENOUS

## 2014-03-02 MED ORDER — ONDANSETRON HCL 4 MG/2ML IJ SOLN
4.0000 mg | Freq: Once | INTRAMUSCULAR | Status: AC
  Administered 2014-03-02: 4 mg via INTRAVENOUS
  Filled 2014-03-02: qty 2

## 2014-03-02 MED ORDER — LIDOCAINE HCL 2 % EX GEL
Freq: Once | CUTANEOUS | Status: AC
  Administered 2014-03-02: 21:00:00 via URETHRAL
  Filled 2014-03-02: qty 20

## 2014-03-02 MED ORDER — PIPERACILLIN-TAZOBACTAM 3.375 G IVPB 30 MIN
3.3750 g | Freq: Once | INTRAVENOUS | Status: AC
  Administered 2014-03-02: 3.375 g via INTRAVENOUS
  Filled 2014-03-02: qty 50

## 2014-03-02 MED ORDER — VANCOMYCIN HCL IN DEXTROSE 1-5 GM/200ML-% IV SOLN
1000.0000 mg | INTRAVENOUS | Status: DC
  Administered 2014-03-04: 1000 mg via INTRAVENOUS
  Filled 2014-03-02 (×2): qty 200

## 2014-03-02 MED ORDER — FENTANYL CITRATE 0.05 MG/ML IJ SOLN
50.0000 ug | Freq: Once | INTRAMUSCULAR | Status: AC
  Administered 2014-03-02: 50 ug via INTRAVENOUS
  Filled 2014-03-02: qty 2

## 2014-03-02 MED ORDER — LIDOCAINE HCL (CARDIAC) 20 MG/ML IV SOLN
INTRAVENOUS | Status: AC
  Filled 2014-03-02: qty 5

## 2014-03-02 MED ORDER — FENTANYL CITRATE 0.05 MG/ML IJ SOLN
50.0000 ug | Freq: Once | INTRAMUSCULAR | Status: AC
  Administered 2014-03-02: 50 ug via INTRAVENOUS
  Filled 2014-03-02 (×2): qty 2

## 2014-03-02 MED ORDER — SODIUM CHLORIDE 0.9 % IV SOLN
INTRAVENOUS | Status: DC
  Administered 2014-03-02 – 2014-03-03 (×2): via INTRAVENOUS

## 2014-03-02 MED ORDER — VANCOMYCIN HCL 10 G IV SOLR
1250.0000 mg | Freq: Once | INTRAVENOUS | Status: AC
  Administered 2014-03-02: 1250 mg via INTRAVENOUS
  Filled 2014-03-02: qty 1250

## 2014-03-02 MED ORDER — FENTANYL CITRATE 0.05 MG/ML IJ SOLN
25.0000 ug | Freq: Once | INTRAMUSCULAR | Status: AC
  Administered 2014-03-02: 25 ug via INTRAVENOUS
  Filled 2014-03-02: qty 2

## 2014-03-02 NOTE — ED Notes (Addendum)
Per EMS, [facility]. Family requested pt to be brought in. Pt has had decreased LOC since 02/21/14. Pt alert and responds to voice and pain. Pt given 50cc Nacl SQ through abdomen. Pt BP has been low. Pt usually is up in wheelchair. Pt has not been eating or hardly moving per rehab facility.  Irregular breathing noted. VS- BP 98/62, T 98.1, R 16, P68, 96% on 2L, cbg 199. Pt c/o abdominal pain. Foley catheter placed 1345. Family concerned about pt. Family at bedside.

## 2014-03-02 NOTE — ED Notes (Signed)
Dr. Juleen ChinaKohut with this RN attempted 18 fr. Coude foley insertion per Urologist without success. Urologist informed sts will attempt here in ED

## 2014-03-02 NOTE — ED Notes (Signed)
Patient transported to X-ray 

## 2014-03-02 NOTE — ED Notes (Signed)
[place] called sts pt unable to lay still, and 1,163m visualized in bladder and no catheter tip of balloon visualized. RN went to [place] catheter not kinked,  Unable to flush, push in or get  Balloon to deflate. Attempted x2 with 10cc syringes appx 1cc removed. RN attempted to slowly pull catheter out, catheter pulled out slightly but met minimal resistance. When pulled out 3-5cc visualized in balloon. Penis bleeding RN placed gauze and pressure to shaft and tip of penis bleeding controlled. Pt brought back to ED. Dr. KWilson Singerassessed patient. Requesting RN to insert foley.

## 2014-03-02 NOTE — Consult Note (Signed)
I have been asked to see the patient by Dr. Raeford RazorStephen Kohut, MD, for evaluation and management of difficult foley.  History of present illness: 78 year old male presented to the emergency department from skilled nursing facility with failure to thrive. At the nursing facility the staff had attempted to pass a Foley catheter. The catheter was within the urethra and there was some urine in the bag. However, the patient underwent abdominal wall chosen to have over a liter in his bladder. Foley catheter was then removed and attempted to place another Foley catheter in the ER was unsuccessfully. As such urology was consult.  History is very limited due to the patient's altered mental status. Per chart review, the patient appears to have no history of voiding trouble. He has no history of urinary retention. His a history of seeing a urologist in the past. Has no past GU surgical history.  Review of systems: A 12 point comprehensive review of systems Limited to the patient's mental status.  Patient Active Problem List   Diagnosis Date Noted  . Cardiomyopathy, ischemic 12/01/2013  . Acute on chronic systolic CHF (congestive heart failure) 12/01/2013  . ICD (implantable cardioverter-defibrillator) in place 12/01/2013  . Hypokalemia 12/01/2013  . Carotid stenosis, symptomatic, with infarction 11/28/2013  . Bradycardia 11/28/2013  . Hypoxia 11/28/2013  . CVA (cerebral vascular accident) 03/20/2013  . Dyslipidemia 02/06/2013  . Type II or unspecified type diabetes mellitus with peripheral circulatory disorders, uncontrolled(250.72) 02/06/2013  . Essential hypertension, benign 02/06/2013  . CAD (coronary artery disease) 02/06/2013  . PVD (peripheral vascular disease) 02/06/2013    Current Facility-Administered Medications on File Prior to Encounter  Medication Dose Route Frequency Provider Last Rate Last Dose  . lactated ringers infusion   Intravenous Continuous Kerby Noraavid A Crews, MD       Current  Outpatient Prescriptions on File Prior to Encounter  Medication Sig Dispense Refill  . aspirin 81 MG chewable tablet Chew 81 mg by mouth daily.      . bisacodyl (DULCOLAX) 10 MG suppository Place 10 mg rectally daily as needed for mild constipation or moderate constipation.      . carvedilol (COREG) 3.125 MG tablet Take 1 tablet (3.125 mg total) by mouth 2 (two) times daily with a meal.      . Cholecalciferol (VITAMIN D3) 50000 UNITS CAPS Take 50,000 Units by mouth every 30 (thirty) days. On the 15th of every month      . clopidogrel (PLAVIX) 75 MG tablet Take 75 mg by mouth daily.      . furosemide (LASIX) 20 MG tablet Take 20 mg by mouth daily.       . Magnesium Hydroxide (MILK OF MAGNESIA PO) Take 30 mLs by mouth daily as needed (constipation).      . Multiple Vitamins-Minerals (MULTIVITAMIN WITH MINERALS) tablet Take 1 tablet by mouth daily.      . protective barrier (RESTORE) CREA Apply 1 application topically 3 (three) times daily as needed (to perlarea once a shift and after each incontinent episode).      . ranolazine (RANEXA) 500 MG 12 hr tablet Take 500 mg by mouth 2 (two) times daily.        Past Medical History  Diagnosis Date  . CAD (coronary artery disease)     a. s/p CABG 1998.  . Subdural hematoma     a. TBI 2011.  . 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 Cardiac defibrillator in place   .  Chronic systolic CHF (congestive heart failure)     a. ICM.  Marland Kitchen. Diabetes mellitus without complication   . Hypertension   . Hyperlipemia   . Peripheral vascular disease   . COPD (chronic obstructive pulmonary disease)   . VT (ventricular tachycardia)     a. s/p Medtronic ICD 2007, nearing ERI but patient had declined ERI changeout.  . Stroke 1998    no residual  . Automatic implantable cardioverter-defibrillator in situ   . Seizures     last one maybe 2012  . History of blood transfusion   . DVT (deep venous thrombosis)   . Amputation finger     left 3rd  tramatic- at work    Past Surgical History  Procedure Laterality Date  . Leg amputation above knee      right  . Leg amputation below knee      left  . Coronary artery bypass graft  2004  . Cardiac defibrillator placement  2007  . Balloon angioplasty, artery      rt lower leg  . Carotid endarterectomy      bilateral  . Amputation  06/28/2012    Procedure: AMPUTATION DIGIT;  Surgeon: Tami RibasKevin R Kuzma, MD;  [facility];  Service: Orthopedics;  Laterality: Left;  left  distal index finger  . Eye surgery Bilateral   . Radiology with anesthesia N/A 11/28/2013    Procedure: ANGIO WITH CAROTID STENT ;  Surgeon: Oneal GroutSanjeev K Deveshwar, MD;  Location: [facility] OR;  Service: Radiology;  Laterality: N/A;    History  Substance Use Topics  . Smoking status: Former Smoker    Types: Cigars  . Smokeless tobacco: Not on file  . Alcohol Use: No    History reviewed. No pertinent family history.  PE: Filed Vitals:   02/28/2014 1845 02/04/2014 2045 02/28/2014 2100 02/15/2014 2130  BP: 103/61 93/57 90/73  116/78  Pulse: 77 78 79 79  Temp:      TempSrc:      Resp: 21 19 25 23   SpO2: 100% 100% 99% 100%   Patient lying in bed noncommunicative patient is alert and oriented x one Atraumatic normocephalic head No cervical or supraclavicular lymphadenopathy appreciated No increased work of breathing, no audible wheezes/rhonchi Regular sinus rhythm/rate per telemetry Abdomen soft, positive suprapubic tenderness, bladder was not palpable. Normal uncircumcised appearing male phallus with ortopic urethral meatus. No identifiable skin lesions   Recent Labs  03/01/2014 1721  WBC 12.3*  HGB 13.0  HCT 39.9    Recent Labs  02/17/2014 1721  NA 142  K 3.9  CL 104  CO2 23  GLUCOSE 175*  BUN 49*  CREATININE 2.43*  CALCIUM 8.1*   No results found for this basename: LABPT, INR,  in the last 72 hours No results found for this basename: LABURIN,  in the last 72 hours Results for orders placed during the  hospital encounter of 11/28/13  MRSA PCR SCREENING     Status: Abnormal   Collection Time    11/28/13  2:22 PM      Result Value Ref Range Status   MRSA by PCR POSITIVE (*) NEGATIVE Final   Comment:            The GeneXpert MRSA Assay (FDA     approved for NASAL specimens     only), is one component of a     comprehensive MRSA colonization     surveillance program. It is not     intended to diagnose MRSA  infection nor to guide or     monitor treatment for     MRSA infections.     RESULT CALLED TO, READ BACK BY AND VERIFIED WITH:     A. [facility] RN 16:35 11/28/13 ([email or web address])  URINE CULTURE     Status: None   Collection Time    12/02/13 12:17 PM      Result Value Ref Range Status   Specimen Description URINE, CLEAN CATCH   Final   Special Requests NONE   Final   Culture  Setup Time     Final   Value: 12/02/2013 17:43     Performed at [facility] Count     Final   Value: >=100,000 COLONIES/ML     Performed at [facility]   Culture     Final   Value: PSEUDOMONAS AERUGINOSA     Performed at [facility]   Report Status 12/04/2013 FINAL   Final   Organism ID, Bacteria PSEUDOMONAS AERUGINOSA   Final    Imaging: Bladder scan was performed ultrasound prior to performing a ureteral catheter placement and measured greater than 999 mL.  Procedure: Please see procedure note -ultimately, the patient had a urethral stricture which was dilated and a 16 [place] council tip Foley catheter was passed.  Imp: Patient with a long, dense bulbar urethral stricture which was dilated in the emergency department and a Foley catheter placed over a wire.  Recommendations: I would recommend the patient continue with his Foley catheter for at least one week prior to removing it. The patient is been given antibiotics in the emergency department, however, a urine culture was sent from the initial catheterization.  Thank you for involving me in this patient's care,I will  continue to follow along from a far.  Please page with any further questions or concerns. Berniece Salines W

## 2014-03-02 NOTE — ED Notes (Signed)
Family at bedside. 

## 2014-03-02 NOTE — ED Notes (Signed)
NOTIFIED DR. Juleen ChinaKOHUT IN PERSON FOR PATIENTS LAB RESULTS OF CG4+ LACTIC ACID ,@17 :42 PM ,12/12/2013.

## 2014-03-02 NOTE — ED Notes (Signed)
MD at bedside. 

## 2014-03-02 NOTE — ED Provider Notes (Signed)
CSN: [id number]     Arrival date & time 02/27/2014  1618 History   First MD Initiated Contact with Patient 02/09/2014 1622     Chief Complaint  Patient presents with  . Failure To Thrive     (Consider location/radiation/quality/duration/timing/severity/associated sxs/prior Treatment) HPI  78 year old male coming from nursing facility at the request of family. Past medical history of CAD s/p CABG, Subdural hematoma; Amputation of legs; Chronic systolic CHF ; Diabetes mellitus; Hypertension; Hyperlipemia; Peripheral vascular disease; COPD ; VT); Stroke (1998); Automatic implantable cardioverter-defibrillator in situ; Seizures; DVT.   Most of history is from daughters at bedside review of records. They report for the past 2 weeks patient has had a slow, continual decline. Normally awake, talking and they report "lucid." He is now to the point where he almost nonverbal or at the very least very difficult to understand when he does talk. Sleeping all day. He appears that he has no energy.  Daughters report that he is currently being treated for infection, but they're not sure what specifically. It is not clear either from scant records accompaning him.  Had recent admission in March after was found to have a left ICA stenosis and had a stent assisted angioplasty. Hospital stay complication by CHF exacerbation.  Daughters confirm pt is DNR.    Past Medical History  Diagnosis Date  . CAD (coronary artery disease)     a. s/p CABG 1998.  . Subdural hematoma     a. TBI 2011.  . 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 Cardiac defibrillator in place   . Chronic systolic CHF (congestive heart failure)     a. ICM.  Marland Kitchen Diabetes mellitus without complication   . Hypertension   . Hyperlipemia   . Peripheral vascular disease   . COPD (chronic obstructive pulmonary disease)   . VT (ventricular tachycardia)     a. s/p Medtronic ICD 2007, nearing ERI but patient had declined ERI  changeout.  . Stroke 1998    no residual  . Automatic implantable cardioverter-defibrillator in situ   . Seizures     last one maybe 2012  . History of blood transfusion   . DVT (deep venous thrombosis)   . 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 Leg amputation above knee      right  . Leg amputation below knee      left  . Coronary artery bypass graft  2004  . Cardiac defibrillator placement  2007  . Balloon angioplasty, artery      rt lower leg  . Carotid endarterectomy      bilateral  . Amputation  06/28/2012    Procedure: AMPUTATION DIGIT;  Surgeon: Tami Ribas, MD;  Location: [facility] OR;  Service: Orthopedics;  Laterality: Left;  left  distal index finger  . Eye surgery Bilateral   . Radiology with anesthesia N/A 11/28/2013    Procedure: ANGIO WITH CAROTID STENT ;  Surgeon: Oneal Grout, MD;  Location: [facility] OR;  Service: Radiology;  Laterality: N/A;   History reviewed. No pertinent family history. History  Substance Use Topics  . Smoking status: Former Smoker    Types: Cigars  . Smokeless tobacco: Not on file  . Alcohol Use: No    Review of Systems  Level 5 caveat because pt is very drowsy and unable to provide much useful  history.   Allergies  Review of patient's allergies indicates no known allergies.  Home Medications   Prior to Admission medications   Medication Sig Start Date End Date Taking? Authorizing Provider  aspirin 81 MG chewable tablet Chew 81 mg by mouth daily.   Yes Historical Provider, MD  bisacodyl (DULCOLAX) 10 MG suppository Place 10 mg rectally daily as needed for mild constipation or moderate constipation.   Yes Historical Provider, MD  carvedilol (COREG) 3.125 MG tablet Take 1 tablet (3.125 mg total) by mouth 2 (two) times daily with a meal. 12/05/13  Yes Calvert CantorSaima Rizwan, MD  Cholecalciferol (VITAMIN D3) 50000 UNITS CAPS Take 50,000 Units by mouth every 30 (thirty) days. On the  15th of every month   Yes Historical Provider, MD  clopidogrel (PLAVIX) 75 MG tablet Take 75 mg by mouth daily.   Yes Historical Provider, MD  enalapril (VASOTEC) 5 MG tablet Take 5 mg by mouth daily.   Yes Historical Provider, MD  escitalopram (LEXAPRO) 5 MG tablet Take 5 mg by mouth daily.   Yes Historical Provider, MD  furosemide (LASIX) 20 MG tablet Take 20 mg by mouth daily.  12/20/13  Yes Calvert CantorSaima Rizwan, MD  glipiZIDE (GLUCOTROL XL) 5 MG 24 hr tablet Take 5 mg by mouth daily with breakfast.   Yes Historical Provider, MD  insulin aspart (NOVOLOG FLEXPEN) 100 UNIT/ML FlexPen Inject 2-10 Units into the skin 4 (four) times daily -  before meals and at bedtime. Per sliding scale   Yes Historical Provider, MD  ipratropium-albuterol (DUONEB) 0.5-2.5 (3) MG/3ML SOLN Take 3 mLs by nebulization every 6 (six) hours as needed (for wheezing or shortness of breath).   Yes Historical Provider, MD  Magnesium Hydroxide (MILK OF MAGNESIA PO) Take 30 mLs by mouth daily as needed (constipation).   Yes Historical Provider, MD  Multiple Vitamins-Minerals (MULTIVITAMIN WITH MINERALS) tablet Take 1 tablet by mouth daily.   Yes Historical Provider, MD  nitroGLYCERIN (NITROSTAT) 0.4 MG SL tablet Place 0.4 mg under the tongue every 5 (five) minutes as needed for chest pain.   Yes Historical Provider, MD  potassium chloride (K-DUR,KLOR-CON) 10 MEQ tablet Take 10 mEq by mouth daily.   Yes Historical Provider, MD  protective barrier (RESTORE) CREA Apply 1 application topically 3 (three) times daily as needed (to perlarea once a shift and after each incontinent episode).   Yes Historical Provider, MD  ranolazine (RANEXA) 500 MG 12 hr tablet Take 500 mg by mouth 2 (two) times daily.   Yes Historical Provider, MD  rosuvastatin (CRESTOR) 20 MG tablet Take 20 mg by mouth at bedtime.   Yes Historical Provider, MD   BP 92/49  Pulse 80  Temp(Src) 98.1 F (36.7 C) (Rectal)  Resp 16  SpO2 97% Physical Exam  Nursing note and  vitals reviewed. Constitutional:  Laying in bed. Appears to be sleeping. Chronically ill appearing.   HENT:  Head: Normocephalic and atraumatic.  Dry mucus membranes  Eyes: Conjunctivae are normal. Pupils are equal, round, and reactive to light. Right eye exhibits no discharge. Left eye exhibits no discharge.  Neck: Neck supple.  Cardiovascular: Normal rate, regular rhythm and normal heart sounds.  Exam reveals no gallop and no friction rub.   No murmur heard. Pulmonary/Chest: Effort normal and breath sounds normal. No respiratory distress.  Abdominal: Soft. He exhibits no distension. There is tenderness. There is no rebound and no guarding.  Diffuse abdominal tenderness. No guarding. No rebound.   Genitourinary:  foley  Musculoskeletal: He  exhibits no edema and no tenderness.  L finger amps. L BKA. R AKA. Scattered abrasions to b/l thighs.   Neurological:  Very drowsy. Does verbalize, but difficult to understand most of what is being said. Follows simple commands such as squeeze fingers with both hands. Diminished strength b/l UE but symmetric. No facial droop. Doesn't participate enough to fully assess CN.     ED Course  Procedures (including critical care time) Labs Review Labs Reviewed  URINALYSIS, ROUTINE W REFLEX MICROSCOPIC - Abnormal; Notable for the following:    Color, Urine AMBER (*)    APPearance CLOUDY (*)    Glucose, UA 100 (*)    Hgb urine dipstick LARGE (*)    Protein, ur >300 (*)    Leukocytes, UA TRACE (*)    All other components within normal limits  MAGNESIUM - Abnormal; Notable for the following:    Magnesium 2.8 (*)    All other components within normal limits  CBC WITH DIFFERENTIAL - Abnormal; Notable for the following:    WBC 12.3 (*)    RDW 16.5 (*)    Platelets 145 (*)    Neutrophils Relative % 79 (*)    Neutro Abs 9.7 (*)    Lymphocytes Relative 11 (*)    Monocytes Absolute 1.3 (*)    All other components within normal limits  COMPREHENSIVE  METABOLIC PANEL - Abnormal; Notable for the following:    Glucose, Bld 175 (*)    BUN 49 (*)    Creatinine, Ser 2.43 (*)    Calcium 8.1 (*)    Albumin 2.8 (*)    AST 1190 (*)    ALT 902 (*)    Alkaline Phosphatase 189 (*)    Total Bilirubin 1.5 (*)    GFR calc non Af Amer 24 (*)    GFR calc Af Amer 28 (*)    All other components within normal limits  LIPASE, BLOOD - Abnormal; Notable for the following:    Lipase 118 (*)    All other components within normal limits  URINE MICROSCOPIC-ADD ON - Abnormal; Notable for the following:    Squamous Epithelial / LPF FEW (*)    Casts GRANULAR CAST (*)    All other components within normal limits  I-STAT CG4 LACTIC ACID, ED - Abnormal; Notable for the following:    Lactic Acid, Venous 2.56 (*)    All other components within normal limits  URINE CULTURE  CULTURE, BLOOD (ROUTINE X 2)  CULTURE, BLOOD (ROUTINE X 2)  TROPONIN I    Imaging Review Ct Head Wo Contrast  02/23/2014   CLINICAL DATA:  Altered mental status.  EXAM: CT HEAD WITHOUT CONTRAST  TECHNIQUE: Contiguous axial images were obtained from the base of the skull through the vertex without intravenous contrast.  COMPARISON:  CT 09/23/2013 ; 05/21/2013.  FINDINGS: Motion artifact limits evaluation.  Ventricles and sulci are prominent for patient's age compatible with cerebral atrophy. Extensive periventricular and subcortical white matter hypodensity compatible with chronic small vessel ischemic change. Right occipital lobe encephalomalacia, unchanged. Re- demonstrated bilateral basal ganglia lacunar infarcts. No evidence for acute cortically based infarct. No evidence for intracranial hematoma, mass effect or mass lesion.  IMPRESSION: Motion artifact limits evaluation.  No acute intracranial process.  Chronic small vessel ischemic change.  Right occipital encephalomalacia.   Electronically Signed   By: Annia Beltrew  Davis M.D.   On: 02/11/2014 18:18   Dg Chest Portable 1 View  02/04/2014    CLINICAL DATA:  Altered  mental status.  Subdural hematoma.  CHF.  EXAM: PORTABLE CHEST - 1 VIEW  COMPARISON:  11/30/2013.  FINDINGS: LEFT subclavian AICD leads appear unchanged. Median sternotomy. Unchanged broken sternal wires. Cardiomegaly is present with pulmonary vascular congestion and interstitial pulmonary edema. Basilar atelectasis. No focal consolidation to suggest pneumonia. Monitoring leads project over the chest.  IMPRESSION: Mild CHF.   Electronically Signed   By: Andreas Newport M.D.   On: 02/17/2014 18:26     EKG Interpretation None      EKG:  Rhythm: sinus with PVC Rate: 77 PR: 245 ms QRS: 138 ms QTc: 517 ms RBBB ST segments: NS ST changes No significant change since last tracing   MDM   Final diagnoses:  Altered mental status, unspecified altered mental status type  Acute renal failure, unspecified acute renal failure type  Urinary retention  Abnormal LFTs    58y male with decreased mental status. Wide differential. Empirically cover for possible sepsis given hypotension, change in mental status and leukocytosis. He does have history congestive heart failure, but with his hypotension and acute renal failure he was given IV fluids. Significant elevation in LFTs as well as bump in lipase. His abdomen is diffusely tender. Consider shock liver. Consider cholecystitis. We'll ultrasound to further evaluate. Chest x-ray is clear. Head CT without acute abnormality. Patient does follow commands. Very drowsy and globally weak. No  focal deficit noted.  Apparently he had his Foley catheter placed today. The rationale for this wasn't exactly clear to me. Daughters report that she does not normally have one. Will keep at this time to help assess I/Os.   7:54 PM Pt at [place] and apparently has 1,100 of urine in bladder. Arrived from NH with ~200c in bag. No additional urine since arrival. Could not flush. Discussed with nursing to replace.   8:22 PM Unable to place new catheter.  Attempted by two nurses. Blood at meatus. IVF off. Urology paged.   Raeford Razor, MD 03/04/14 [phone]

## 2014-03-02 NOTE — Procedures (Signed)
Pre-procedure diagnosis: difficult foley catheterization Post-procedure diagnosis: as above  Procedure performed: Attempted cystourethroscopy, urethral dilation, complex placement of a Foley catheter   Surgeon: Dr. Ardis Hughs  Findings: patient had a dense urethral stricture located in the bulbous urethra. I was able to get a wire through the stricture and then over the wire using Hegar ureteral dilator set and dilated up to 20 [place]. I then passed a 16 [place] council tip catheter over the wire.  Specimen: Urine for culture.  Drains: 66F council-tipped catheter   Indications: Please see urology consult note for specifics, patient failed to thrive in the nursing home and was found to be in urinary retention.     Procedure:  Gentials were prepped and draped in the routine sterile fashion.  10cc of 1% viscous lidocaine jelly was then injected into the patient's urethra.  A 36F coude tipped catheter was then gently passed in to the urethral, at the base of the penis in the bulbar urethral there was significant resistance met. I was unable to advance the catheter further. The remove the catheter in place the flexible cystoscope. Please see the above ureteral stricture findings. I was able to advance a sensor wire through the stricture and into the bladder. I then removed the cystoscope and dilated the urethra with Hegar dilators using a well anchored wire. I was able to get up to 20 [place], and then able to pass a 16 [place] council tip Foley catheter over the wire and into the patient's bladder. More than 1000 cc of urine drained. A urine culture was sent.   Patient tolerated the procedure well - no immediate issues.  Disposition:Would recommend continue with the Foley catheter for the next 7-10 days. This should be followed by a voiding trial. He should then follow up with urology in the next month or 2.

## 2014-03-02 NOTE — ED Notes (Signed)
Patient transported to CT 

## 2014-03-02 NOTE — Progress Notes (Signed)
ANTIBIOTIC CONSULT NOTE - INITIAL  Pharmacy Consult for Vancomycin Indication: rule out sepsis  No Known Allergies  Patient Measurements:  Weight; 69kg  Vital Signs: Temp: 98.1 F (36.7 C) (06/28 1632) Temp src: Rectal (06/28 1632) BP: 103/61 mmHg (06/28 1845) Pulse Rate: 77 (06/28 1845) Intake/Output from previous day:   Intake/Output from this shift:    Labs:  Recent Labs  May 03, 2014 1721  WBC 12.3*  HGB 13.0  PLT 145*  CREATININE 2.43*   The CrCl is unknown because both a height and weight (above a minimum accepted value) are required for this calculation. No results found for this basename: VANCOTROUGH, VANCOPEAK, VANCORANDOM, GENTTROUGH, GENTPEAK, GENTRANDOM, TOBRATROUGH, TOBRAPEAK, TOBRARND, AMIKACINPEAK, AMIKACINTROU, AMIKACIN,  in the last 72 hours   Microbiology: No results found for this or any previous visit (from the past 720 hour(s)).  Medical History: Past Medical History  Diagnosis Date  . CAD (coronary artery disease)     a. s/p CABG 1998.  . Subdural hematoma     a. TBI 2011.  . 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 Cardiac defibrillator in place   . Chronic systolic CHF (congestive heart failure)     a. ICM.  Marland Kitchen. Diabetes mellitus without complication   . Hypertension   . Hyperlipemia   . Peripheral vascular disease   . COPD (chronic obstructive pulmonary disease)   . VT (ventricular tachycardia)     a. s/p Medtronic ICD 2007, nearing ERI but patient had declined ERI changeout.  . Stroke 1998    no residual  . Automatic implantable cardioverter-defibrillator in situ   . Seizures     last one maybe 2012  . History of blood transfusion   . DVT (deep venous thrombosis)   . 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 Patient has also received Zosyn x 1. Patient is bilateral AKA and bedbound - calculated CrCl may not be accurate. -  Crcl ~20 ml/min - Wt 68kg  Goal of Therapy:  Vancomycin trough level 15-20 mcg/ml  Plan:  1. Vancomycin 1.25g IV x 1, then 1g IV q48h 2. Follow-up continuation of Zosyn 3. Monitor renal function, cultures, clinical course and [facility]  [place], [place] Robert [phone] 03/29/2014,6:56 PM

## 2014-03-02 NOTE — ED Notes (Signed)
Transporting patient to room assignment.

## 2014-03-02 NOTE — ED Notes (Signed)
This RN with Ashura RN attempted to insert foley catheter using appropriate peri-care and sterile technique resistance met mid-lower shaft and catheter can be felt kinking. RN removed catheter and 2 large clots dispersed as well. Per Wilson Singer MD attempted x2 Sharyn Lull RN and Phillis Haggis RN attempted foley catheter insertion without success. Dr. Wilson Singer made aware. Urology paged.

## 2014-03-03 ENCOUNTER — Emergency Department (HOSPITAL_COMMUNITY): Payer: PRIVATE HEALTH INSURANCE

## 2014-03-03 ENCOUNTER — Encounter (HOSPITAL_COMMUNITY): Payer: Self-pay | Admitting: Radiology

## 2014-03-03 ENCOUNTER — Inpatient Hospital Stay (HOSPITAL_COMMUNITY): Payer: PRIVATE HEALTH INSURANCE

## 2014-03-03 DIAGNOSIS — I2510 Atherosclerotic heart disease of native coronary artery without angina pectoris: Secondary | ICD-10-CM | POA: Diagnosis present

## 2014-03-03 DIAGNOSIS — Z8673 Personal history of transient ischemic attack (TIA), and cerebral infarction without residual deficits: Secondary | ICD-10-CM

## 2014-03-03 DIAGNOSIS — E876 Hypokalemia: Secondary | ICD-10-CM | POA: Diagnosis not present

## 2014-03-03 DIAGNOSIS — J9600 Acute respiratory failure, unspecified whether with hypoxia or hypercapnia: Secondary | ICD-10-CM

## 2014-03-03 DIAGNOSIS — D696 Thrombocytopenia, unspecified: Secondary | ICD-10-CM | POA: Diagnosis not present

## 2014-03-03 DIAGNOSIS — Z87891 Personal history of nicotine dependence: Secondary | ICD-10-CM | POA: Diagnosis not present

## 2014-03-03 DIAGNOSIS — R6520 Severe sepsis without septic shock: Secondary | ICD-10-CM

## 2014-03-03 DIAGNOSIS — J9601 Acute respiratory failure with hypoxia: Secondary | ICD-10-CM | POA: Diagnosis present

## 2014-03-03 DIAGNOSIS — R627 Adult failure to thrive: Secondary | ICD-10-CM | POA: Diagnosis present

## 2014-03-03 DIAGNOSIS — I129 Hypertensive chronic kidney disease with stage 1 through stage 4 chronic kidney disease, or unspecified chronic kidney disease: Secondary | ICD-10-CM | POA: Diagnosis present

## 2014-03-03 DIAGNOSIS — Z66 Do not resuscitate: Secondary | ICD-10-CM | POA: Diagnosis present

## 2014-03-03 DIAGNOSIS — S68118A Complete traumatic metacarpophalangeal amputation of other finger, initial encounter: Secondary | ICD-10-CM | POA: Diagnosis not present

## 2014-03-03 DIAGNOSIS — N179 Acute kidney failure, unspecified: Secondary | ICD-10-CM | POA: Diagnosis present

## 2014-03-03 DIAGNOSIS — A419 Sepsis, unspecified organism: Secondary | ICD-10-CM | POA: Diagnosis present

## 2014-03-03 DIAGNOSIS — I798 Other disorders of arteries, arterioles and capillaries in diseases classified elsewhere: Secondary | ICD-10-CM | POA: Diagnosis present

## 2014-03-03 DIAGNOSIS — E1159 Type 2 diabetes mellitus with other circulatory complications: Secondary | ICD-10-CM

## 2014-03-03 DIAGNOSIS — Z515 Encounter for palliative care: Secondary | ICD-10-CM | POA: Diagnosis not present

## 2014-03-03 DIAGNOSIS — I5022 Chronic systolic (congestive) heart failure: Secondary | ICD-10-CM | POA: Diagnosis present

## 2014-03-03 DIAGNOSIS — R339 Retention of urine, unspecified: Secondary | ICD-10-CM | POA: Diagnosis present

## 2014-03-03 DIAGNOSIS — E785 Hyperlipidemia, unspecified: Secondary | ICD-10-CM | POA: Diagnosis present

## 2014-03-03 DIAGNOSIS — S78119A Complete traumatic amputation at level between unspecified hip and knee, initial encounter: Secondary | ICD-10-CM | POA: Diagnosis not present

## 2014-03-03 DIAGNOSIS — K7200 Acute and subacute hepatic failure without coma: Secondary | ICD-10-CM

## 2014-03-03 DIAGNOSIS — I10 Essential (primary) hypertension: Secondary | ICD-10-CM

## 2014-03-03 DIAGNOSIS — Z95810 Presence of automatic (implantable) cardiac defibrillator: Secondary | ICD-10-CM | POA: Diagnosis not present

## 2014-03-03 DIAGNOSIS — Z794 Long term (current) use of insulin: Secondary | ICD-10-CM | POA: Diagnosis not present

## 2014-03-03 DIAGNOSIS — I2589 Other forms of chronic ischemic heart disease: Secondary | ICD-10-CM | POA: Diagnosis present

## 2014-03-03 DIAGNOSIS — S88119A Complete traumatic amputation at level between knee and ankle, unspecified lower leg, initial encounter: Secondary | ICD-10-CM | POA: Diagnosis not present

## 2014-03-03 DIAGNOSIS — L02419 Cutaneous abscess of limb, unspecified: Secondary | ICD-10-CM | POA: Diagnosis not present

## 2014-03-03 DIAGNOSIS — B965 Pseudomonas (aeruginosa) (mallei) (pseudomallei) as the cause of diseases classified elsewhere: Secondary | ICD-10-CM | POA: Diagnosis present

## 2014-03-03 DIAGNOSIS — E874 Mixed disorder of acid-base balance: Secondary | ICD-10-CM | POA: Diagnosis not present

## 2014-03-03 DIAGNOSIS — R4182 Altered mental status, unspecified: Secondary | ICD-10-CM

## 2014-03-03 DIAGNOSIS — Z951 Presence of aortocoronary bypass graft: Secondary | ICD-10-CM | POA: Diagnosis not present

## 2014-03-03 DIAGNOSIS — N189 Chronic kidney disease, unspecified: Secondary | ICD-10-CM | POA: Diagnosis present

## 2014-03-03 DIAGNOSIS — G92 Toxic encephalopathy: Secondary | ICD-10-CM | POA: Diagnosis present

## 2014-03-03 DIAGNOSIS — E1165 Type 2 diabetes mellitus with hyperglycemia: Secondary | ICD-10-CM | POA: Diagnosis present

## 2014-03-03 DIAGNOSIS — N35919 Unspecified urethral stricture, male, unspecified site: Secondary | ICD-10-CM | POA: Diagnosis present

## 2014-03-03 DIAGNOSIS — Z87820 Personal history of traumatic brain injury: Secondary | ICD-10-CM | POA: Diagnosis not present

## 2014-03-03 DIAGNOSIS — G9341 Metabolic encephalopathy: Secondary | ICD-10-CM | POA: Diagnosis present

## 2014-03-03 DIAGNOSIS — I509 Heart failure, unspecified: Secondary | ICD-10-CM | POA: Diagnosis present

## 2014-03-03 DIAGNOSIS — G929 Unspecified toxic encephalopathy: Secondary | ICD-10-CM | POA: Diagnosis present

## 2014-03-03 DIAGNOSIS — Z7982 Long term (current) use of aspirin: Secondary | ICD-10-CM | POA: Diagnosis not present

## 2014-03-03 DIAGNOSIS — E870 Hyperosmolality and hypernatremia: Secondary | ICD-10-CM | POA: Diagnosis not present

## 2014-03-03 DIAGNOSIS — R7989 Other specified abnormal findings of blood chemistry: Secondary | ICD-10-CM

## 2014-03-03 DIAGNOSIS — Z86718 Personal history of other venous thrombosis and embolism: Secondary | ICD-10-CM | POA: Diagnosis not present

## 2014-03-03 DIAGNOSIS — N390 Urinary tract infection, site not specified: Secondary | ICD-10-CM | POA: Diagnosis present

## 2014-03-03 DIAGNOSIS — R338 Other retention of urine: Secondary | ICD-10-CM | POA: Diagnosis present

## 2014-03-03 DIAGNOSIS — J449 Chronic obstructive pulmonary disease, unspecified: Secondary | ICD-10-CM | POA: Diagnosis present

## 2014-03-03 DIAGNOSIS — Z7902 Long term (current) use of antithrombotics/antiplatelets: Secondary | ICD-10-CM | POA: Diagnosis not present

## 2014-03-03 DIAGNOSIS — IMO0002 Reserved for concepts with insufficient information to code with codable children: Secondary | ICD-10-CM | POA: Diagnosis present

## 2014-03-03 DIAGNOSIS — R6521 Severe sepsis with septic shock: Secondary | ICD-10-CM

## 2014-03-03 DIAGNOSIS — M6282 Rhabdomyolysis: Secondary | ICD-10-CM | POA: Diagnosis not present

## 2014-03-03 LAB — CBC
HEMATOCRIT: 37.6 % — AB (ref 39.0–52.0)
HEMOGLOBIN: 12.2 g/dL — AB (ref 13.0–17.0)
MCH: 27.4 pg (ref 26.0–34.0)
MCHC: 32.4 g/dL (ref 30.0–36.0)
MCV: 84.3 fL (ref 78.0–100.0)
Platelets: 139 10*3/uL — ABNORMAL LOW (ref 150–400)
RBC: 4.46 MIL/uL (ref 4.22–5.81)
RDW: 16.8 % — AB (ref 11.5–15.5)
WBC: 18.1 10*3/uL — ABNORMAL HIGH (ref 4.0–10.5)

## 2014-03-03 LAB — CK: CK TOTAL: 680 U/L — AB (ref 7–232)

## 2014-03-03 LAB — BLOOD GAS, ARTERIAL
Acid-base deficit: 6.1 mmol/L — ABNORMAL HIGH (ref 0.0–2.0)
BICARBONATE: 17.7 meq/L — AB (ref 20.0–24.0)
Drawn by: 275531
O2 Content: 2 L/min
O2 Saturation: 93.1 %
PCO2 ART: 28.7 mmHg — AB (ref 35.0–45.0)
PH ART: 7.407 (ref 7.350–7.450)
Patient temperature: 98.6
TCO2: 18.6 mmol/L (ref 0–100)
pO2, Arterial: 59.6 mmHg — ABNORMAL LOW (ref 80.0–100.0)

## 2014-03-03 LAB — MAGNESIUM: MAGNESIUM: 2.5 mg/dL (ref 1.5–2.5)

## 2014-03-03 LAB — COMPREHENSIVE METABOLIC PANEL
ALBUMIN: 2.6 g/dL — AB (ref 3.5–5.2)
ALK PHOS: 171 U/L — AB (ref 39–117)
ALT: 894 U/L — ABNORMAL HIGH (ref 0–53)
AST: 1242 U/L — ABNORMAL HIGH (ref 0–37)
BUN: 44 mg/dL — ABNORMAL HIGH (ref 6–23)
CO2: 20 mEq/L (ref 19–32)
CREATININE: 2.33 mg/dL — AB (ref 0.50–1.35)
Calcium: 7.4 mg/dL — ABNORMAL LOW (ref 8.4–10.5)
Chloride: 108 mEq/L (ref 96–112)
GFR calc Af Amer: 30 mL/min — ABNORMAL LOW (ref >90)
GFR calc non Af Amer: 26 mL/min — ABNORMAL LOW (ref >90)
GLUCOSE: 68 mg/dL — AB (ref 70–99)
Potassium: 5.1 mEq/L (ref 3.7–5.3)
Sodium: 142 mEq/L (ref 137–147)
TOTAL PROTEIN: 6.4 g/dL (ref 6.0–8.3)
Total Bilirubin: 1.9 mg/dL — ABNORMAL HIGH (ref 0.3–1.2)

## 2014-03-03 LAB — CBG MONITORING, ED
Glucose-Capillary: 52 mg/dL — ABNORMAL LOW (ref 70–99)
Glucose-Capillary: 141 mg/dL — ABNORMAL HIGH (ref 70–99)

## 2014-03-03 LAB — LACTIC ACID, PLASMA: LACTIC ACID, VENOUS: 2.7 mmol/L — AB (ref 0.5–2.2)

## 2014-03-03 LAB — GLUCOSE, CAPILLARY
GLUCOSE-CAPILLARY: 147 mg/dL — AB (ref 70–99)
Glucose-Capillary: 132 mg/dL — ABNORMAL HIGH (ref 70–99)
Glucose-Capillary: 174 mg/dL — ABNORMAL HIGH (ref 70–99)
Glucose-Capillary: 61 mg/dL — ABNORMAL LOW (ref 70–99)
Glucose-Capillary: 81 mg/dL (ref 70–99)

## 2014-03-03 LAB — HEPATITIS PANEL, ACUTE
HCV Ab: NEGATIVE
HEP A IGM: NONREACTIVE
HEP B C IGM: NONREACTIVE
HEP B S AG: NEGATIVE

## 2014-03-03 LAB — MRSA PCR SCREENING: MRSA BY PCR: NEGATIVE

## 2014-03-03 LAB — BASIC METABOLIC PANEL
BUN: 41 mg/dL — ABNORMAL HIGH (ref 6–23)
CALCIUM: 7.2 mg/dL — AB (ref 8.4–10.5)
CO2: 17 mEq/L — ABNORMAL LOW (ref 19–32)
Chloride: 115 mEq/L — ABNORMAL HIGH (ref 96–112)
Creatinine, Ser: 2.23 mg/dL — ABNORMAL HIGH (ref 0.50–1.35)
GFR calc Af Amer: 31 mL/min — ABNORMAL LOW (ref >90)
GFR, EST NON AFRICAN AMERICAN: 27 mL/min — AB (ref >90)
Glucose, Bld: 138 mg/dL — ABNORMAL HIGH (ref 70–99)
POTASSIUM: 3.8 meq/L (ref 3.7–5.3)
Sodium: 145 mEq/L (ref 137–147)

## 2014-03-03 LAB — AMMONIA: Ammonia: 26 umol/L (ref 11–60)

## 2014-03-03 LAB — TROPONIN I
Troponin I: <0.3 ng/mL (ref <0.30)
Troponin I: <0.3 ng/mL (ref <0.30)

## 2014-03-03 LAB — CORTISOL: Cortisol, Plasma: 41.5 ug/dL

## 2014-03-03 MED ORDER — ATORVASTATIN CALCIUM 40 MG PO TABS
40.0000 mg | ORAL_TABLET | Freq: Every day | ORAL | Status: DC
  Filled 2014-03-03: qty 1

## 2014-03-03 MED ORDER — DEXTROSE 50 % IV SOLN
INTRAVENOUS | Status: AC
  Filled 2014-03-03: qty 50

## 2014-03-03 MED ORDER — DEXTROSE-NACL 5-0.9 % IV SOLN
INTRAVENOUS | Status: DC
  Administered 2014-03-04: 06:00:00 via INTRAVENOUS

## 2014-03-03 MED ORDER — CLOPIDOGREL BISULFATE 75 MG PO TABS
75.0000 mg | ORAL_TABLET | Freq: Every day | ORAL | Status: DC
  Filled 2014-03-03: qty 1

## 2014-03-03 MED ORDER — INSULIN ASPART 100 UNIT/ML ~~LOC~~ SOLN
0.0000 [IU] | SUBCUTANEOUS | Status: DC
  Administered 2014-03-03 (×2): 1 [IU] via SUBCUTANEOUS

## 2014-03-03 MED ORDER — SODIUM CHLORIDE 0.9 % IV SOLN: INTRAVENOUS | Status: DC

## 2014-03-03 MED ORDER — RANOLAZINE ER 500 MG PO TB12
500.0000 mg | ORAL_TABLET | Freq: Two times a day (BID) | ORAL | Status: DC
  Filled 2014-03-03 (×4): qty 1

## 2014-03-03 MED ORDER — LORAZEPAM 2 MG/ML IJ SOLN
0.5000 mg | Freq: Once | INTRAMUSCULAR | Status: AC
  Administered 2014-03-03: 0.5 mg via INTRAVENOUS

## 2014-03-03 MED ORDER — SODIUM CHLORIDE 0.9 % IJ SOLN
3.0000 mL | Freq: Two times a day (BID) | INTRAMUSCULAR | Status: DC
  Administered 2014-03-03 – 2014-03-09 (×6): 3 mL via INTRAVENOUS

## 2014-03-03 MED ORDER — BISACODYL 10 MG RE SUPP: 10.0000 mg | Freq: Every day | RECTAL | Status: DC | PRN

## 2014-03-03 MED ORDER — DEXTROSE 50 % IV SOLN
INTRAVENOUS | Status: AC
  Administered 2014-03-03: 50 mL
  Filled 2014-03-03: qty 50

## 2014-03-03 MED ORDER — PIPERACILLIN-TAZOBACTAM 3.375 G IVPB
3.3750 g | Freq: Three times a day (TID) | INTRAVENOUS | Status: DC
  Administered 2014-03-03 – 2014-03-04 (×3): 3.375 g via INTRAVENOUS
  Filled 2014-03-03 (×6): qty 50

## 2014-03-03 MED ORDER — IPRATROPIUM-ALBUTEROL 0.5-2.5 (3) MG/3ML IN SOLN: 3.0000 mL | Freq: Four times a day (QID) | RESPIRATORY_TRACT | Status: DC | PRN

## 2014-03-03 MED ORDER — ASPIRIN 81 MG PO CHEW: 81.0000 mg | CHEWABLE_TABLET | Freq: Every day | ORAL | Status: DC

## 2014-03-03 MED ORDER — ESCITALOPRAM OXALATE 5 MG PO TABS
5.0000 mg | ORAL_TABLET | Freq: Every day | ORAL | Status: DC
  Filled 2014-03-03: qty 1

## 2014-03-03 MED ORDER — LORAZEPAM 2 MG/ML IJ SOLN
INTRAMUSCULAR | Status: AC
  Filled 2014-03-03: qty 1

## 2014-03-03 MED ORDER — CARVEDILOL 3.125 MG PO TABS
3.1250 mg | ORAL_TABLET | Freq: Two times a day (BID) | ORAL | Status: DC
  Filled 2014-03-03 (×3): qty 1

## 2014-03-03 MED ORDER — HEPARIN SODIUM (PORCINE) 5000 UNIT/ML IJ SOLN
5000.0000 [IU] | Freq: Three times a day (TID) | INTRAMUSCULAR | Status: DC
  Administered 2014-03-03 – 2014-03-05 (×7): 5000 [IU] via SUBCUTANEOUS
  Filled 2014-03-03 (×10): qty 1

## 2014-03-03 NOTE — Progress Notes (Signed)
[facility]  Carma LairBenigno Winterhalter AVW:[id number] DOB: 07/17/1936 DOA: 02/04/2014 PCP: Terald SleeperOBSON,MICHAEL GAVIN, MD  Time spent :  Brief narrative: 78 y.o. male brought in from NH at request of family extensive PMH as noted below. Patient has had a 2 week slow progressive but continuous mental decline. He is normally awake, talking and "lucid." He is now to the point where he is non-verbal for them (or at least difficult to understand). They state he has been being treated for an "infection" at the NH, but it not clear from records where the infection is or what ABx are being used. He reportedly was complaining of abdominal tenderness at the time of arrival.   He had a recent admission in March after he was found to have a left ICA stenosis and had stent assisted angioplasty during that admit.   Work up in the ED showed numerous abnormalities including urinary retention, foley catheter was placed and 1.1L drained.  HPI/Subjective: Pt unresponsive- no family at bedside but later updated by phone per attending MD  Assessment/Plan: Active Problems:   Severe sepsis with septic shock/Shock liver -source unclear -criteria: hypothermia, hypotension,elevated lactic acid level, ARF, tachypnea, leukocytosis and hypoglycemia -BP variable and dipped into 70s during rounds so an additional 500 cc NS given-this correlated with patient's temperature increasing to normothermic range with use of warming blanket -has received almost 3L IVFs since admit -Cortisol level pending -Repeat lactic acid remains elevated and greater than 2 -attending MD d/w daughter risk vs benefits of pressor rxn- primary concern given severe LV dysfunction with DD2 HF that pressors could worsen hypotension and lead to possible MI-MD also offered option of palliative measures if family desires-family to meet and make decision -cont empiric broad spectrum anbx's - pt has h/o pseudomonas UTI recently   Metabolic encephalopathy  -due to hypotension and infection -exacerbated by prior CVA    Acute respiratory failure with hypoxia -due to hypoventilation - no infiltrates on CXR and appears stable based on chronic changes that look like edema    Acute urinary retention -required foley and > 1000cc drained in ER    CAD (coronary artery disease) -enzymes negative x 2 sets    Cardiomyopathy, ischemic -EF 15-20% -compensated without evidence of CHF -Patient on IV fluids D5 W. with normal saline at 6475ml/hr, monitor closely for fluid overload given patient's poor cardiac output    Acute-on-chronic kidney injury -mild rhabdo- CPK 680 -BUN and Scr double from baseline -cont IVFs -was on ACE I and Lasix pre admit held -suspect FTT contributory      Type II or unspecified type diabetes mellitus with peripheral circulatory disorders -current CBGs controlled-actually low so add dextrose to IVFs -have SSI available    Essential hypertension, benign -BP low so home meds on hold    History of CVA (cerebrovascular accident) -once awake ck swallowing   DVT prophylaxis: Subcutaneous heparin Code Status: DO NOT RESUSCITATE Family Communication: Attending M.D. And myselfspoke with patient's daughter Maxine GlennMonica Colon who is HCPOA and multiple other family members at the bedside and updated her on patient's status and options for care-if needed they wish to use pressors but if no response to pressors they desire to not escalate care but to focus on comfort Disposition Plan/Expected LOS: Step down   Consultants: None  Procedures: None  Antibiotics: Zosyn 6/28 >>> Vancomycin 6/28 >>>  Objective: Blood pressure 92/54, pulse 82, temperature 97.3 F (36.3 C), temperature source Axillary, resp. rate  22, height 4\' 9"  (1.448 m), weight 138 lb 3.7 oz (62.7 kg), SpO2 98.00%.  Intake/Output Summary (Last 24 hours) at 03/03/14 1245 Last data filed at 03/03/14 1139  Gross per 24 hour  Intake    650  ml  Output   1225 ml  Net   -575 ml     Exam: Follow up exam completed noting patient admitted at 3:24 AM today  Scheduled Meds:  Scheduled Meds: . aspirin  81 mg Oral Daily  . atorvastatin  40 mg Oral q1800  . carvedilol  3.125 mg Oral BID WC  . clopidogrel  75 mg Oral Daily  . escitalopram  5 mg Oral Daily  . heparin  5,000 Units Subcutaneous 3 times per day  . insulin aspart  0-9 Units Subcutaneous 6 times per day  . piperacillin-tazobactam (ZOSYN)  IV  3.375 g Intravenous 3 times per day  . ranolazine  500 mg Oral BID  . sodium chloride  3 mL Intravenous Q12H  . [START ON 03/04/2014] vancomycin  1,000 mg Intravenous Q48H   Continuous Infusions: . dextrose 5 % and 0.9% NaCl 100 mL/hr at 03/03/14 1145    Data Reviewed: Basic Metabolic Panel:  Recent Labs Lab 02/05/2014 1721 03/03/14 0420  NA 142 142  K 3.9 5.1  CL 104 108  CO2 23 20  GLUCOSE 175* 68*  BUN 49* 44*  CREATININE 2.43* 2.33*  CALCIUM 8.1* 7.4*  MG 2.8*  --    Liver Function Tests:  Recent Labs Lab 02/09/2014 1721 03/03/14 0420  AST 1190* 1242*  ALT 902* 894*  ALKPHOS 189* 171*  BILITOT 1.5* 1.9*  PROT 6.5 6.4  ALBUMIN 2.8* 2.6*    Recent Labs Lab 02/15/2014 1721  LIPASE 118*    Recent Labs Lab 03/03/14 2359  AMMONIA 26   CBC:  Recent Labs Lab 02/12/2014 1721 03/03/14 0420  WBC 12.3* 18.1*  NEUTROABS 9.7*  --   HGB 13.0 12.2*  HCT 39.9 37.6*  MCV 84.2 84.3  PLT 145* 139*   Cardiac Enzymes:  Recent Labs Lab 02/06/2014 1721 03/03/14 0300 03/03/14 0420 03/03/14 0720  CKTOTAL  --  680*  --   --   TROPONINI <0.30  --  <0.30 <0.30   BNP (last 3 results)  Recent Labs  11/29/13 1500  PROBNP 2846.0*   CBG:  Recent Labs Lab 03/03/14 0410 03/03/14 0438  GLUCAP 52* 141*    Recent Results (from the past 240 hour(s))  MRSA PCR SCREENING     Status: None   Collection Time    03/03/14  5:24 AM      Result Value Ref Range Status   MRSA by PCR NEGATIVE  NEGATIVE Final     Comment:            The GeneXpert MRSA Assay (FDA     approved for NASAL specimens     only), is one component of a     comprehensive MRSA colonization     surveillance program. It is not     intended to diagnose MRSA     infection nor to guide or     monitor treatment for     MRSA infections.     Studies:  Recent x-ray studies have been reviewed in detail by the Attending Physician       Junious SilkAllison Ellis, ANP [facility] Office  [phone] Pager [phone]   **If unable to reach the above provider after paging please contact the Flow Manager @  [phone]  On-Call/Text Page:      [email or web address]      password [email or web address]  If 7PM-7AM, please contact night-coverage [email or web address] Password [email or web address] 03/03/2014, 12:45 PM   LOS: 1 day   Examined patient with ANP Revonda Standard discussed assessment and plan, agree with above plan.  Answered all questions to patient and family concerning his plan of care.  Patient with complex multiple medical problems greater than 45 minutes in direct patient care

## 2014-03-03 NOTE — Progress Notes (Signed)
Patient transported from ER via stretcher on tele by ER RN. Patient unresponsive, GCS 3, grimaces to painful stimuli intermittently. Temp 96.4 rectal. No family at bedside. Bed alarm on. Callbell placed at side. Will continue to monitor. Benedetto Coons. Callahan, NP notified of low temp.

## 2014-03-03 NOTE — Progress Notes (Signed)
Chaplain visited with family after being referred by pt nurse. Pt did not speak or open his eyes but did nod or shake his head when spoken to at close range by his wife. Pt frequently made hand motion that family interpreted as brushing his teeth. Pt's wife and daughter were present. Chaplain provided emotional support and listened empathically as they shared family history and pt's past health issues. This is a family with strong faith foundation and while they desire pt's recovery they are at peace whatever happens. They realize they may soon be needing to make EOL decisions. Chaplain led them in prayer for pt and for family. They expressed appreciation for the visit.

## 2014-03-03 NOTE — H&P (Signed)
[facility] History and Physical  Francisco Brock ZOX:[id number] DOB: 1936/03/09 DOA: 03-13-14  Referring physician: EDP PCP: Terald Sleeper, MD   Chief Complaint: Failure to thrive   HPI: Francisco Brock is a 78 y.o. male brought in from NH at request of family extensive PMH as noted below.  Patient has had a 2 week slow progressive but continuous mental decline.  He is normally awake, talking and "lucid."  He is now to the point where he is non-verbal for them at home or at least difficult to understand.  They state he has been being treated for an "infection" at the NH, but it not clear from records where the infection is or what ABx are being used.  He reportedly was complaining of abdominal tenderness at the time of arrival.  He had a recent admission in March after he was found to have a left ICA stenosis and had stent assisted angioplasty during that admit.  Work up in the ED showed numerous abnormalities including urinary retention, foley catheter was placed and 1.1L drained.  Review of Systems: Unable to perform due to AMS.  Past Medical History  Diagnosis Date  . CAD (coronary artery disease)     a. s/p CABG 1998.  . Subdural hematoma     a. TBI 2011.  . 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 Cardiac defibrillator in place   . Chronic systolic CHF (congestive heart failure)     a. ICM.  Marland Kitchen Diabetes mellitus without complication   . Hypertension   . Hyperlipemia   . Peripheral vascular disease   . COPD (chronic obstructive pulmonary disease)   . VT (ventricular tachycardia)     a. s/p Medtronic ICD 2007, nearing ERI but patient had declined ERI changeout.  . Stroke 1998    no residual  . Automatic implantable cardioverter-defibrillator in situ   . Seizures     last one maybe 2012  . History of blood transfusion   . DVT (deep venous thrombosis)   . 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 Leg amputation above knee      right  . Leg amputation below knee      left  . Coronary artery bypass graft  2004  . Cardiac defibrillator placement  2007  . Balloon angioplasty, artery      rt lower leg  . Carotid endarterectomy      bilateral  . Amputation  06/28/2012    Procedure: AMPUTATION DIGIT;  Surgeon: Tami Ribas, MD;  Location: [facility] OR;  Service: Orthopedics;  Laterality: Left;  left  distal index finger  . Eye surgery Bilateral   . Radiology with anesthesia N/A 11/28/2013    Procedure: ANGIO WITH CAROTID STENT ;  Surgeon: Oneal Grout, MD;  Location: [facility] OR;  Service: Radiology;  Laterality: N/A;   Social History:  reports that he has quit smoking. His smoking use included Cigars. He does not have any smokeless tobacco history on file. He reports that he does not drink alcohol or use illicit drugs.  No Known Allergies  History reviewed. No pertinent family history.   Prior to Admission medications   Medication Sig Start Date End Date Taking? Authorizing Provider  aspirin 81 MG chewable tablet Chew 81 mg by mouth daily.   Yes Historical Provider, MD  bisacodyl (DULCOLAX) 10 MG suppository  Place 10 mg rectally daily as needed for mild constipation or moderate constipation.   Yes Historical Provider, MD  carvedilol (COREG) 3.125 MG tablet Take 1 tablet (3.125 mg total) by mouth 2 (two) times daily with a meal. 12/05/13  Yes Calvert CantorSaima Rizwan, MD  Cholecalciferol (VITAMIN D3) 50000 UNITS CAPS Take 50,000 Units by mouth every 30 (thirty) days. On the 15th of every month   Yes Historical Provider, MD  clopidogrel (PLAVIX) 75 MG tablet Take 75 mg by mouth daily.   Yes Historical Provider, MD  enalapril (VASOTEC) 5 MG tablet Take 5 mg by mouth daily.   Yes Historical Provider, MD  escitalopram (LEXAPRO) 5 MG tablet Take 5 mg by mouth daily.   Yes Historical Provider, MD  furosemide (LASIX) 20 MG tablet Take 20 mg by mouth daily.  12/20/13  Yes Calvert CantorSaima Rizwan, MD   glipiZIDE (GLUCOTROL XL) 5 MG 24 hr tablet Take 5 mg by mouth daily with breakfast.   Yes Historical Provider, MD  insulin aspart (NOVOLOG FLEXPEN) 100 UNIT/ML FlexPen Inject 2-10 Units into the skin 4 (four) times daily -  before meals and at bedtime. Per sliding scale   Yes Historical Provider, MD  ipratropium-albuterol (DUONEB) 0.5-2.5 (3) MG/3ML SOLN Take 3 mLs by nebulization every 6 (six) hours as needed (for wheezing or shortness of breath).   Yes Historical Provider, MD  Magnesium Hydroxide (MILK OF MAGNESIA PO) Take 30 mLs by mouth daily as needed (constipation).   Yes Historical Provider, MD  Multiple Vitamins-Minerals (MULTIVITAMIN WITH MINERALS) tablet Take 1 tablet by mouth daily.   Yes Historical Provider, MD  nitroGLYCERIN (NITROSTAT) 0.4 MG SL tablet Place 0.4 mg under the tongue every 5 (five) minutes as needed for chest pain.   Yes Historical Provider, MD  potassium chloride (K-DUR,KLOR-CON) 10 MEQ tablet Take 10 mEq by mouth daily.   Yes Historical Provider, MD  protective barrier (RESTORE) CREA Apply 1 application topically 3 (three) times daily as needed (to perlarea once a shift and after each incontinent episode).   Yes Historical Provider, MD  ranolazine (RANEXA) 500 MG 12 hr tablet Take 500 mg by mouth 2 (two) times daily.   Yes Historical Provider, MD  rosuvastatin (CRESTOR) 20 MG tablet Take 20 mg by mouth at bedtime.   Yes Historical Provider, MD   Physical Exam: Filed Vitals:   03/03/14 0303  BP: 93/53  Pulse: 90  Temp:   Resp: 22    BP 93/53  Pulse 90  Temp(Src) 98.1 F (36.7 C) (Rectal)  Resp 22  SpO2 96%  General Appearance:    Lethargic and not really arousing no distress, appears stated age  Head:    Normocephalic, atraumatic  Eyes:    PERRL, EOMI, sclera non-icteric        Nose:   Nares without drainage or epistaxis. Mucosa, turbinates normal  Throat:   Moist mucous membranes. Oropharynx without erythema or exudate.  Neck:   Supple. No carotid  bruits.  No thyromegaly.  No lymphadenopathy.   Back:     No CVA tenderness, no spinal tenderness  Lungs:     Clear to auscultation bilaterally, without wheezes, rhonchi or rales  Chest wall:    No tenderness to palpitation  Heart:    Regular rate and rhythm without murmurs, gallops, rubs  Abdomen:     Soft, non-tender, nondistended, normal bowel sounds, no organomegaly  Genitalia:    deferred  Rectal:    deferred  Extremities:   No clubbing, cyanosis  or edema.  Pulses:   2+ and symmetric all extremities  Skin:   Skin color, texture, turgor normal, no rashes or lesions  Lymph nodes:   Cervical, supraclavicular, and axillary nodes normal  Neurologic:   Lethargic    Labs on Admission:  Basic Metabolic Panel:  Recent Labs Lab 05-28-14 1721  NA 142  K 3.9  CL 104  CO2 23  GLUCOSE 175*  BUN 49*  CREATININE 2.43*  CALCIUM 8.1*  MG 2.8*   Liver Function Tests:  Recent Labs Lab 05-28-14 1721  AST 1190*  ALT 902*  ALKPHOS 189*  BILITOT 1.5*  PROT 6.5  ALBUMIN 2.8*    Recent Labs Lab 05-28-14 1721  LIPASE 118*    Recent Labs Lab 03/03/14 2359  AMMONIA 26   CBC:  Recent Labs Lab 05-28-14 1721  WBC 12.3*  NEUTROABS 9.7*  HGB 13.0  HCT 39.9  MCV 84.2  PLT 145*   Cardiac Enzymes:  Recent Labs Lab 05-28-14 1721  TROPONINI <0.30    BNP (last 3 results)  Recent Labs  11/29/13 1500  PROBNP 2846.0*   CBG: No results found for this basename: GLUCAP,  in the last 168 hours  Radiological Exams on Admission: Ct Abdomen Pelvis Wo Contrast  03/03/2014   CLINICAL DATA:  Anorexia.  Irregular breathing noted.  EXAM: CT ABDOMEN AND PELVIS WITHOUT CONTRAST  TECHNIQUE: Multidetector CT imaging of the abdomen and pelvis was performed following the standard protocol without IV contrast.  COMPARISON:  11/30/2013  FINDINGS: There are small bilateral pleural effusions noted. The liver appears normal. Intermediate attenuation fluid is identified within the  gallbladder. Normal appearance of the pancreas. The spleen appears somewhat atrophic.  The adrenal glands are both normal. Normal appearance of both kidneys. There is intermediate to high attenuation fluid within the urinary bladder which is partially collapsed around a Foley catheter. The prostate gland and seminal vesicles are unremarkable. There is a advanced calcified atherosclerotic disease involving the abdominal aorta. No aneurysm. No upper abdominal adenopathy. There is no pelvic or inguinal adenopathy.  The stomach appears normal. The small bowel loops have a normal course and caliber without obstruction. The appendix is visualized and appears normal. Normal appearance of the colon.  There is a small amount of low-attenuation fluid within the abdomen and pelvis.  The review of the visualized bony structures is significant for multi level degenerative disc disease within the lumbar spine.  IMPRESSION: 1. No acute findings identified within the abdomen and pelvis. 2. Small bilateral pleural effusions and ascites. 3. Advanced calcified atherosclerotic disease involves the abdominal aorta and its branches. 4. There is increased attenuation fluid within the gallbladder an what appears to be excreted contrast material within the urinary bladder. Correlation for any recent diagnostic exams using IV contrast material.   Electronically Signed   By: Signa Kellaylor  Stroud M.D.   On: 03/03/2014 01:06   Ct Head Wo Contrast  Apr 22, 2014   CLINICAL DATA:  Altered mental status.  EXAM: CT HEAD WITHOUT CONTRAST  TECHNIQUE: Contiguous axial images were obtained from the base of the skull through the vertex without intravenous contrast.  COMPARISON:  CT 09/23/2013 ; 05/21/2013.  FINDINGS: Motion artifact limits evaluation.  Ventricles and sulci are prominent for patient's age compatible with cerebral atrophy. Extensive periventricular and subcortical white matter hypodensity compatible with chronic small vessel ischemic change.  Right occipital lobe encephalomalacia, unchanged. Re- demonstrated bilateral basal ganglia lacunar infarcts. No evidence for acute cortically based infarct. No evidence for intracranial  hematoma, mass effect or mass lesion.  IMPRESSION: Motion artifact limits evaluation.  No acute intracranial process.  Chronic small vessel ischemic change.  Right occipital encephalomalacia.   Electronically Signed   By: Annia Belt M.D.   On: 02/26/2014 18:18   US Abdomen Complete  03/03/2014   CLINICAL DATA:  Elevated liver function tests. Urinary retention. Altered mental status.  EXAM: ULTRASOUND ABDOMEN COMPLETE  COMPARISON:  Abdominal CT from the same day  FINDINGS: Gallbladder:  No cholelithiasis. There is circumferential wall thickening which is likely from underdistention. No focal tenderness. There are cohesive mid level echoes which are mobile within the gallbladder lumen.  Common bile duct:  Diameter: 3 mm.  Liver:  No focal lesion identified. Within normal limits in parenchymal echogenicity.  IVC:  No abnormality visualized.  Pancreas:  Limited visualization.  No sonographic abnormality.  Spleen:  Limited visualization.  Right Kidney:  Length: 11 cm. Abnormally increased cortical echogenicity. There are echogenic/shadowing areas within the hilar region consistent with known arterial calcification. No hydronephrosis.  Left Kidney:  Length: 11 cm. Abnormally increased cortical echogenicity. There are echogenic areas within the hilar region consistent with known arterial calcification. No hydronephrosis.  Abdominal aorta:  No aneurysm visualized.  Other findings:  Small bilateral pleural effusion.  IMPRESSION: 1. Gallbladder sludge.  No cholelithiasis or acute cholecystitis. 2. Bilateral medical renal disease. 3. Small bilateral pleural effusion.   Electronically Signed   By: Tiburcio Pea M.D.   On: 03/03/2014 02:07   US Pelvis Limited  02/06/2014   CLINICAL DATA:  Abdominal pain.  EXAM: US PELVIS LIMITED   TECHNIQUE: Ultrasound examination of the pelvic soft tissues was performed in the area of clinical concern.  COMPARISON:  None.  FINDINGS: Limited examination of the pelvis was performed to evaluate the urinary bladder.  The bladder has a volume of 1179 cubic ml. The bladder wall appears normal. No abnormal filling defects within the urinary bladder.  IMPRESSION: 1. Distended bladder measuring 1179 ml.   Electronically Signed   By: Signa Kell M.D.   On: 02/10/2014 22:17   Dg Chest Portable 1 View  02/23/2014   CLINICAL DATA:  Altered mental status.  Subdural hematoma.  CHF.  EXAM: PORTABLE CHEST - 1 VIEW  COMPARISON:  11/30/2013.  FINDINGS: LEFT subclavian AICD leads appear unchanged. Median sternotomy. Unchanged broken sternal wires. Cardiomegaly is present with pulmonary vascular congestion and interstitial pulmonary edema. Basilar atelectasis. No focal consolidation to suggest pneumonia. Monitoring leads project over the chest.  IMPRESSION: Mild CHF.   Electronically Signed   By: Andreas Newport M.D.   On: 02/20/2014 18:26    EKG: Independently reviewed.  Grossly unchanged from previous.  Assessment/Plan Principal Problem:   Altered mental status Active Problems:   Type II or unspecified type diabetes mellitus with peripheral circulatory disorders, uncontrolled(250.72)   Shock liver   Acute-on-chronic kidney injury   Acute urinary retention   Hypotension   1. AMS - numerous etiologies are possible, empirically covering for sepsis given his hypotension and leukocytosis.  Zosyn and vanc.  Has AICD / Pacer in place so cannot perform MRI brain on this patient.  The 2 week decline does not at all sound like acute viral encephalitis.  Could be hypoperfusing with periods of hypotension, but given DNR status not really a good candidate for intubation and pressors at this time, and his SBP is over 90 anyhow here in the ED after fluids. 2. Shock liver - significant elevation of LFTs without imaging  abnormalities in the setting of hypotension is suspicious for shock liver.  Repeat CMP in am.  Ammonia is not elevated at this time so hepatic encephalopathy less likely the cause of his AMS. 3. AKI on CKD - likely due to urinary retention and hypotension, foley in place, measure intake and output and recheck BMP in AM. 4. Hypotension - causing his shock liver, contributing to his AKI and possibly his AMS, but the cause of this is not clear.  On empiric antibiotics, serial troponins ordered, 2d echo ordered, holding all BP meds.  Informally spoke with Dr. Amada Jupiter, EEG ordered but not much else to add for the moment.  Code Status: DNR/DNI (patient has code form AND this is confirmed by family)  Family Communication: No family present in room Disposition Plan: Admit to SDU   Time spent: 70 min  GARDNER, JARED M. [facility] Pager [phone]  If 7AM-7PM, please contact the day team taking care of the patient [email or web address] Password [email or web address] 03/03/2014, 3:24 AM

## 2014-03-03 NOTE — ED Notes (Signed)
CT notified that patient would not need to drink oral contrast per provider. Aware that patient is ready to be scanned. Ultrasound stated it would be a delay before they are available for patient.

## 2014-03-03 NOTE — ED Notes (Signed)
Attempted report 

## 2014-03-03 NOTE — ED Notes (Signed)
Pt becoming very restless, continues to deny pain when asked, pt pulling at monitor cords and IV line, PA notified and orders received.

## 2014-03-03 NOTE — Progress Notes (Signed)
Bedside EEG completed, results pending. 

## 2014-03-03 NOTE — Procedures (Signed)
ELECTROENCEPHALOGRAM REPORT  Patient: Francisco Brock       Room #: 3S16 EEG No. ID: [id number] Age: 78 y.o.        Sex: male Referring Physician: Joseph ArtWOODS, C Report Date:  03/03/2014        Interpreting Physician: Aline BrochureSTEWART,CHARLES R  History: Francisco Brock is an 78 y.o. male and an extensive medical history including, hypertension, coronary artery disease, subdural hematoma, diabetes mellitus and seizures, admitted following 2 week decline in cognitive functioning.  Indications for study:  Rule out encephalopathy.  Technique: This is an 18 channel routine scalp EEG performed at the bedside with bipolar and monopolar montages arranged in accordance to the international 10/20 system of electrode placement.   Description: EEG background activity consisted of low amplitude 1-2 Hz diffuse symmetrical delta activity with superimposed 6-7 Hz theta activity which is most prominent posteriorly. Photic stimulation was not performed. Hyperventilation was not performed. No epileptiform discharges were recorded.  Interpretation: CT is abnormal with mild to moderate generalized nonspecific continuous slowing of cerebral activity. This pattern of slowing can be seen with degenerative, as well as metabolic and toxic encephalopathies. No evidence of an epileptic disorder is demonstrated.   Venetia MaxonR Stewart M.D. [facility] [phone] and

## 2014-03-03 NOTE — ED Provider Notes (Signed)
[id number] - Patient care assumed from Dr. Raeford RazorStephen Kohut at shift change. Patient with decreased AMS; used to be lucid, now sleeping all day and nearly nonverbal. Patient found to be in acute renal failure today with increased LFTs. Patient also with urinary retention. Urology came to replace foley as patient found to have 1100cc in bladder. New foley draining. CT scan and Abd U/S with no distinct evidence to explain LFTs. ? shock liver. Case discussed with Dr. Lyda PeroneJared Gardner of [facility] who will admit. Patient sleeping comfortably in bed at this time.   Filed Vitals:   03/03/14 0100 03/03/14 0201 03/03/14 0230 03/03/14 0303  BP: 93/49 99/74 111/59 93/53  Pulse: 77 55 74 90  Temp:      TempSrc:      Resp: 23  23 22   SpO2: 95% 98% 100% 96%     Antony MaduraKelly Humes, PA-C 03/03/14 [phone]

## 2014-03-03 NOTE — Progress Notes (Signed)
Utilization review completed.  

## 2014-03-03 NOTE — ED Provider Notes (Signed)
Medical screening examination/treatment/procedure(s) were performed by non-physician practitioner and as supervising physician I was immediately available for consultation/collaboration.   EKG Interpretation None        Enid SkeensJoshua M Zavitz, MD 03/03/14 [phone]

## 2014-03-03 NOTE — ED Notes (Signed)
Pt to ultrasound at this time.

## 2014-03-03 NOTE — Progress Notes (Signed)
ANTIBIOTIC CONSULT NOTE - INITIAL  Pharmacy Consult for Zosyn (already on vancomycin) Indication: rule out sepsis  No Known Allergies    Vital Signs: Temp: 98.1 F (36.7 C) (06/28 1632) Temp src: Rectal (06/28 1632) BP: 93/53 mmHg (06/29 0303) Pulse Rate: 90 (06/29 0303)  Labs:  Recent Labs  10/19/2013 1721  WBC 12.3*  HGB 13.0  PLT 145*  CREATININE 2.43*   Medical History: Past Medical History  Diagnosis Date  . CAD (coronary artery disease)     a. s/p CABG 1998.  . Subdural hematoma     a. TBI 2011.  . 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 Cardiac defibrillator in place   . Chronic systolic CHF (congestive heart failure)     a. ICM.  Marland Kitchen. Diabetes mellitus without complication   . Hypertension   . Hyperlipemia   . Peripheral vascular disease   . COPD (chronic obstructive pulmonary disease)   . VT (ventricular tachycardia)     a. s/p Medtronic ICD 2007, nearing ERI but patient had declined ERI changeout.  . Stroke 1998    no residual  . Automatic implantable cardioverter-defibrillator in situ   . Seizures     last one maybe 2012  . History of blood transfusion   . DVT (deep venous thrombosis)   . 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 WBC 12.3, Scr 2.43, other labs as above.   Plan:  -Zosyn 3.375G IV q8h to be infused over 4 hours, may need to change if renal function worsens any further -Trend WBC, temp, renal function   Abran DukeLedford, James 03/03/2014,3:30 AM

## 2014-03-03 NOTE — Progress Notes (Signed)
INITIAL NUTRITION ASSESSMENT  DOCUMENTATION CODES Per approved criteria  -Not Applicable   INTERVENTION:  Diet advancement as able per MD as mental status improves if within goals of care.  NUTRITION DIAGNOSIS: Inadequate oral intake related to inability to eat as evidenced by NPO status.   Goal: Intake to meet >90% of estimated nutrition needs.  Monitor:  Diet advancement, PO intake, labs, weight trend.  Reason for Assessment: Low Braden  78 y.o. male  Admitting Dx: Altered mental status; severe sepsis, shock liver  ASSESSMENT: 78 y.o. male brought in from NH at request of family with extensive PMH as noted below. Patient has had a 2 week slow progressive but continuous mental decline. Work up in the ED showed numerous abnormalities including urinary retention, foley catheter was placed and 1.1 L drained.  S/P EEG this AM which showed mild to moderate generalized nonspecific continuous slowing of cerebral activity. Family to meet and decide on overall goals of care, ? palliative care.  Nutrition focused physical exam completed.  No muscle or subcutaneous fat depletion noticed. Patient with left BKA and right AKA. Unsure of height prior to amputations.   Patient with 9% weight loss over the past 3 months; expect intake was poor PTA, given recent mental decline.  Height: Ht Readings from Last 1 Encounters:  03/03/14 4\' 9"  (1.448 m)    Weight: Wt Readings from Last 1 Encounters:  03/03/14 138 lb 3.7 oz (62.7 kg)    Ideal Body Weight: NA  % Ideal Body Weight: NA  Wt Readings from Last 10 Encounters:  03/03/14 138 lb 3.7 oz (62.7 kg)  12/05/13 143 lb 8 oz (65.091 kg)  12/05/13 143 lb 8 oz (65.091 kg)  11/22/13 152 lb (68.947 kg)  05/13/13 151 lb (68.493 kg)  03/20/13 148 lb 9.6 oz (67.405 kg)  02/06/13 151 lb (68.493 kg)  06/28/12 131 lb (59.421 kg)  06/28/12 131 lb (59.421 kg)    Usual Body Weight: 152 lb (3 months ago)  % Usual Body Weight: 91%  BMI:   Unable to accurately calculate without pre-amputation height  Estimated Nutritional Needs: Kcal: 1500-1700 Protein: 70-85 gm Fluid: 1.6-1.8 L  Skin: intact  Diet Order: NPO  EDUCATION NEEDS: -Education not appropriate at this time   Intake/Output Summary (Last 24 hours) at 03/03/14 1014 Last data filed at 03/03/14 0800  Gross per 24 hour  Intake    350 ml  Output   1000 ml  Net   -650 ml    Last BM: None documented since admission  Labs:   Recent Labs Lab 02/11/2014 1721 03/03/14 0420  NA 142 142  K 3.9 5.1  CL 104 108  CO2 23 20  BUN 49* 44*  CREATININE 2.43* 2.33*  CALCIUM 8.1* 7.4*  MG 2.8*  --   GLUCOSE 175* 68*    CBG (last 3)   Recent Labs  03/03/14 0410 03/03/14 0438  GLUCAP 52* 141*    Scheduled Meds: . aspirin  81 mg Oral Daily  . atorvastatin  40 mg Oral q1800  . carvedilol  3.125 mg Oral BID WC  . clopidogrel  75 mg Oral Daily  . dextrose      . escitalopram  5 mg Oral Daily  . heparin  5,000 Units Subcutaneous 3 times per day  . insulin aspart  0-9 Units Subcutaneous 6 times per day  . piperacillin-tazobactam (ZOSYN)  IV  3.375 g Intravenous 3 times per day  . ranolazine  500 mg Oral BID  .  sodium chloride  3 mL Intravenous Q12H  . [START ON 03/04/2014] vancomycin  1,000 mg Intravenous Q48H    Continuous Infusions: . sodium chloride 100 mL/hr at 03/03/14 0530    Past Medical History  Diagnosis Date  . CAD (coronary artery disease)     a. s/p CABG 1998.  . Subdural hematoma     a. TBI 2011.  . 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 Cardiac defibrillator in place   . Chronic systolic CHF (congestive heart failure)     a. ICM.  Marland Kitchen. Diabetes mellitus without complication   . Hypertension   . Hyperlipemia   . Peripheral vascular disease   . COPD (chronic obstructive pulmonary disease)   . VT (ventricular tachycardia)     a. s/p Medtronic ICD 2007, nearing ERI but patient had declined ERI changeout.  .  Stroke 1998    no residual  . Automatic implantable cardioverter-defibrillator in situ   . Seizures     last one maybe 2012  . History of blood transfusion   . DVT (deep venous thrombosis)   . 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 Leg amputation above knee      right  . Leg amputation below knee      left  . Coronary artery bypass graft  2004  . Cardiac defibrillator placement  2007  . Balloon angioplasty, artery      rt lower leg  . Carotid endarterectomy      bilateral  . Amputation  06/28/2012    Procedure: AMPUTATION DIGIT;  Surgeon: Tami RibasKevin R Kuzma, MD;  [facility];  Service: Orthopedics;  Laterality: Left;  left  distal index finger  . Eye surgery Bilateral   . Radiology with anesthesia N/A 11/28/2013    Procedure: ANGIO WITH CAROTID STENT ;  Surgeon: Oneal GroutSanjeev K Deveshwar, MD;  Location: [facility] OR;  Service: Radiology;  Laterality: N/A;    Joaquin CourtsKimberly Harris, RD, LDN, CNSC Pager [phone] After Hours Pager [phone]

## 2014-03-03 NOTE — ED Notes (Signed)
CBG Taken = 52

## 2014-03-04 ENCOUNTER — Inpatient Hospital Stay (HOSPITAL_COMMUNITY): Payer: PRIVATE HEALTH INSURANCE

## 2014-03-04 DIAGNOSIS — E162 Hypoglycemia, unspecified: Secondary | ICD-10-CM

## 2014-03-04 DIAGNOSIS — E872 Acidosis, unspecified: Secondary | ICD-10-CM | POA: Diagnosis present

## 2014-03-04 DIAGNOSIS — E870 Hyperosmolality and hypernatremia: Secondary | ICD-10-CM

## 2014-03-04 DIAGNOSIS — E876 Hypokalemia: Secondary | ICD-10-CM

## 2014-03-04 LAB — CBC
HEMATOCRIT: 40 % (ref 39.0–52.0)
Hemoglobin: 12.7 g/dL — ABNORMAL LOW (ref 13.0–17.0)
MCH: 26.9 pg (ref 26.0–34.0)
MCHC: 31.8 g/dL (ref 30.0–36.0)
MCV: 84.7 fL (ref 78.0–100.0)
PLATELETS: 145 10*3/uL — AB (ref 150–400)
RBC: 4.72 MIL/uL (ref 4.22–5.81)
RDW: 17.1 % — ABNORMAL HIGH (ref 11.5–15.5)
WBC: 16.6 10*3/uL — AB (ref 4.0–10.5)

## 2014-03-04 LAB — GLUCOSE, CAPILLARY
GLUCOSE-CAPILLARY: 140 mg/dL — AB (ref 70–99)
GLUCOSE-CAPILLARY: 69 mg/dL — AB (ref 70–99)
Glucose-Capillary: 73 mg/dL (ref 70–99)
Glucose-Capillary: 71 mg/dL (ref 70–99)
Glucose-Capillary: 89 mg/dL (ref 70–99)
Glucose-Capillary: 69 mg/dL — ABNORMAL LOW (ref 70–99)
Glucose-Capillary: 139 mg/dL — ABNORMAL HIGH (ref 70–99)
Glucose-Capillary: 101 mg/dL — ABNORMAL HIGH (ref 70–99)

## 2014-03-04 LAB — COMPREHENSIVE METABOLIC PANEL
ALBUMIN: 2.3 g/dL — AB (ref 3.5–5.2)
ALT: 855 U/L — ABNORMAL HIGH (ref 0–53)
AST: 1287 U/L — AB (ref 0–37)
Alkaline Phosphatase: 181 U/L — ABNORMAL HIGH (ref 39–117)
BILIRUBIN TOTAL: 2.5 mg/dL — AB (ref 0.3–1.2)
BUN: 39 mg/dL — ABNORMAL HIGH (ref 6–23)
CHLORIDE: 117 meq/L — AB (ref 96–112)
CO2: 17 mEq/L — ABNORMAL LOW (ref 19–32)
Calcium: 7.5 mg/dL — ABNORMAL LOW (ref 8.4–10.5)
Creatinine, Ser: 2.33 mg/dL — ABNORMAL HIGH (ref 0.50–1.35)
GFR calc Af Amer: 30 mL/min — ABNORMAL LOW (ref >90)
GFR calc non Af Amer: 26 mL/min — ABNORMAL LOW (ref >90)
Glucose, Bld: 75 mg/dL (ref 70–99)
Potassium: 3.3 mEq/L — ABNORMAL LOW (ref 3.7–5.3)
Sodium: 150 mEq/L — ABNORMAL HIGH (ref 137–147)
Total Protein: 6 g/dL (ref 6.0–8.3)

## 2014-03-04 LAB — URINE CULTURE
COLONY COUNT: NO GROWTH
Colony Count: NO GROWTH
Culture: NO GROWTH
Culture: NO GROWTH

## 2014-03-04 LAB — CK: CK TOTAL: 571 U/L — AB (ref 7–232)

## 2014-03-04 MED ORDER — DEXTROSE 50 % IV SOLN: 50.0000 mL | Freq: Once | INTRAVENOUS | Status: DC | PRN

## 2014-03-04 MED ORDER — DEXTROSE 50 % IV SOLN
INTRAVENOUS | Status: AC
  Administered 2014-03-04: 50 mL
  Filled 2014-03-04: qty 50

## 2014-03-04 MED ORDER — SODIUM BICARBONATE 8.4 % IV SOLN
INTRAVENOUS | Status: DC
  Administered 2014-03-04: 09:00:00 via INTRAVENOUS
  Filled 2014-03-04 (×2): qty 150

## 2014-03-04 MED ORDER — DEXTROSE 50 % IV SOLN
50.0000 mL | INTRAVENOUS | Status: DC | PRN
  Administered 2014-03-05 (×3): 50 mL via INTRAVENOUS
  Filled 2014-03-04 (×3): qty 50

## 2014-03-04 MED ORDER — STERILE WATER FOR INJECTION IV SOLN
INTRAVENOUS | Status: DC
  Administered 2014-03-04 – 2014-03-06 (×2): via INTRAVENOUS
  Filled 2014-03-04 (×6): qty 850

## 2014-03-04 MED ORDER — PIPERACILLIN-TAZOBACTAM IN DEX 2-0.25 GM/50ML IV SOLN
2.2500 g | Freq: Four times a day (QID) | INTRAVENOUS | Status: DC
  Administered 2014-03-04 – 2014-03-06 (×9): 2.25 g via INTRAVENOUS
  Filled 2014-03-04 (×10): qty 50

## 2014-03-04 MED ORDER — DEXTROSE-NACL 5-0.45 % IV SOLN
INTRAVENOUS | Status: DC
  Administered 2014-03-04: 06:00:00 via INTRAVENOUS

## 2014-03-04 MED ORDER — POTASSIUM CHLORIDE 10 MEQ/100ML IV SOLN
10.0000 meq | INTRAVENOUS | Status: AC
  Administered 2014-03-04 (×4): 10 meq via INTRAVENOUS
  Filled 2014-03-04 (×4): qty 100

## 2014-03-04 NOTE — Progress Notes (Signed)
ANTIBIOTIC CONSULT NOTE - Follow-up  Pharmacy Consult for Zosyn, vancomycin Indication: rule out sepsis  No Known Allergies    Vital Signs: Temp: 98.6 F (37 C) (06/30 0355) Temp src: Oral (06/30 0355) BP: 101/77 mmHg (06/30 0355) Pulse Rate: 95 (06/30 0355)  Labs:  Recent Labs  02/19/2014 1721 03/03/14 0420 03/03/14 1500 03/04/14 0230  WBC 12.3* 18.1*  --  16.6*  HGB 13.0 12.2*  --  12.7*  PLT 145* 139*  --  145*  CREATININE 2.43* 2.33* 2.23* 2.33*   Assessment: 78 yom continues on vancomycin + zosyn. CrCl has been borderline for zosyn dosing but has not improved at all. Will require dose adjustment for now. Today is D#3 of broad-spectrum antibiotics for empiric treatment of sepsis. Cultures are negative to date. Pt is afebrile and WBC is elevated at 16.6.  Vanc 6/28>> Zosyn 6/28>>  6/28 Urine x 2 - NEG 6/28 Blood x 2 - pending  Plan:  1. Continue vanc 1gm IV Q48H 2. Change zosyn to 2.25gm IV Q6H 3. F/u renal fxn, C&S, clinical status and [facility]  Rachel Rumbarger, PharmD, BCPS Pager # [phone] 03/04/2014 8:20 AM

## 2014-03-04 NOTE — Progress Notes (Signed)
Adult Hypoglycemia Protocol Treatment Guidelines  1. RN shall initiate Hypoglycemia Protocol emergency measures immediately when:            w        Routine or STAT CBG and/or a lab glucose indicates hypoglycemia (CBG < 70 mg/dl)  2. Treat the patient according to ability to take PO's and severity of hypoglycemia.   3. If patient is on GlucoStabilizer, follow directions provided by the GlucoStabilizer for hypoglycemic events.  4. If patient on insulin pump, follow Hypoglycemia Protocol.  If patient requires more than one treatment have patient place pump in SUSPEND and notify MD.  DO NOT leave pump in SUSPEND for greater than 30 minutes unless ordered by MD.  A. Treatment for Mild or Moderate-Patient cooperative and able to swallow    1.  Patient taking PO's and can cooperate   a.  Give one of the following 15 gram CHO options:                           w     1 tube oral dextrose gel                           w     3-4 Glucose tablets                           w     4 oz. Juice                           w     4 oz. regular soda                                    ESRD patients:  clear, regular soda                           w     8 oz. skim milk    b.  Recheck CBG in 15 minutes after treatment                            w       If CBG < 70 mg/dl, repeat treatment and recheck until hypoglycemia is resolved                            w       If CBG > 70 mg/dl and next meal is more than 1 hour away, give additional 15 grams CHO   2.  Patient NPO-Patient cooperative and no altered mental status    a.  Give 25 ml of D50 IV.   b.  Recheck CBG in 15 minutes after treatment.                             w         If CBG is less than 70 mg/dl, repeat treatment and recheck until hypoglycemia is resolved.   c.  Notify MD for further orders.             SPECIAL CONSIDERATIONS:    a.    If no IV access,                              w        Start IV of D5W at KVO                             w         Give 25 ml of D50 IV.    b.  If unable to gain IV access                             w          Give Glucagon IM:     i.  1 mg if patient weighs more than 45.5 kg     ii.  0.5 mg if patient weighs less than 45.5 kg   c.  Notify MD for further orders  B. Treatment for Severe-- Patient unconscious or unable to take PO's safely    1.  Position patient on side   2.  Give 50 ml D50 IV   3.  Recheck CBG in 15 minutes.                    w      If CBG is less than 70 mg/dl, repeat treatment and recheck until hypoglycemia is resolved.   4.  Notify MD for further orders.    SPECIAL CONSIDERATIONS:    a.  If no IV access                              w     Give Glucagon IM                                              i.  1 mg if patient weighs more than 45.5 kg                                             ii.  0.5 mg if patient weighs less than 45.5 kg                              w      Start IV of D5W at 50 ml/hr and give 50 ml D50 IV   b.  If no IV access and active seizure                               w       Call Rapid Response   c.  If unable to gain IV access, give Glucagon IM:                              w          1 mg if patient weighs more than 45.5 kg                                w          0.5 mg if patient weighs less than 45.5 kg   d.  Notify MD for further orders.  C. Complete smart text progress note to document intervention and follow-up CBG   1. In [facility] patient chart, click on Notes (left side of screen)   2. Create Progress Note   3. Click on Insert Smart Text.  In the Match box type "hypo" and enter    4. Double click on [facility] IP HYPOGLYCEMIC EVENT and enter data   5. MD must be notified if patient is NPO or experienced severe hypoglycemia  

## 2014-03-04 NOTE — Progress Notes (Signed)
[facility]  Carma LairBenigno Toto ZOX:[id number] DOB: 1935-11-15 DOA: 02/05/2014 PCP: Terald SleeperOBSON,MICHAEL GAVIN, MD  Time spent :  Brief narrative: 78 y.o. HM PMHx ischemic cardiomyopathy S./P. ICD, history CVA, diabetes type 2 with peripheral circulatory disorder uncontrolled, HTN, dyslipidemia.  brought in from NH at request of family extensive PMH as noted below. Patient has had a 2 week slow progressive but continuous mental decline. He is normally awake, talking and "lucid." He is now to the point where he is non-verbal for them (or at least difficult to understand). They state he has been being treated for an "infection" at the NH, but it not clear from records where the infection is or what ABx are being used. He reportedly was complaining of abdominal tenderness at the time of arrival.   He had a recent admission in March after he was found to have a left ICA stenosis and had stent assisted angioplasty during that admit.   Work up in the ED showed numerous abnormalities including urinary retention, foley catheter was placed and 1.1L drained.  HPI/Subjective: Pt slightly more responsive but still quite lethargic  Assessment/Plan: Active Problems: Metabolic acidosis -Obtain ABG in a.m. -Continue sodium bicarbonate in sterile water 5475ml/hr    Severe sepsis with septic shock/Shock liver -source unclear-urine cx negative-unclear if was on anbx's pre admit which would give false negative cx- UA was mildly abnormal -more congested today so will repeat CXR to r/o PNA -criteria: hypothermia, hypotension,elevated lactic acid level, ARF, tachypnea, leukocytosis and hypoglycemia -BP variable and dipped into 70s during rounds so an additional 500 cc NS given-this correlated with patient's temperature increasing to normothermic range with use of warming blanket -has received almost 3L IVFs since admit -Cortisol level appropriately elevated at 41.5 -Repeat lactic  acid remained elevated at 2.7 but this could be 2/2 ARF -6/29: attending MD d/w daughter risk vs benefits of pressor rxn- primary concern given severe LV dysfunction with DD2 HF that pressors could worsen hypotension and lead to possible MI-MD also offered option of palliative measures if family desires-family to meet and make decision -cont empiric broad spectrum anbx's - pt has h/o pseudomonas UTI recently    Metabolic encephalopathy  -due to hypotension and infection -exacerbated by prior CVA-? has FTT -will need SLP eval before allow diet    Acute respiratory failure with hypoxia -due to hypoventilation - no infiltrates on CXR and appears stable based on chronic changes that look like edema-FU CXR after hydration ordered today    Acute urinary retention -required foley and > 1000cc drained in ER -? A recurrent problem    Acute-on-chronic kidney injury -mild rhabdo- CPK 680 -BUN and Scr double from baseline -cont IVFs but given persistent acidemia will begin Bicarb drip -was on ACE I and Lasix pre admit so have held for now -suspect FTT contributory    CAD (coronary artery disease) -enzymes negative x 2 sets    Cardiomyopathy, ischemic -EF 15-20% -compensated without evidence of CHF -since starting Bicarb drip will decrease maintenance fluids to 10/hr, monitor closely for fluid overload given patient's poor cardiac output  Hypernatremia -Sodium bicarbonate in a sterile water 8775ml/hr -Check CMP in a.m.  Hypokalemia -Replete potassium IV 10 mEq x4 runs -Recheck in Am -Check magnesium level  Hypoglycemia -Dextrose 50% IV PRN for CBG< 60      Type II or unspecified type diabetes mellitus with peripheral circulatory disorders -current CBGs controlled-actually low so add dextrose to IVFs -have  SSI available    Essential hypertension, benign -BP low so home meds on hold    History of CVA (cerebrovascular accident) -once awake ck swallowing   DVT prophylaxis:  Subcutaneous heparin Code Status: DO NOT RESUSCITATE Family Communication: 6/29: Attending M.D.and myself spoke with patient's daughter Maxine Glenn Colon who is HCPOA and multiple other family members at the bedside and updated her on patient's status and options for care-if needed they wish to use pressors but if no response to pressors they desire to not escalate care but to focus on comfort Disposition Plan/Expected LOS: Step down   Consultants: None   Procedures: None   Cultures 6/28 blood right/left antecubital NGTD 6/28 urine x2 negative 6/29 MRSA by PCR negative   Antibiotics: Zosyn 6/28 >>> Vancomycin 6/28 >>>  Objective: Blood pressure 114/59, pulse 97, temperature 96.9 F (36.1 C), temperature source Axillary, resp. rate 26, height 4\' 9"  (1.448 m), weight 138 lb 3.7 oz (62.7 kg), SpO2 95.00%.  Intake/Output Summary (Last 24 hours) at 03/04/14 1313 Last data filed at 03/04/14 1047  Gross per 24 hour  Intake 2510.42 ml  Output   1275 ml  Net 1235.42 ml     Exam: General: Somnolent, will not wake up to painful stimulation, (RN states did spontaneously open eyes  today) No acute respiratory distress Lungs: Clear to auscultation bilaterally without wheezes or crackles, RA Cardiovascular: Regular rate and rhythm without murmur gallop or rub normal S1 and S2, no peripheral edema or JVD Abdomen: Nontender, nondistended, soft, bowel sounds positive, no rebound, no ascites, no appreciable mass Musculoskeletal: No significant cyanosis, clubbing of bilateral lower extremities  Scheduled Meds:  Scheduled Meds: . heparin  5,000 Units Subcutaneous 3 times per day  . insulin aspart  0-9 Units Subcutaneous 6 times per day  . piperacillin-tazobactam (ZOSYN)  IV  2.25 g Intravenous 4 times per day  . ranolazine  500 mg Oral BID  . sodium chloride  3 mL Intravenous Q12H  . vancomycin  1,000 mg Intravenous Q48H   Continuous Infusions: . dextrose 5 % and 0.45% NaCl 10 mL/hr at  03/04/14 0914  .  sodium bicarbonate  infusion 1000 mL 75 mL/hr at 03/04/14 0911    Data Reviewed: Basic Metabolic Panel:  Recent Labs Lab 02/20/2014 1721 03/03/14 0420 03/03/14 1500 03/03/14 1532 03/04/14 0230  NA 142 142 145  --  150*  K 3.9 5.1 3.8  --  3.3*  CL 104 108 115*  --  117*  CO2 23 20 17*  --  17*  GLUCOSE 175* 68* 138*  --  75  BUN 49* 44* 41*  --  39*  CREATININE 2.43* 2.33* 2.23*  --  2.33*  CALCIUM 8.1* 7.4* 7.2*  --  7.5*  MG 2.8*  --   --  2.5  --    Liver Function Tests:  Recent Labs Lab 02/19/2014 1721 03/03/14 0420 03/04/14 0230  AST 1190* 1242* 1287*  ALT 902* 894* 855*  ALKPHOS 189* 171* 181*  BILITOT 1.5* 1.9* 2.5*  PROT 6.5 6.4 6.0  ALBUMIN 2.8* 2.6* 2.3*    Recent Labs Lab 02/04/2014 1721  LIPASE 118*    Recent Labs Lab 03/03/14 2359  AMMONIA 26   CBC:  Recent Labs Lab 02/08/2014 1721 03/03/14 0420 03/04/14 0230  WBC 12.3* 18.1* 16.6*  NEUTROABS 9.7*  --   --   HGB 13.0 12.2* 12.7*  HCT 39.9 37.6* 40.0  MCV 84.2 84.3 84.7  PLT 145* 139* 145*   Cardiac  Enzymes:  Recent Labs Lab 02/20/2014 1721 03/03/14 0300 03/03/14 0420 03/03/14 0720 03/03/14 1500 03/04/14 0230  CKTOTAL  --  680*  --   --   --  571*  TROPONINI <0.30  --  <0.30 <0.30 <0.30  --    BNP (last 3 results)  Recent Labs  11/29/13 1500  PROBNP 2846.0*   CBG:  Recent Labs Lab 03/03/14 2029 03/03/14 2347 03/04/14 0358 03/04/14 0827 03/04/14 1202  GLUCAP 81 69* 73 71 89    Recent Results (from the past 240 hour(s))  CULTURE, BLOOD (ROUTINE X 2)     Status: None   Collection Time    02/28/2014  5:11 PM      Result Value Ref Range Status   Specimen Description BLOOD RIGHT ANTECUBITAL   Final   Special Requests BOTTLES DRAWN AEROBIC AND ANAEROBIC 10CC EA   Final   Culture  Setup Time     Final   Value: 03/03/2014 01:49     Performed at [facility]   Culture     Final   Value:        BLOOD CULTURE RECEIVED NO GROWTH TO DATE CULTURE  WILL BE HELD FOR 5 DAYS BEFORE ISSUING A FINAL NEGATIVE REPORT     Performed at [facility]   Report Status PENDING   Incomplete  CULTURE, BLOOD (ROUTINE X 2)     Status: None   Collection Time    02/03/2014  5:21 PM      Result Value Ref Range Status   Specimen Description BLOOD LEFT ANTECUBITAL   Final   Special Requests BOTTLES DRAWN AEROBIC ONLY 5CC   Final   Culture  Setup Time     Final   Value: 03/03/2014 01:49     Performed at [facility]   Culture     Final   Value:        BLOOD CULTURE RECEIVED NO GROWTH TO DATE CULTURE WILL BE HELD FOR 5 DAYS BEFORE ISSUING A FINAL NEGATIVE REPORT     Performed at [facility]   Report Status PENDING   Incomplete  URINE CULTURE     Status: None   Collection Time    02/14/2014  5:38 PM      Result Value Ref Range Status   Specimen Description URINE, CLEAN CATCH   Final   Special Requests NONE   Final   Culture  Setup Time     Final   Value: 03/03/2014 02:19     Performed at [facility]   Colony Count     Final   Value: NO GROWTH     Performed at [facility]   Culture     Final   Value: NO GROWTH     Performed at [facility]   Report Status 03/04/2014 FINAL   Final  URINE CULTURE     Status: None   Collection Time    02/06/2014 11:34 PM      Result Value Ref Range Status   Specimen Description URINE, CATHETERIZED   Final   Special Requests NONE   Final   Culture  Setup Time     Final   Value: 02/28/2014 23:55     Performed at [facility]   Colony Count     Final   Value: NO GROWTH     Performed at [facility]   Culture     Final   Value: NO  GROWTH     Performed at [facility]   Report Status 03/04/2014 FINAL   Final  MRSA PCR SCREENING     Status: None   Collection Time    03/03/14  5:24 AM      Result Value Ref Range Status   MRSA by PCR NEGATIVE  NEGATIVE Final   Comment:            The GeneXpert MRSA Assay (FDA     approved for NASAL specimens      only), is one component of a     comprehensive MRSA colonization     surveillance program. It is not     intended to diagnose MRSA     infection nor to guide or     monitor treatment for     MRSA infections.     Studies:  Recent x-ray studies have been reviewed in detail by the Attending Physician       Junious Silk, ANP [facility] Office  [phone] Pager [phone]   **If unable to reach the above provider after paging please contact the Flow Manager @ [phone]  On-Call/Text Page:      [email or web address]      password [email or web address]  If 7PM-7AM, please contact night-coverage [email or web address] Password [email or web address] 03/04/2014, 1:13 PM   LOS: 2 days   Examining patient and discussed assessment and plan with ANP Revonda Standard and agree with the above plan. Patient with complex multiple medical problems, which required greater than 45 minutes in direct medical care. Answered all patient's and family's questions concerning plan of care

## 2014-03-05 DIAGNOSIS — I498 Other specified cardiac arrhythmias: Secondary | ICD-10-CM

## 2014-03-05 LAB — CBC WITH DIFFERENTIAL/PLATELET
BASOS ABS: 0 10*3/uL (ref 0.0–0.1)
Basophils Relative: 0 % (ref 0–1)
Eosinophils Absolute: 0.1 10*3/uL (ref 0.0–0.7)
Eosinophils Relative: 1 % (ref 0–5)
HCT: 40.7 % (ref 39.0–52.0)
Hemoglobin: 13.1 g/dL (ref 13.0–17.0)
LYMPHS ABS: 1.7 10*3/uL (ref 0.7–4.0)
LYMPHS PCT: 10 % — AB (ref 12–46)
MCH: 27.4 pg (ref 26.0–34.0)
MCHC: 32.2 g/dL (ref 30.0–36.0)
MCV: 85.1 fL (ref 78.0–100.0)
Monocytes Absolute: 1.6 10*3/uL — ABNORMAL HIGH (ref 0.1–1.0)
Monocytes Relative: 9 % (ref 3–12)
Neutro Abs: 13.6 10*3/uL — ABNORMAL HIGH (ref 1.7–7.7)
Neutrophils Relative %: 80 % — ABNORMAL HIGH (ref 43–77)
PLATELETS: 118 10*3/uL — AB (ref 150–400)
RBC: 4.78 MIL/uL (ref 4.22–5.81)
RDW: 17 % — ABNORMAL HIGH (ref 11.5–15.5)
WBC: 16.9 10*3/uL — AB (ref 4.0–10.5)

## 2014-03-05 LAB — AMMONIA: Ammonia: 35 umol/L (ref 11–60)

## 2014-03-05 LAB — COMPREHENSIVE METABOLIC PANEL
ALT: 879 U/L — ABNORMAL HIGH (ref 0–53)
AST: 1373 U/L — ABNORMAL HIGH (ref 0–37)
Albumin: 2.3 g/dL — ABNORMAL LOW (ref 3.5–5.2)
Alkaline Phosphatase: 195 U/L — ABNORMAL HIGH (ref 39–117)
BUN: 35 mg/dL — ABNORMAL HIGH (ref 6–23)
CALCIUM: 7.6 mg/dL — AB (ref 8.4–10.5)
CO2: 23 meq/L (ref 19–32)
CREATININE: 2.42 mg/dL — AB (ref 0.50–1.35)
Chloride: 116 mEq/L — ABNORMAL HIGH (ref 96–112)
GFR calc Af Amer: 28 mL/min — ABNORMAL LOW (ref >90)
GFR, EST NON AFRICAN AMERICAN: 24 mL/min — AB (ref >90)
GLUCOSE: 55 mg/dL — AB (ref 70–99)
Potassium: 3.3 mEq/L — ABNORMAL LOW (ref 3.7–5.3)
Sodium: 154 mEq/L — ABNORMAL HIGH (ref 137–147)
Total Bilirubin: 4.5 mg/dL — ABNORMAL HIGH (ref 0.3–1.2)
Total Protein: 6.1 g/dL (ref 6.0–8.3)

## 2014-03-05 LAB — GLUCOSE, CAPILLARY
GLUCOSE-CAPILLARY: 197 mg/dL — AB (ref 70–99)
GLUCOSE-CAPILLARY: 85 mg/dL (ref 70–99)
GLUCOSE-CAPILLARY: 166 mg/dL — AB (ref 70–99)
GLUCOSE-CAPILLARY: 118 mg/dL — AB (ref 70–99)
Glucose-Capillary: 50 mg/dL — ABNORMAL LOW (ref 70–99)
Glucose-Capillary: 82 mg/dL (ref 70–99)
Glucose-Capillary: 52 mg/dL — ABNORMAL LOW (ref 70–99)

## 2014-03-05 LAB — LACTATE DEHYDROGENASE: LDH: 1262 U/L — ABNORMAL HIGH (ref 94–250)

## 2014-03-05 LAB — HEPATITIS PANEL, ACUTE
HCV Ab: NEGATIVE
HEP B S AG: NEGATIVE
Hep A IgM: NONREACTIVE
Hep B C IgM: NONREACTIVE

## 2014-03-05 LAB — LACTIC ACID, PLASMA: LACTIC ACID, VENOUS: 2.1 mmol/L (ref 0.5–2.2)

## 2014-03-05 LAB — MAGNESIUM: Magnesium: 2.4 mg/dL (ref 1.5–2.5)

## 2014-03-05 LAB — CK: Total CK: 641 U/L — ABNORMAL HIGH (ref 7–232)

## 2014-03-05 LAB — HAPTOGLOBIN: Haptoglobin: 93 mg/dL (ref 45–215)

## 2014-03-05 MED ORDER — DEXTROSE 5 % IV SOLN
INTRAVENOUS | Status: DC
  Administered 2014-03-05: 23:00:00 via INTRAVENOUS

## 2014-03-05 MED ORDER — METOPROLOL TARTRATE 1 MG/ML IV SOLN
2.5000 mg | INTRAVENOUS | Status: DC | PRN
  Administered 2014-03-05: 2.5 mg via INTRAVENOUS
  Filled 2014-03-05: qty 5

## 2014-03-05 NOTE — Clinical Social Work Placement (Signed)
Clinical Social Work Department CLINICAL SOCIAL WORK PLACEMENT NOTE 03/05/2014  Patient:  Kosman,Ladarious  Account Number:  [id number] Admit date:  02/12/2014  Clinical Social Worker:  Lavell LusterJOSEPH BRYANT Georgi Navarrete, LCSWA  Date/time:  03/05/2014 04:25 PM  Clinical Social Work is seeking post-discharge placement for this patient at the following level of care:   SKILLED NURSING   (*CSW will update this form in Epic as items are completed)   03/05/2014  [facility]family provided with [facility] of Clinical [facility] Work's list of facilities offering this level of care within the geographic area requested by the patient (or if unable, by the patient's family).  03/05/2014  Patient/family informed of their freedom to choose among providers that offer the needed level of care, that participate in Medicare, Medicaid or managed care program needed by the patient, have an available bed and are willing to accept the patient.  03/05/2014  Patient/family informed of [facility]' ownership interest in [facility], as well as of the fact that they are under no obligation to receive care at this facility.  PASARR submitted to EDS on  PASARR number received on   FL2 transmitted to all facilities in geographic area requested by pt/family on  03/05/2014 FL2 transmitted to all facilities within larger geographic area on   Patient informed that his/her managed care company has contracts with or will negotiate with  certain facilities, including the following:     Patient/family informed of bed offers received:   Patient chooses bed at  Physician recommends and patient chooses bed at    Patient to be transferred to  on   Patient to be transferred to facility by  Patient and family notified of transfer on  Name of family member notified:    The following physician request were entered in Epic:   Additional Comments:   Roddie McBryant Daking Westervelt MSW, [place], [place], [phone]

## 2014-03-05 NOTE — Clinical Social Work Psychosocial (Signed)
Clinical Social Work Department BRIEF PSYCHOSOCIAL ASSESSMENT 03/05/2014  Patient:  Francisco Brock,Francisco Brock     Account Number:  [id number]     Admit date:  02/11/2014  Clinical Social Worker:  Lovey Newcomer  Date/Time:  03/05/2014 02:09 PM  Referred by:  Physician  Date Referred:  03/05/2014 Referred for  SNF Placement   Other Referral:   Interview type:  Family Other interview type:   Patient unable to contribute to assessment.    PSYCHOSOCIAL DATA Living Status:  FACILITY Admitted from facility:  [place] Level of care:  [facility] Primary support name:  Brayton Layman Primary support relationship to patient:  CHILD, ADULT Degree of support available:   Support is strong.    CURRENT CONCERNS Current Concerns  Post-Acute Placement   Other Concerns:    SOCIAL WORK ASSESSMENT / PLAN CSW met with patient and family at bedside and spoke with [facility] over the phone. Monica and family confirm that patient is from [facility] where he is a long term resident. At this time, the plan is for the patient to return to [facility] at discharge but the family would be interested in other long term placement options and requests that a referral be made. Family shows great concern for the patient and worries about how the care he receives at [facility] has changed.   Assessment/plan status:  Psychosocial Support/Ongoing Assessment of Needs Other assessment/ plan:   Complete FL2, Fax, PASRR   Information/referral to community resources:   CSW contact information given    PATIENT'S/FAMILY'S RESPONSE TO PLAN OF CARE: Patient's family plans for a SNF discharge, but still unsure if they will decide to go back to [facility] or choose a different facility. CSW will assist.       Liz Beach MSW, [place], [place], [phone]

## 2014-03-05 NOTE — Progress Notes (Signed)
Hypoglycemic Event  CBG: 50  Treatment: D50 IV 50 mL  Symptoms: None  Follow-up CBG: Time: 0437 CBG Result:197  Possible Reasons for Event: Other: NPO  Comments/MD notified:    Denni France  Remember to initiate Hypoglycemia Order Set & complete

## 2014-03-05 NOTE — Progress Notes (Signed)
[facility]  Francisco Brock UGQ:[id number] DOB: [birth year] DOA: 02/17/2014 PCP: Cyndee Brightly, MD  Time spent :  Brief narrative: 78 y.o. HM PMHx ischemic cardiomyopathy S./P. ICD, history CVA, diabetes type 2 with peripheral circulatory disorder uncontrolled, HTN, dyslipidemia.  brought in from NH at request of family extensive PMH as noted below. Patient has had a 2 week slow progressive but continuous mental decline. He is normally awake, talking and "lucid." He is now to the point where he is non-verbal for them (or at least difficult to understand). They state he has been being treated for an "infection" at the NH, but it not clear from records where the infection is or what ABx are being used. He reportedly was complaining of abdominal tenderness at the time of arrival.   He had a recent admission in March after he was found to have a left ICA stenosis and had stent assisted angioplasty during that admit.   Work up in the ED showed numerous abnormalities including urinary retention, foley catheter was placed and 1.1L drained.  HPI/Subjective: Awakens and able to convey has RUQ discomfort  Assessment/Plan:  ?? Severe sepsis with septic shock -source unclear-urine cx negative-unclear if was on anbx's pre admit (which would give false negative cx)- UA was mildly abnormal -repeat CXR 6/30 questions posterior to heart airspace disease -met the following criteria: hypothermia, hypotension,elevated lactic acid level, ARF, tachypnea, leukocytosis and hypoglycemia -BP has stabilized without further hypotension -Cortisol level appropriately elevated at 41.5 -Repeat lactic acid remained elevated at 2.7 but by 7/1 had normalized  -6/29: attending MD d/w daughter risk vs benefits of pressor rxn- primary concern given severe LV dysfunction with DD2 HF that pressors could worsen hypotension and lead to possible MI-MD also offered option of palliative  measures if family desires-family to meet and make decision -cont empiric broad spectrum anbx's - pt has h/o pseudomonas UTI recently   Shock liver -LFTs continue to trend up and now TB 4.5 which is nearly double compared to 6/30 -at admission CT abd and abd [place] without evidence of cholelithiasis or acute cholecystitis -suspect elevated LFTs from evolving shock liver ie enzymes in process of peaking -as precaution ck hepatitis panel, Anti- LKM -has thrombocytopenia so ck haptoglobin and LDH and dc heparin and ck HIT panel    Metabolic encephalopathy  -due to hypotension and infection; improving slowly -ammonia level 35 -exacerbated by prior CVA-? has FTT -will need SLP eval before allow diet    Acute respiratory failure with hypoxia -due to hypoventilation - no infiltrates on CXR and appears stable based on chronic changes that look like edema-FU CXR after hydration ordered today    Acute urinary retention -required foley and > 1000cc drained in ER -? A recurrent problem    Acute-on-chronic kidney injury/metabolic acidosis/hypernatremia -mild rhabdo- peak CPK 680 -BUN and Scr double from baseline and really haven't improved thus far- still has adequate UOP -cont Bicarb drip in sterile water -was on ACE I and Lasix pre admit so have held for now -suspect FTT contributory    CAD (coronary artery disease) -enzymes negative x 2 sets    Cardiomyopathy, ischemic -EF 15-20% -compensated without evidence of CHF -watch for volume overload  Hypernatremia -Continue Sodium bicarbonate in a sterile water 32m/hr -Check CMP in a.m.  Hypokalemia -Replete prn based on daily labs -magnesium level normal  Hypoglycemia -Dextrose 50% IV PRN for CBG< 60 -will increase maintenance IVF (D5W) to 50 ml/hr -  consider enteral feeds per tube if AMS persists      Type II or unspecified type diabetes mellitus with peripheral circulatory disorders -current CBGs actually D5W at 50 ml/hr -Monitor  closely for fluid overload -Continue PRN D50% -Continue sensitive SSI     Essential hypertension, benign/sinus tachycardia with PVC -Restart low-dose metoprolol 2.5 mg q4hr for tachycardia  -Monica aware that this is a very tricky situation with patient only now having adequate BP however necessary secondary to his extremely poor cardiac function    History of CVA (cerebrovascular accident) -once awake ck swallowing   DVT prophylaxis: Subcutaneous heparin Code Status: DO NOT RESUSCITATE Family Communication: 7/1 spoke with [facility] at length concerning patient's current status, plan of care, probable outcome. Daughter understood that patient still is gravely ill and would more than likely remain hospitalized over the weekend.  Disposition Plan/Expected LOS: Step down   Consultants: None   Procedures: None   Cultures 6/28 blood right/left antecubital NGTD 6/28 urine x2 negative 6/29 MRSA by PCR negative   Antibiotics: Zosyn 6/28 >>> Vancomycin 6/28 >>>  Objective: Blood pressure 109/61, pulse 51, temperature 96.5 F (35.8 C), temperature source Axillary, resp. rate 20, height 4' 9"  (1.448 m), weight 138 lb 3.7 oz (62.7 kg), SpO2 98.00%.  Intake/Output Summary (Last 24 hours) at 03/05/14 1501 Last data filed at 03/05/14 1100  Gross per 24 hour  Intake    705 ml  Output   1400 ml  Net   -695 ml     Exam: General: Somnolent, wakes to painful stimulation, answered simple yes/no question, Moves extremities spontaneously, No acute respiratory distress Lungs: Tachypnea, Clear to auscultation bilaterally without wheezes or crackles, RA Cardiovascular:Tachycardic, Regular rhythm without murmur gallop or rub normal S1 and S2, no peripheral edema or JVD Abdomen: Nontender, nondistended, soft, bowel sounds positive, no rebound, no ascites, no appreciable mass Musculoskeletal: No significant cyanosis, Rt AKA, Lt Thigh Erythematous, w/ Laceration   Scheduled Meds:  Scheduled  Meds: . insulin aspart  0-9 Units Subcutaneous 6 times per day  . piperacillin-tazobactam (ZOSYN)  IV  2.25 g Intravenous 4 times per day  . sodium chloride  3 mL Intravenous Q12H  . vancomycin  1,000 mg Intravenous Q48H   Continuous Infusions: . dextrose 5 % and 0.45% NaCl 40 mL/hr at 03/05/14 1253  . dextrose    .  sodium bicarbonate 150 mEq in sterile water 1000 mL infusion 75 mL/hr at 03/04/14 2253    Data Reviewed: Basic Metabolic Panel:  Recent Labs Lab 02/22/2014 1721 03/03/14 0420 03/03/14 1500 03/03/14 1532 03/04/14 0230 03/05/14 0328  NA 142 142 145  --  150* 154*  K 3.9 5.1 3.8  --  3.3* 3.3*  CL 104 108 115*  --  117* 116*  CO2 23 20 17*  --  17* 23  GLUCOSE 175* 68* 138*  --  75 55*  BUN 49* 44* 41*  --  39* 35*  CREATININE 2.43* 2.33* 2.23*  --  2.33* 2.42*  CALCIUM 8.1* 7.4* 7.2*  --  7.5* 7.6*  MG 2.8*  --   --  2.5  --  2.4   Liver Function Tests:  Recent Labs Lab 03/01/2014 1721 03/03/14 0420 03/04/14 0230 03/05/14 0328  AST 1190* 1242* 1287* 1373*  ALT 902* 894* 855* 879*  ALKPHOS 189* 171* 181* 195*  BILITOT 1.5* 1.9* 2.5* 4.5*  PROT 6.5 6.4 6.0 6.1  ALBUMIN 2.8* 2.6* 2.3* 2.3*    Recent Labs Lab 02/08/2014 1721  LIPASE 118*    Recent Labs Lab 03/03/14 2359 03/05/14 0910  AMMONIA 26 35   CBC:  Recent Labs Lab 02/14/2014 1721 03/03/14 0420 03/04/14 0230 03/05/14 0328  WBC 12.3* 18.1* 16.6* 16.9*  NEUTROABS 9.7*  --   --  13.6*  HGB 13.0 12.2* 12.7* 13.1  HCT 39.9 37.6* 40.0 40.7  MCV 84.2 84.3 84.7 85.1  PLT 145* 139* 145* 118*   Cardiac Enzymes:  Recent Labs Lab 02/13/2014 1721 03/03/14 0300 03/03/14 0420 03/03/14 0720 03/03/14 1500 03/04/14 0230 03/05/14 0328  CKTOTAL  --  680*  --   --   --  571* 641*  TROPONINI <0.30  --  <0.30 <0.30 <0.30  --   --    BNP (last 3 results)  Recent Labs  11/29/13 1500  PROBNP 2846.0*   CBG:  Recent Labs Lab 03/04/14 2302 03/05/14 0355 03/05/14 0437 03/05/14 0751  03/05/14 1112  GLUCAP 101* 50* 197* 82 52*    Recent Results (from the past 240 hour(s))  CULTURE, BLOOD (ROUTINE X 2)     Status: None   Collection Time    02/12/2014  5:11 PM      Result Value Ref Range Status   Specimen Description BLOOD RIGHT ANTECUBITAL   Final   Special Requests BOTTLES DRAWN AEROBIC AND ANAEROBIC 10CC EA   Final   Culture  Setup Time     Final   Value: 03/03/2014 01:49     Performed at [facility]   Culture     Final   Value:        BLOOD CULTURE RECEIVED NO GROWTH TO DATE CULTURE WILL BE HELD FOR 5 DAYS BEFORE ISSUING A FINAL NEGATIVE REPORT     Performed at [facility]   Report Status PENDING   Incomplete  CULTURE, BLOOD (ROUTINE X 2)     Status: None   Collection Time    02/20/2014  5:21 PM      Result Value Ref Range Status   Specimen Description BLOOD LEFT ANTECUBITAL   Final   Special Requests BOTTLES DRAWN AEROBIC ONLY 5CC   Final   Culture  Setup Time     Final   Value: 03/03/2014 01:49     Performed at [facility]   Culture     Final   Value:        BLOOD CULTURE RECEIVED NO GROWTH TO DATE CULTURE WILL BE HELD FOR 5 DAYS BEFORE ISSUING A FINAL NEGATIVE REPORT     Performed at [facility]   Report Status PENDING   Incomplete  URINE CULTURE     Status: None   Collection Time    02/18/2014  5:38 PM      Result Value Ref Range Status   Specimen Description URINE, CLEAN CATCH   Final   Special Requests NONE   Final   Culture  Setup Time     Final   Value: 03/03/2014 02:19     Performed at [place]     Final   Value: NO GROWTH     Performed at [facility]   Culture     Final   Value: NO GROWTH     Performed at [facility]   Report Status 03/04/2014 FINAL   Final  URINE CULTURE     Status: None   Collection Time    02/22/2014 11:34 PM      Result Value  Ref Range Status   Specimen Description URINE, CATHETERIZED   Final   Special Requests NONE   Final   Culture   Setup Time     Final   Value: 02/08/2014 23:55     Performed at [place]     Final   Value: NO GROWTH     Performed at [facility]   Culture     Final   Value: NO GROWTH     Performed at [facility]   Report Status 03/04/2014 FINAL   Final  MRSA PCR SCREENING     Status: None   Collection Time    03/03/14  5:24 AM      Result Value Ref Range Status   MRSA by PCR NEGATIVE  NEGATIVE Final   Comment:            The GeneXpert MRSA Assay (FDA     approved for NASAL specimens     only), is one component of a     comprehensive MRSA colonization     surveillance program. It is not     intended to diagnose MRSA     infection nor to guide or     monitor treatment for     MRSA infections.     Studies:  Recent x-ray studies have been reviewed in detail by the Attending Physician       Erin Hearing, ANP [facility] Office  [phone] Pager [phone]   **If unable to reach the above provider after paging please contact the [place] @ [phone]  On-Call/Text Page:      [email or web address]      password [email or web address]  If 7PM-7AM, please contact night-coverage [email or web address] Password [email or web address] 03/05/2014, 3:01 PM   LOS: 3 days   Examining patient and discussed assessment and plan with ANP Ebony Hail and agree with the above plan.  Patient with complex multiple medical problems, which required greater than 45 minutes in direct medical care.  Answered all patient's and family's questions concerning plan of care

## 2014-03-05 DEATH — deceased

## 2014-03-06 ENCOUNTER — Inpatient Hospital Stay (HOSPITAL_COMMUNITY): Payer: PRIVATE HEALTH INSURANCE

## 2014-03-06 ENCOUNTER — Encounter (HOSPITAL_COMMUNITY): Payer: Self-pay | Admitting: Radiology

## 2014-03-06 DIAGNOSIS — R740 Nonspecific elevation of levels of transaminase and lactic acid dehydrogenase [LDH]: Secondary | ICD-10-CM

## 2014-03-06 DIAGNOSIS — D72829 Elevated white blood cell count, unspecified: Secondary | ICD-10-CM

## 2014-03-06 DIAGNOSIS — R609 Edema, unspecified: Secondary | ICD-10-CM

## 2014-03-06 DIAGNOSIS — I959 Hypotension, unspecified: Secondary | ICD-10-CM

## 2014-03-06 DIAGNOSIS — R7401 Elevation of levels of liver transaminase levels: Secondary | ICD-10-CM

## 2014-03-06 DIAGNOSIS — R748 Abnormal levels of other serum enzymes: Secondary | ICD-10-CM

## 2014-03-06 LAB — GLUCOSE, CAPILLARY
GLUCOSE-CAPILLARY: 108 mg/dL — AB (ref 70–99)
GLUCOSE-CAPILLARY: 68 mg/dL — AB (ref 70–99)
Glucose-Capillary: 85 mg/dL (ref 70–99)
Glucose-Capillary: 89 mg/dL (ref 70–99)
Glucose-Capillary: 63 mg/dL — ABNORMAL LOW (ref 70–99)

## 2014-03-06 LAB — CBC WITH DIFFERENTIAL/PLATELET
BASOS ABS: 0 10*3/uL (ref 0.0–0.1)
BASOS PCT: 0 % (ref 0–1)
EOS PCT: 1 % (ref 0–5)
Eosinophils Absolute: 0.2 10*3/uL (ref 0.0–0.7)
HEMATOCRIT: 38.2 % — AB (ref 39.0–52.0)
Hemoglobin: 12.9 g/dL — ABNORMAL LOW (ref 13.0–17.0)
LYMPHS ABS: 2.6 10*3/uL (ref 0.7–4.0)
LYMPHS PCT: 15 % (ref 12–46)
MCH: 27.6 pg (ref 26.0–34.0)
MCHC: 33.8 g/dL (ref 30.0–36.0)
MCV: 81.8 fL (ref 78.0–100.0)
MONOS PCT: 12 % (ref 3–12)
Monocytes Absolute: 2.1 10*3/uL — ABNORMAL HIGH (ref 0.1–1.0)
NEUTROS ABS: 12.6 10*3/uL — AB (ref 1.7–7.7)
Neutrophils Relative %: 72 % (ref 43–77)
Platelets: 113 10*3/uL — ABNORMAL LOW (ref 150–400)
RBC: 4.67 MIL/uL (ref 4.22–5.81)
RDW: 17.2 % — ABNORMAL HIGH (ref 11.5–15.5)
WBC: 17.5 10*3/uL — ABNORMAL HIGH (ref 4.0–10.5)

## 2014-03-06 LAB — BLOOD GAS, ARTERIAL
ACID-BASE EXCESS: 4.4 mmol/L — AB (ref 0.0–2.0)
Bicarbonate: 27.2 mEq/L — ABNORMAL HIGH (ref 20.0–24.0)
Drawn by: 21338
O2 CONTENT: 2 L/min
O2 Saturation: 95.7 %
PATIENT TEMPERATURE: 98.8
TCO2: 28.1 mmol/L (ref 0–100)
pCO2 arterial: 32.2 mmHg — ABNORMAL LOW (ref 35.0–45.0)
pH, Arterial: 7.537 — ABNORMAL HIGH (ref 7.350–7.450)
pO2, Arterial: 66.6 mmHg — ABNORMAL LOW (ref 80.0–100.0)

## 2014-03-06 LAB — COMPREHENSIVE METABOLIC PANEL
ALBUMIN: 2.3 g/dL — AB (ref 3.5–5.2)
ALT: 797 U/L — AB (ref 0–53)
AST: 1294 U/L — ABNORMAL HIGH (ref 0–37)
Alkaline Phosphatase: 169 U/L — ABNORMAL HIGH (ref 39–117)
Anion gap: 16 — ABNORMAL HIGH (ref 5–15)
BUN: 37 mg/dL — ABNORMAL HIGH (ref 6–23)
CALCIUM: 7.7 mg/dL — AB (ref 8.4–10.5)
CO2: 28 meq/L (ref 19–32)
CREATININE: 2.8 mg/dL — AB (ref 0.50–1.35)
Chloride: 112 mEq/L (ref 96–112)
GFR calc Af Amer: 24 mL/min — ABNORMAL LOW (ref >90)
GFR, EST NON AFRICAN AMERICAN: 20 mL/min — AB (ref >90)
Glucose, Bld: 101 mg/dL — ABNORMAL HIGH (ref 70–99)
Potassium: 3.8 mEq/L (ref 3.7–5.3)
SODIUM: 156 meq/L — AB (ref 137–147)
Total Bilirubin: 6 mg/dL — ABNORMAL HIGH (ref 0.3–1.2)
Total Protein: 6.3 g/dL (ref 6.0–8.3)

## 2014-03-06 LAB — CK
CK TOTAL: 7372 U/L — AB (ref 7–232)
Total CK: 3639 U/L — ABNORMAL HIGH (ref 7–232)

## 2014-03-06 LAB — SEDIMENTATION RATE: SED RATE: 5 mm/h (ref 0–16)

## 2014-03-06 LAB — T3: T3 TOTAL: 32.7 ng/dL — AB (ref 80.0–204.0)

## 2014-03-06 LAB — T4, FREE: Free T4: 0.96 ng/dL (ref 0.80–1.80)

## 2014-03-06 LAB — MAGNESIUM: Magnesium: 2.4 mg/dL (ref 1.5–2.5)

## 2014-03-06 LAB — TSH: TSH: 2.79 u[IU]/mL (ref 0.350–4.500)

## 2014-03-06 LAB — PROCALCITONIN: Procalcitonin: 4.1 ng/mL

## 2014-03-06 MED ORDER — CEFAZOLIN SODIUM 1-5 GM-% IV SOLN
1.0000 g | Freq: Two times a day (BID) | INTRAVENOUS | Status: DC
  Administered 2014-03-06: 1 g via INTRAVENOUS
  Filled 2014-03-06 (×2): qty 50

## 2014-03-06 MED ORDER — VANCOMYCIN HCL IN DEXTROSE 1-5 GM/200ML-% IV SOLN
1000.0000 mg | INTRAVENOUS | Status: DC
  Administered 2014-03-06: 1000 mg via INTRAVENOUS
  Filled 2014-03-06: qty 200

## 2014-03-06 MED ORDER — BIOTENE DRY MOUTH MT LIQD
15.0000 mL | Freq: Two times a day (BID) | OROMUCOSAL | Status: DC
  Administered 2014-03-06 – 2014-03-09 (×4): 15 mL via OROMUCOSAL

## 2014-03-06 MED ORDER — CHLORHEXIDINE GLUCONATE 0.12 % MT SOLN
15.0000 mL | Freq: Two times a day (BID) | OROMUCOSAL | Status: DC
  Administered 2014-03-06 – 2014-03-09 (×7): 15 mL via OROMUCOSAL
  Filled 2014-03-06 (×9): qty 15

## 2014-03-06 MED ORDER — DEXTROSE 50 % IV SOLN
INTRAVENOUS | Status: AC
  Administered 2014-03-06: 50 mL
  Filled 2014-03-06: qty 50

## 2014-03-06 MED ORDER — DEXTROSE 5 % IV SOLN
1.0000 g | INTRAVENOUS | Status: DC
  Administered 2014-03-06: 1 g via INTRAVENOUS
  Filled 2014-03-06 (×2): qty 1

## 2014-03-06 MED ORDER — SODIUM CHLORIDE 0.9 % IV BOLUS (SEPSIS)
250.0000 mL | Freq: Once | INTRAVENOUS | Status: AC
  Administered 2014-03-06: 250 mL via INTRAVENOUS

## 2014-03-06 NOTE — Progress Notes (Signed)
Dr. Norva RiffleFelliz-Ortiz notified of patient's low BP, decreased UOP, and increased O2 demands. Stated to continue to monitor at this time. Will call if changes.

## 2014-03-06 NOTE — Progress Notes (Signed)
ANTIBIOTIC CONSULT NOTE - INITIAL  Pharmacy Consult for Vancomycin Indication: Sepsis  No Known Allergies  Patient Measurements: Height: 4\' 9"  (144.8 cm) Weight: 138 lb 3.7 oz (62.7 kg) IBW/kg (Calculated) : 43.1 Adjusted Body Weight:    Vital Signs: Temp: 101.5 F (38.6 C) (07/02 2006) Temp src: Rectal (07/02 2006) BP: 92/38 mmHg (07/02 2045) Pulse Rate: 92 (07/02 2045) Intake/Output from previous day: 07/01 0701 - 07/02 0700 In: 1522.3 [I.V.:1422.3; IV Piggyback:100] Out: 1050 [Urine:1050] Intake/Output from this shift: Total I/O In: -  Out: 75 [Urine:75]  Labs:  Recent Labs  03/04/14 0230 03/05/14 0328 03/06/14 0230  WBC 16.6* 16.9* 17.5*  HGB 12.7* 13.1 12.9*  PLT 145* 118* 113*  CREATININE 2.33* 2.42* 2.80*   Estimated Creatinine Clearance: 16.2 ml/min (by C-G formula based on Cr of 2.8). No results found for this basename: VANCOTROUGH, VANCOPEAK, VANCORANDOM, GENTTROUGH, GENTPEAK, GENTRANDOM, TOBRATROUGH, TOBRAPEAK, TOBRARND, AMIKACINPEAK, AMIKACINTROU, AMIKACIN,  in the last 72 hours   Microbiology: Recent Results (from the past 720 hour(s))  CULTURE, BLOOD (ROUTINE X 2)     Status: None   Collection Time    10-24-13  5:11 PM      Result Value Ref Range Status   Specimen Description BLOOD RIGHT ANTECUBITAL   Final   Special Requests BOTTLES DRAWN AEROBIC AND ANAEROBIC 10CC EA   Final   Culture  Setup Time     Final   Value: 03/03/2014 01:49     [facility]formed [facility]   Culture     Final   Value:        BLOOD CULTURE RECEIVED NO GROWTH TO DATE CULTURE WILL BE HELD FOR 5 DAYS BEFORE ISSUING A FINAL NEGATIVE REPORT     Performed at [facility]   Report Status PENDING   Incomplete  CULTURE, BLOOD (ROUTINE X 2)     Status: None   Collection Time    10-24-13  5:21 PM      Result Value Ref Range Status   Specimen Description BLOOD LEFT ANTECUBITAL   Final   Special Requests BOTTLES DRAWN AEROBIC ONLY 5CC   Final   Culture  Setup Time      Final   Value: 03/03/2014 01:49     Performed at [facility]   Culture     Final   Value:        BLOOD CULTURE RECEIVED NO GROWTH TO DATE CULTURE WILL BE HELD FOR 5 DAYS BEFORE ISSUING A FINAL NEGATIVE REPORT     Performed at [facility]   Report Status PENDING   Incomplete  URINE CULTURE     Status: None   Collection Time    10-24-13  5:38 PM      Result Value Ref Range Status   Specimen Description URINE, CLEAN CATCH   Final   Special Requests NONE   Final   Culture  Setup Time     Final   Value: 03/03/2014 02:19     Performed at [facility]   Colony Count     Final   Value: NO GROWTH     Performed at [facility]   Culture     Final   Value: NO GROWTH     Performed at [facility]   Report Status 03/04/2014 FINAL   Final  URINE CULTURE     Status: None   Collection Time    10-24-13 11:34 PM      Result  Value Ref Range Status   Specimen Description URINE, CATHETERIZED   Final   Special Requests NONE   Final   Culture  Setup Time     Final   Value: 2014-03-11 23:55     Performed at [facility]   Colony Count     Final   Value: NO GROWTH     Performed at [facility]   Culture     Final   Value: NO GROWTH     Performed at [facility]   Report Status 03/04/2014 FINAL   Final  MRSA PCR SCREENING     Status: None   Collection Time    03/03/14  5:24 AM      Result Value Ref Range Status   MRSA by PCR NEGATIVE  NEGATIVE Final   Comment:            The GeneXpert MRSA Assay (FDA     approved for NASAL specimens     only), is one component of a     comprehensive MRSA colonization     surveillance program. It is not     intended to diagnose MRSA     infection nor to guide or     monitor treatment for     MRSA infections.    Medical History: Past Medical History  Diagnosis Date  . CAD (coronary artery disease)     a. s/p CABG 1998.  . Subdural hematoma     a. TBI 2011.  . 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 Cardiac defibrillator in place   . Chronic systolic CHF (congestive heart failure)     a. ICM.  Marland Kitchen. Diabetes mellitus without complication   . Hypertension   . Hyperlipemia   . Peripheral vascular disease   . COPD (chronic obstructive pulmonary disease)   . VT (ventricular tachycardia)     a. s/p Medtronic ICD 2007, nearing ERI but patient had declined ERI changeout.  . Stroke 1998    no residual  . Automatic implantable cardioverter-defibrillator in situ   . Seizures     last one maybe 2012  . History of blood transfusion   . DVT (deep venous thrombosis)   . 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 BC still pending  Vanc 6/28>> Cefepime 7/2>> Zosyn 6/28>>7/2 Cefazolin 7/2>>7/2  6/28 Urine x 2 - NEG 6/28 Blood x 2 - NGTD   Goal of Therapy:  Vancomycin trough level 15-20 mcg/ml  Plan:  Continue Vancomycin at 1g IV q48h. Cefepime 1g IV q24h.  Caileen Veracruz S. Merilynn Finlandobertson, PharmD, BCPS Clinical Staff Pharmacist Pager [phone]  Misty Stanleyobertson, Karenann Mcgrory Stillinger 03/06/2014,9:00 PM

## 2014-03-06 NOTE — Clinical Social Work Placement (Signed)
Clinical Social Work Department CLINICAL SOCIAL WORK PLACEMENT NOTE 03/06/2014  Patient:  Francisco Brock,Francisco Brock  Account Number:  [id number] Admit date:  02/15/2014  Clinical Social Worker:  Lavell LusterJOSEPH BRYANT Sonda Coppens, LCSWA  Date/time:  03/05/2014 04:25 PM  Clinical Social Work is seeking post-discharge placement for this patient at the following level of care:   SKILLED NURSING   (*CSW will update this form in Epic as items are completed)   03/05/2014  [facility]family provided with [facility] of [facility] Work's list of facilities offering this level of care within the geographic area requested by the patient (or if unable, by the patient's family).  03/05/2014  Patient/family informed of their freedom to choose among providers that offer the needed level of care, that participate in Medicare, Medicaid or managed care program needed by the patient, have an available bed and are willing to accept the patient.  03/05/2014  Patient/family informed of [facility]' ownership interest in [facility], as well as of the fact that they are under no obligation to receive care at this facility.  PASARR submitted to EDS on  PASARR number received on   FL2 transmitted to all facilities in geographic area requested by pt/family on  03/05/2014 FL2 transmitted to all facilities within larger geographic area on   Patient informed that his/her managed care company has contracts with or will negotiate with  certain facilities, including the following:     Patient/family informed of bed offers received:  03/06/2014 Patient chooses bed at  Physician recommends and patient chooses bed at    Patient to be transferred to  on   Patient to be transferred to facility by  Patient and family notified of transfer on  Name of family member notified:    The following physician request were entered in Epic:   Additional Comments:   Roddie McBryant Court Gracia MSW, ArdenLCSWA, [place]ASA, [phone]

## 2014-03-06 NOTE — Consult Note (Addendum)
[facility]     Reason for Consult: ? infection    Referring Physician: Dr. Joseph ArtWoods  Active Problems:   Dyslipidemia   Type II or unspecified type diabetes mellitus with peripheral circulatory disorders, uncontrolled(250.72)   Essential hypertension, benign   CAD (coronary artery disease)   History of CVA (cerebrovascular accident)   Cardiomyopathy, ischemic   ICD (implantable cardioverter-defibrillator) in place   Shock liver   Metabolic encephalopathy   Acute-on-chronic kidney injury   Acute urinary retention   Severe sepsis with septic shock   Acute respiratory failure with hypoxia   Metabolic acidosis   . antiseptic oral rinse  15 mL Mouth Rinse q12n4p  . chlorhexidine  15 mL Mouth Rinse BID  . piperacillin-tazobactam (ZOSYN)  IV  2.25 g Intravenous 4 times per day  . sodium chloride  3 mL Intravenous Q12H  . vancomycin  1,000 mg Intravenous Q48H    Recommendations: I will d/c zosyn, doubt pseudomonal involvment  I will change vancomycin to ancef, no evidence of purulence to suggest MRSA cellulitis.  Thanks for consult, will continue to follow  Assessment: He has leukocytosis and hypotension in the setting of EF of 15-20%.  Difficult to ascertain if related to infection or low EF.  Cultures all have been negative, though with antibiotics previously for ?UTI.   UA though was negative on admission so does not suggest UTi as cause of leukocytosis, AMS, now.  Has hypernatremia.  Only potential source for infection would be mild cellulitis on left stump, possible pneumonia but difficult to determine with poor EF and edema.  Imaging of leg shows mild edema, no deep involvement on leg.  I worry about developing C diff with recent antibiotics but no diarrhea presently per nursing.   Transaminitis of unclear etiology.  Drug effect, shock liver. Procalcitonin not validated with severe CHF.    Antibiotics: vancomcycin day 5 Zosyn day 5  HPI: Carma LairBenigno  Reiland is a 78 y.o. male with recent left ICA stenosis and stent assisted angioplasty, bradycardia, recent CHF exacerbation and presumed UTI with pansensitive Pseudomonas who came in now with AMS that has been progressive in nature over 2 weeks and non verbal.  Some abdominal pain on admission.  He was noted to have urinary retention with 1.1 L with foley placement.  UA on admission was negative for significant leukocytes and nitrites, blood cultures have remained negative, and he recently has had a declining EF to 15-20%.  ABG with hypoxia and lactate up.     Review of Systems: Review of systems not obtained due to patient factors.  Past Medical History  Diagnosis Date  . CAD (coronary artery disease)     a. s/p CABG 1998.  . Subdural hematoma     a. TBI 2011.  . 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 Cardiac defibrillator in place   . Chronic systolic CHF (congestive heart failure)     a. ICM.  Marland Kitchen. Diabetes mellitus without complication   . Hypertension   . Hyperlipemia   . Peripheral vascular disease   . COPD (chronic obstructive pulmonary disease)   . VT (ventricular tachycardia)     a. s/p Medtronic ICD 2007, nearing ERI but patient had declined ERI changeout.  . Stroke 1998    no residual  . Automatic implantable cardioverter-defibrillator in situ   . Seizures     last one maybe 2012  . History  of blood transfusion   . DVT (deep venous thrombosis)   . 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 Smoking status: Former Smoker    Types: Cigars  . Smokeless tobacco: Not on file  . Alcohol Use: No    History reviewed. No pertinent family history. No Known Allergies  OBJECTIVE: Blood pressure 105/73, pulse 100, temperature 97.9 F (36.6 C), temperature source Axillary, resp. rate 30, height 4\' 9"  (1.448 m), weight 138 lb 3.7 oz (62.7 kg), SpO2 94.00%. General: awake, does respond to simple commands Skin: left  stump with some mild erythema and warmth Lungs: diffuse rhonchi Cor: brady with RR Abdomen: soft, nt, nd, +normoactive bowel sounds Ext: no edema bilateral AKA  Microbiology: Recent Results (from the past 240 hour(s))  CULTURE, BLOOD (ROUTINE X 2)     Status: None   Collection Time    02/12/2014  5:11 PM      Result Value Ref Range Status   Specimen Description BLOOD RIGHT ANTECUBITAL   Final   Special Requests BOTTLES DRAWN AEROBIC AND ANAEROBIC 10CC EA   Final   Culture  Setup Time     Final   Value: 03/03/2014 01:49     Performed at [facility]   Culture     Final   Value:        BLOOD CULTURE RECEIVED NO GROWTH TO DATE CULTURE WILL BE HELD FOR 5 DAYS BEFORE ISSUING A FINAL NEGATIVE REPORT     Performed at [facility]   Report Status PENDING   Incomplete  CULTURE, BLOOD (ROUTINE X 2)     Status: None   Collection Time    02/11/2014  5:21 PM      Result Value Ref Range Status   Specimen Description BLOOD LEFT ANTECUBITAL   Final   Special Requests BOTTLES DRAWN AEROBIC ONLY 5CC   Final   Culture  Setup Time     Final   Value: 03/03/2014 01:49     Performed at [facility]   Culture     Final   Value:        BLOOD CULTURE RECEIVED NO GROWTH TO DATE CULTURE WILL BE HELD FOR 5 DAYS BEFORE ISSUING A FINAL NEGATIVE REPORT     Performed at [facility]   Report Status PENDING   Incomplete  URINE CULTURE     Status: None   Collection Time    02/23/2014  5:38 PM      Result Value Ref Range Status   Specimen Description URINE, CLEAN CATCH   Final   Special Requests NONE   Final   Culture  Setup Time     Final   Value: 03/03/2014 02:19     Performed at [facility] Count     Final   Value: NO GROWTH     Performed at [facility]   Culture     Final   Value: NO GROWTH     Performed at [facility]   Report Status 03/04/2014 FINAL   Final  URINE CULTURE     Status: None   Collection Time    02/19/2014 11:34 PM       Result Value Ref Range Status   Specimen Description URINE, CATHETERIZED   Final   Special Requests NONE   Final   Culture  Setup Time  Final   Value: 02/16/2014 23:55     [facility]   [place] Count     Final   Value: NO GROWTH     [facility]   Culture     Final   Value: NO GROWTH     [facility]   Report Status 03/04/2014 FINAL   Final  MRSA PCR SCREENING     Status: None   Collection Time    03/03/14  5:24 AM      Result Value Ref Range Status   MRSA by PCR NEGATIVE  NEGATIVE Final   Comment:            The GeneXpert MRSA Assay (FDA     approved for NASAL specimens     only), is one component of a     comprehensive MRSA colonization     surveillance program. It is not     intended to diagnose MRSA     infection nor to guide or     monitor treatment for     MRSA infections.    Staci RighterOMER, Janya Eveland, MD [facility] [email or web address] [phone] pager  [phone] cell 03/06/2014, 12:47 PM

## 2014-03-06 NOTE — Care Management Note (Unsigned)
    Page 1 of 1   03/06/2014     10:15:42 AM CARE MANAGEMENT NOTE 03/06/2014  Patient:  Francisco Brock,Francisco Brock   Account Number:  [id number]  Date Initiated:  03/03/2014  Documentation initiated by:  Donn PieriniWEBSTER,KRISTI  Subjective/Objective Assessment:   Pt admitted with AMS     Action/Plan:   [facility]A [facility]   Anticipated DC Date:  03/06/2014   Anticipated DC Plan:  SKILLED NURSING FACILITY  In-house referral  Clinical Social Worker      DC Planning Services  CM consult      Choice offered to / List presented to:             Status of service:  In process, will continue to follow Medicare Important Message given?  YES (If response is "NO", the following Medicare IM given date fields will be blank) Date Medicare IM given:  03/06/2014 Medicare IM given by:  GRAVES-BIGELOW,Havard Radigan Date Additional Medicare IM given:   Additional Medicare IM given by:    Discharge Disposition:    Per UR Regulation:  Reviewed for med. necessity/level of care/duration of stay  If discussed at Long Length of Stay Meetings, dates discussed:    Comments:

## 2014-03-06 NOTE — Progress Notes (Signed)
ANTIBIOTIC CONSULT NOTE - Follow-up  Pharmacy Consult for cefazolin Indication: cellulitis  No Known Allergies  Vital Signs: Temp: 98 F (36.7 C) (07/02 1300) Temp src: Axillary (07/02 1300) BP: 105/73 mmHg (07/02 0323) Pulse Rate: 100 (07/02 0323)  Labs:  Recent Labs  03/04/14 0230 03/05/14 0328 03/06/14 0230  WBC 16.6* 16.9* 17.5*  HGB 12.7* 13.1 12.9*  PLT 145* 118* 113*  CREATININE 2.33* 2.42* 2.80*   Assessment: 78 yom previously on broad-spectrum antibiotics now transitioning to cefazolin for cellulitis treatment. Scr has been slowly trending up and today is 2.8. WBC is elevated at 17.5 but pt remains afebrile.   Vanc 6/28>>7/2 Zosyn 6/28>>7/2 Cefazolin 7/2>>  6/28 Urine x 2 - NEG 6/28 Blood x 2 - NGTD  Plan:  1. Cefazolin 1gm IV Q12H 2. F/u renal fxn, C&S, clinical status and LOT  Lysle Pearlachel Venus Gilles, PharmD, BCPS Pager # [phone] 03/06/2014 1:21 PM

## 2014-03-06 NOTE — Progress Notes (Signed)
Hypoglycemic Event  CBG: 64  Treatment: D50 IV 50 mL  Symptoms: None  Follow-up CBG: Time:2210 CBG Result:181  Possible Reasons for Event: Inadequate meal intake, IVF stopped d/t loss of IV access, restarted once access obtained- MD aware.  Comments/MD notified: Recurrent problem, MDs aware    Windell Momenteanna Zayde Stroupe, RN Remember to initiate Hypoglycemia Order Set & complete

## 2014-03-06 NOTE — H&P (Signed)
BP 86/17  Pulse 107  Temp(Src) 101.5 F (38.6 C) (Rectal)  Resp 32  Ht 4\' 9"  (1.448 m)  Wt 62.7 kg (138 lb 3.7 oz)  BMI 29.90 kg/m2  SpO2 94% - Got a call around 8:30 from the nurse the patient spiking fevers and hypotensive his antibiotics were changed today from vancomycin and Zosyn to Ancef, he started having fevers blood pressure went to the 80s. 250 she bolus of normal saline. And start him back on Bank and cefepime per pharmacy.

## 2014-03-06 NOTE — Progress Notes (Signed)
Dr. David StallFeliz-Ortiz notified of patient's low BP, temp 101.5, and tachypnea. New orders received. Discussed attempt to contact patient's family, but telephone number goes to voicemail. Will continue to closely monitor and attempt to contact patient's family.

## 2014-03-06 NOTE — Progress Notes (Signed)
[facility]  Francisco Brock YTK:[id number] DOB: 1936-04-15 DOA: 03/03/2014 PCP: Cyndee Brightly, MD  Time spent :  Brief narrative: 78 y.o. HM PMHx ischemic cardiomyopathy S./P. ICD, history CVA, diabetes type 2 with peripheral circulatory disorder uncontrolled, HTN, dyslipidemia.  brought in from NH at request of family extensive PMH as noted below. Patient has had a 2 week slow progressive but continuous mental decline. He is normally awake, talking and "lucid." He is now to the point where he is non-verbal for them (or at least difficult to understand). They state he has been being treated for an "infection" at the NH, but it not clear from records where the infection is or what ABx are being used. He reportedly was complaining of abdominal tenderness at the time of arrival.   He had a recent admission in March after he was found to have a left ICA stenosis and had stent assisted angioplasty during that admit.   Work up in the ED showed numerous abnormalities including urinary retention, foley catheter was placed and 1.1L drained.  HPI/Subjective: More lethargic-does not open eyes  Assessment/Plan:  ?? Severe sepsis with septic shock -source unclear-urine cx negative-unclear if was on anbx's pre admit (which would give false negative cx)- UA was mildly abnormal -repeat CXR 6/30 questions posterior to heart airspace disease -met the following criteria: hypothermia, hypotension,elevated lactic acid level, ARF, tachypnea, leukocytosis and hypoglycemia -BP has stabilized without further hypotension -Cortisol level appropriately elevated at 41.5 -Repeat lactic acid remained elevated at 2.7 but by 7/1 had normalized  -cont empiric broad spectrum anbx's - pt has h/o pseudomonas UTI recently -PCT 4.10-ESR 5 -WBC remains up and with unexplained marked elevation of CPK and ? Early Fornier's (? Focal edema left scrotum) will obtain ID consult   Shock  liver -LFTs continue to trend up and now TB 4.5 which is nearly double compared to 6/30 -at admission CT abd and abd [place] without evidence of cholelithiasis or acute cholecystitis -suspect elevated LFTs from evolving shock liver ie enzymes in process of peaking noting that AST/ALT are trending down as TB trends up which is more c/w resolving shock etiology with likely superimposed hemolytic process due to elevations in CPK -as precaution ck hepatitis panel, Anti- LKM -has thrombocytopenia so ck haptoglobin and LDH and dc heparin and ck HIT panel  Elevated CPK -? Etiology: sepsis vs myositis -CPK has tripled in past 24 hrs-ck every 12 hrs with next due at 3 pm -TSH normal-ESR 5 -pt has abrasion on left thigh but CT c/w cellulitis and not myositis or necrotizing fasciitis -radiologist question edema of left scrotum which could be marker of early Fornier's so will monitor closely    Metabolic encephalopathy  -due to hypotension and infection; improving slowly -ammonia level 35 -exacerbated by prior CVA-? has FTT -will need SLP eval before allow diet    Acute respiratory failure with hypoxia -due to hypoventilation - no infiltrates on CXR and appears stable based on chronic changes that look like edema-FU CXR after hydration ordered today    Acute urinary retention -required foley and > 1000cc drained in ER -? A recurrent problem    Acute-on-chronic kidney injury/metabolic acidosis/hypernatremia -mild rhabdo- peak CPK 680 -BUN and Scr double from baseline and really haven't improved thus far- still has adequate UOP -ABG shows alkalosis so will dc bicarb drip  -was on ACE I and Lasix pre admit so have held for now -suspect FTT contributory  CAD (coronary artery disease) -enzymes negative x 3 sets    Cardiomyopathy, ischemic -EF 15-20% -compensated without evidence of CHF -watch for volume overload  Hypernatremia -not improving -cont dextrose IVFs at  50/hr  Hypokalemia -Replete prn based on daily labs -magnesium level normal  Hypoglycemia -Dextrose 50% IV PRN for CBG< 60 -cont maintenance D5W at 50/hr -consider enteral feeds per tube if AMS persists      Type II or unspecified type diabetes mellitus with peripheral circulatory disorders -current CBGs actually D5W at 50 ml/hr -Monitor closely for fluid overload -Continue PRN D50% -dc SSI for now    Essential hypertension, benign/sinus tachycardia with PVC -Restart low-dose metoprolol 2.5 mg q4hr for tachycardia  -Monica aware that this is a very tricky situation with patient only now having adequate BP however necessary secondary to his extremely poor cardiac function    History of CVA (cerebrovascular accident) -once awake ck swallowing   DVT prophylaxis: Subcutaneous heparin Code Status: DO NOT RESUSCITATE Family Communication: None present   Disposition Plan/Expected LOS: Step down   Consultants: ID   Procedures: None   Cultures 6/28 blood right/left antecubital NGTD 6/28 urine x2 negative 6/29 MRSA by PCR negative   Antibiotics: Zosyn 6/28 >>> Vancomycin 6/28 >>>  Objective: Blood pressure 105/73, pulse 100, temperature 97.9 F (36.6 C), temperature source Axillary, resp. rate 30, height 4' 9"  (1.448 m), weight 138 lb 3.7 oz (62.7 kg), SpO2 94.00%.  Intake/Output Summary (Last 24 hours) at 03/06/14 1235 Last data filed at 03/06/14 0725  Gross per 24 hour  Intake 1522.33 ml  Output    825 ml  Net 697.33 ml     Exam: General: Somnolent, wakes to painful stimulation, answered simple yes/no question, Moves extremities spontaneously, No acute respiratory distress Lungs: Tachypnea, Clear to auscultation bilaterally without wheezes or crackles, RA Cardiovascular:Tachycardic, Regular rhythm without murmur gallop or rub normal S1 and S2, no peripheral edema or JVD Abdomen: Nontender, nondistended, soft, bowel sounds positive, no rebound, no ascites, no  appreciable mass Musculoskeletal: No significant cyanosis, Rt AKA, Lt Thigh Erythematous w/abrasion- not draining but skin with cellulitic changes surrounding the entire abrasion as well as multiple scattered (sattelite) irregular areas of redness around the rest of the leg  Scheduled Meds:  Scheduled Meds: . antiseptic oral rinse  15 mL Mouth Rinse q12n4p  . chlorhexidine  15 mL Mouth Rinse BID  . piperacillin-tazobactam (ZOSYN)  IV  2.25 g Intravenous 4 times per day  . sodium chloride  3 mL Intravenous Q12H  . vancomycin  1,000 mg Intravenous Q48H   Continuous Infusions: . dextrose 50 mL/hr at 03/05/14 2244  . dextrose    .  sodium bicarbonate 150 mEq in sterile water 1000 mL infusion 75 mL/hr at 03/06/14 0413    Data Reviewed: Basic Metabolic Panel:  Recent Labs Lab 02/27/2014 1721 03/03/14 0420 03/03/14 1500 03/03/14 1532 03/04/14 0230 03/05/14 0328 03/06/14 0230  NA 142 142 145  --  150* 154* 156*  K 3.9 5.1 3.8  --  3.3* 3.3* 3.8  CL 104 108 115*  --  117* 116* 112  CO2 23 20 17*  --  17* 23 28  GLUCOSE 175* 68* 138*  --  75 55* 101*  BUN 49* 44* 41*  --  39* 35* 37*  CREATININE 2.43* 2.33* 2.23*  --  2.33* 2.42* 2.80*  CALCIUM 8.1* 7.4* 7.2*  --  7.5* 7.6* 7.7*  MG 2.8*  --   --  2.5  --  2.4 2.4   Liver Function Tests:  Recent Labs Lab 02/19/2014 1721 03/03/14 0420 03/04/14 0230 03/05/14 0328 03/06/14 0230  AST 1190* 1242* 1287* 1373* 1294*  ALT 902* 894* 855* 879* 797*  ALKPHOS 189* 171* 181* 195* 169*  BILITOT 1.5* 1.9* 2.5* 4.5* 6.0*  PROT 6.5 6.4 6.0 6.1 6.3  ALBUMIN 2.8* 2.6* 2.3* 2.3* 2.3*    Recent Labs Lab 02/13/2014 1721  LIPASE 118*    Recent Labs Lab 03/03/14 2359 03/05/14 0910  AMMONIA 26 35   CBC:  Recent Labs Lab 02/13/2014 1721 03/03/14 0420 03/04/14 0230 03/05/14 0328 03/06/14 0230  WBC 12.3* 18.1* 16.6* 16.9* 17.5*  NEUTROABS 9.7*  --   --  13.6* 12.6*  HGB 13.0 12.2* 12.7* 13.1 12.9*  HCT 39.9 37.6* 40.0 40.7 38.2*   MCV 84.2 84.3 84.7 85.1 81.8  PLT 145* 139* 145* 118* 113*   Cardiac Enzymes:  Recent Labs Lab 02/08/2014 1721 03/03/14 0300 03/03/14 0420 03/03/14 0720 03/03/14 1500 03/04/14 0230 03/05/14 0328 03/06/14 0230  CKTOTAL  --  680*  --   --   --  571* 641* 3639*  TROPONINI <0.30  --  <0.30 <0.30 <0.30  --   --   --    BNP (last 3 results)  Recent Labs  11/29/13 1500  PROBNP 2846.0*   CBG:  Recent Labs Lab 03/05/14 1716 03/05/14 1921 03/05/14 2308 03/06/14 0319 03/06/14 0723  GLUCAP 166* 118* 108* 85 89    Recent Results (from the past 240 hour(s))  CULTURE, BLOOD (ROUTINE X 2)     Status: None   Collection Time    02/27/2014  5:11 PM      Result Value Ref Range Status   Specimen Description BLOOD RIGHT ANTECUBITAL   Final   Special Requests BOTTLES DRAWN AEROBIC AND ANAEROBIC 10CC EA   Final   Culture  Setup Time     Final   Value: 03/03/2014 01:49     Performed at [facility]   Culture     Final   Value:        BLOOD CULTURE RECEIVED NO GROWTH TO DATE CULTURE WILL BE HELD FOR 5 DAYS BEFORE ISSUING A FINAL NEGATIVE REPORT     Performed at [facility]   Report Status PENDING   Incomplete  CULTURE, BLOOD (ROUTINE X 2)     Status: None   Collection Time    02/14/2014  5:21 PM      Result Value Ref Range Status   Specimen Description BLOOD LEFT ANTECUBITAL   Final   Special Requests BOTTLES DRAWN AEROBIC ONLY 5CC   Final   Culture  Setup Time     Final   Value: 03/03/2014 01:49     Performed at [facility]   Culture     Final   Value:        BLOOD CULTURE RECEIVED NO GROWTH TO DATE CULTURE WILL BE HELD FOR 5 DAYS BEFORE ISSUING A FINAL NEGATIVE REPORT     Performed at [facility]   Report Status PENDING   Incomplete  URINE CULTURE     Status: None   Collection Time    02/15/2014  5:38 PM      Result Value Ref Range Status   Specimen Description URINE, CLEAN CATCH   Final   Special Requests NONE   Final   Culture  Setup Time      Final   Value: 03/03/2014 02:19  Performed at [facility]     Final   Value: NO GROWTH     Performed at [facility]   Culture     Final   Value: NO GROWTH     Performed at [facility]   Report Status 03/04/2014 FINAL   Final  URINE CULTURE     Status: None   Collection Time    03/01/2014 11:34 PM      Result Value Ref Range Status   Specimen Description URINE, CATHETERIZED   Final   Special Requests NONE   Final   Culture  Setup Time     Final   Value: 02/27/2014 23:55     Performed at [place]     Final   Value: NO GROWTH     Performed at [facility]   Culture     Final   Value: NO GROWTH     Performed at [facility]   Report Status 03/04/2014 FINAL   Final  MRSA PCR SCREENING     Status: None   Collection Time    03/03/14  5:24 AM      Result Value Ref Range Status   MRSA by PCR NEGATIVE  NEGATIVE Final   Comment:            The GeneXpert MRSA Assay (FDA     approved for NASAL specimens     only), is one component of a     comprehensive MRSA colonization     surveillance program. It is not     intended to diagnose MRSA     infection nor to guide or     monitor treatment for     MRSA infections.     Studies:  Recent x-ray studies have been reviewed in detail by the Attending Physician       Erin Hearing, ANP [facility] Office  [phone] Pager [phone]   **If unable to reach the above provider after paging please contact the [place] @ [phone]  On-Call/Text Page:      [email or web address]      password [email or web address]  If 7PM-7AM, please contact night-coverage [email or web address] Password [email or web address] 03/06/2014, 12:35 PM   LOS: 4 days    Examination patient and discussed assessment and plan with ANP Ebony Hail and agree with the above plan. Patient with multiple complex medical problems direct patient care> 45 minutes

## 2014-03-07 ENCOUNTER — Inpatient Hospital Stay (HOSPITAL_COMMUNITY): Payer: PRIVATE HEALTH INSURANCE

## 2014-03-07 DIAGNOSIS — Z515 Encounter for palliative care: Secondary | ICD-10-CM

## 2014-03-07 DIAGNOSIS — Z66 Do not resuscitate: Secondary | ICD-10-CM

## 2014-03-07 DIAGNOSIS — R509 Fever, unspecified: Secondary | ICD-10-CM

## 2014-03-07 LAB — GLUCOSE, CAPILLARY
Glucose-Capillary: 155 mg/dL — ABNORMAL HIGH (ref 70–99)
Glucose-Capillary: 181 mg/dL — ABNORMAL HIGH (ref 70–99)
Glucose-Capillary: 192 mg/dL — ABNORMAL HIGH (ref 70–99)
Glucose-Capillary: 209 mg/dL — ABNORMAL HIGH (ref 70–99)
Glucose-Capillary: 217 mg/dL — ABNORMAL HIGH (ref 70–99)
Glucose-Capillary: 64 mg/dL — ABNORMAL LOW (ref 70–99)
Glucose-Capillary: 73 mg/dL (ref 70–99)

## 2014-03-07 LAB — COMPREHENSIVE METABOLIC PANEL
ALT: 719 U/L — ABNORMAL HIGH (ref 0–53)
AST: 1246 U/L — ABNORMAL HIGH (ref 0–37)
Albumin: 2.1 g/dL — ABNORMAL LOW (ref 3.5–5.2)
Alkaline Phosphatase: 162 U/L — ABNORMAL HIGH (ref 39–117)
Anion gap: 20 — ABNORMAL HIGH (ref 5–15)
BILIRUBIN TOTAL: 7.2 mg/dL — AB (ref 0.3–1.2)
BUN: 53 mg/dL — AB (ref 6–23)
CALCIUM: 7.4 mg/dL — AB (ref 8.4–10.5)
CHLORIDE: 108 meq/L (ref 96–112)
CO2: 25 meq/L (ref 19–32)
Creatinine, Ser: 3.62 mg/dL — ABNORMAL HIGH (ref 0.50–1.35)
GFR calc Af Amer: 17 mL/min — ABNORMAL LOW (ref >90)
GFR, EST NON AFRICAN AMERICAN: 15 mL/min — AB (ref >90)
Glucose, Bld: 205 mg/dL — ABNORMAL HIGH (ref 70–99)
Potassium: 3.7 mEq/L (ref 3.7–5.3)
Sodium: 153 mEq/L — ABNORMAL HIGH (ref 137–147)
Total Protein: 6.1 g/dL (ref 6.0–8.3)

## 2014-03-07 LAB — CBC WITH DIFFERENTIAL/PLATELET
BASOS PCT: 0 % (ref 0–1)
Basophils Absolute: 0 10*3/uL (ref 0.0–0.1)
EOS PCT: 0 % (ref 0–5)
Eosinophils Absolute: 0 10*3/uL (ref 0.0–0.7)
HEMATOCRIT: 38.9 % — AB (ref 39.0–52.0)
Hemoglobin: 12.8 g/dL — ABNORMAL LOW (ref 13.0–17.0)
LYMPHS ABS: 2.1 10*3/uL (ref 0.7–4.0)
LYMPHS PCT: 12 % (ref 12–46)
MCH: 26.8 pg (ref 26.0–34.0)
MCHC: 32.9 g/dL (ref 30.0–36.0)
MCV: 81.6 fL (ref 78.0–100.0)
Monocytes Absolute: 1.8 10*3/uL — ABNORMAL HIGH (ref 0.1–1.0)
Monocytes Relative: 8 % (ref 3–12)
NEUTROS PCT: 80 % — AB (ref 43–77)
Neutro Abs: 14.8 10*3/uL — ABNORMAL HIGH (ref 1.7–7.7)
PLATELETS: 109 10*3/uL — AB (ref 150–400)
RBC: 4.77 MIL/uL (ref 4.22–5.81)
RDW: 17.6 % — AB (ref 11.5–15.5)
WBC: 18.7 10*3/uL — ABNORMAL HIGH (ref 4.0–10.5)

## 2014-03-07 LAB — CK
Total CK: 10860 U/L — ABNORMAL HIGH (ref 7–232)
Total CK: 12323 U/L — ABNORMAL HIGH (ref 7–232)

## 2014-03-07 LAB — MAGNESIUM: Magnesium: 2.8 mg/dL — ABNORMAL HIGH (ref 1.5–2.5)

## 2014-03-07 LAB — ANTI-MICROSOMAL ANTIBODY LIVER / KIDNEY

## 2014-03-07 MED ORDER — MORPHINE SULFATE 2 MG/ML IJ SOLN
1.0000 mg | INTRAMUSCULAR | Status: DC | PRN
  Administered 2014-03-07 – 2014-03-09 (×4): 2 mg via INTRAVENOUS
  Filled 2014-03-07 (×6): qty 1

## 2014-03-07 MED ORDER — LINEZOLID 2 MG/ML IV SOLN
600.0000 mg | Freq: Two times a day (BID) | INTRAVENOUS | Status: DC
  Administered 2014-03-07: 600 mg via INTRAVENOUS
  Filled 2014-03-07 (×2): qty 300

## 2014-03-07 MED ORDER — HEPARIN SODIUM (PORCINE) 5000 UNIT/ML IJ SOLN
5000.0000 [IU] | Freq: Three times a day (TID) | INTRAMUSCULAR | Status: DC
  Filled 2014-03-07 (×2): qty 1

## 2014-03-07 MED ORDER — LORAZEPAM 2 MG/ML IJ SOLN: 0.5000 mg | INTRAMUSCULAR | Status: DC | PRN

## 2014-03-07 MED ORDER — DOPAMINE-DEXTROSE 3.2-5 MG/ML-% IV SOLN
INTRAVENOUS | Status: AC
Start: 2014-03-07 — End: 2014-03-08
  Filled 2014-03-07: qty 250

## 2014-03-07 MED ORDER — FUROSEMIDE 10 MG/ML IJ SOLN
40.0000 mg | Freq: Once | INTRAMUSCULAR | Status: AC
  Administered 2014-03-07: 40 mg via INTRAVENOUS
  Filled 2014-03-07: qty 4

## 2014-03-07 MED ORDER — SCOPOLAMINE 1 MG/3DAYS TD PT72
1.0000 | MEDICATED_PATCH | TRANSDERMAL | Status: DC
  Administered 2014-03-07: 1.5 mg via TRANSDERMAL
  Filled 2014-03-07: qty 1

## 2014-03-07 MED ORDER — DOPAMINE-DEXTROSE 3.2-5 MG/ML-% IV SOLN
2.5000 ug/kg/min | INTRAVENOUS | Status: DC
  Administered 2014-03-07: 2.5 ug/kg/min via INTRAVENOUS

## 2014-03-07 NOTE — Progress Notes (Signed)
[facility] TEAM 1 - Stepdown/ICU TEAM Progress Note  Knowledge Escandon ZOX:[id number] DOB: 1936/03/12 DOA: 02/05/2014 PCP: Cyndee Brightly, MD  Admit HPI / Brief Narrative: 78 y.o. HM PMHx ischemic cardiomyopathy S./P. ICD, history CVA, diabetes type 2 with peripheral circulatory disorder uncontrolled, HTN, dyslipidemia.  brought in from NH at request of family extensive PMH as noted below. Patient has had a 2 week slow progressive but continuous mental decline. He is normally awake, talking and "lucid." He is now to the point where he is non-verbal for them (or at least difficult to understand). They state he has been being treated for an "infection" at the NH, but it not clear from records where the infection is or what ABx are being used. He reportedly was complaining of abdominal tenderness at the time of arrival.  He had a recent admission in March after he was found to have a left ICA stenosis and had stent assisted angioplasty during that admit.  Work up in the ED showed numerous abnormalities including urinary retention, foley catheter was placed and 1.1L drained.   HPI/Subjective: 7/3 with stimuli patient will open her eyes and nod yes/no appropriately to questions. ADDENDUM;   Assessment/Plan: Severe sepsis with septic shock  -source; continues to remain on clear. Infectious disease consulted on 7/2 and agrees with workup to date. Only unexplored area of possible infection would be endocarditis. Unfortunately patient would not tolerate a TEE.  -Urine/blood cultures have been negative to date. CXR nondiagnostic.   -On admission patient met the following criteria: hypothermia, hypotension,elevated lactic acid level, ARF, tachypnea, leukocytosis and hypoglycemia, however initially improved mildly after gentle hydration.  -7/3 overnight patient febrile, increasing leukocytosis after antibiotics changed per ID, and  worsening hypotension.   -Patient now fluid overloaded, with rhonchi on  exam diffusely. Case over with Dr. Roselie Awkward [facility] M.) will start dopamine 2.5 mcg/kilogram/minute non-titrated. -Monica (daughter/HCPOA) was contacted by Dr. Roselie Awkward (PCCM.) And plans to visit today to rediscuss POC. -Per ID DC vancomycin start linezolid (worsening renal function), continue cefepime -Worsening leukocytosis, worsening CPK (unexplained source), worsening liver enzymes.  -Early Fornier's (? Focal edema left scrotum) unlikely but will keep in the differential -7/3 After family meeting with wife, and daughters all agreed that patient should be made comfort care, -DC all antibiotics   Shock liver  -at admission CT abd and abd [place] without evidence of cholelithiasis or acute cholecystitis  -Stable liver enzymes, but severely elevated. most likely secondary to sepsis and increasing hypotension. LFTs continue to trend up and now TB 4.5 which is nearly double compared to 6/30  -Increasing CK, isoenzymes pending -Acute hepatitis panel/Anti- LKM normal -Hit panel pending  -7/3 COMFORT CARE; [place] lab draws   Elevated CPK  -CK trending up, no identified source.  -TSH normal-ESR 5  -pt has abrasion on left thigh but CT c/w cellulitis and not myositis or necrotizing fasciitis  -radiologist question edema of left scrotum which could be marker of early Fornier's  (unlikely) will monitor closely  -7/3 COMFORT CARE; [place] lab draws  Metabolic encephalopathy  -Patient improved from admission but continues to be extremely lethargic and incapable of speech. Does answer courses with node of his headi -will need SLP eval before allow diet  -7/3 COMFORT CARE; [place] lab draws  Acute respiratory failure with hypoxia  -SpO2 remains physiologic, continue to monitor closely -Obtain PCXR secondary to worsening rhonchi (most likely fluid overload) -7/3 PCXR -7/3 COMFORT CARE; [place] lab draws  Acute urinary retention  -Output  overnight dropped off to a total of 56m, with worsening renal  failure (creatinine = 3.62). Most likely secondary to patient's worsening sepsis/hypotension  -7/3 COMFORT CARE; [place]  Acute-on-chronic kidney injury/metabolic acidosis/hypernatremia  -worsening rhabdo, total CK now= 10,860   -worsening BUN and Scr  -7/3 COMFORT CARE; [place] lab draws  CAD (coronary artery disease)  -enzymes negative x 3 sets  -7/3 COMFORT CARE; [place]  Cardiomyopathy, ischemic  -EF 15-20%  -decompensated, fluid overload -7/3 COMFORT CARE; [facility] draws  Hypernatremia  -not improving, have DC'd IV fluids secondary to worsening volume status   --7/3 COMFORT CARE; [place]   Hypokalemia  -potassium goal> 4  -7/3 COMFORT CARE; [facility] draws   Hypomagnesemia -Magnesium goal> 2 -7/3 COMFORT CARE; [facility]   Hypoglycemia  -Dextrose 50% IV PRN for CBG< 60  -7/3 COMFORT CARE; [place] [place]   Type II or unspecified type diabetes mellitus with peripheral circulatory disorders  -Continue PRN D50%  -7/3 COMFORT CARE; [place]   Essential hypertension, benign/sinus tachycardia with PVC  -DC low-dose metoprolol 2.5 mg q4hr  secondary to hypotension Will except tachycardia  for now.  -Monica aware that this is a very tricky situation with patient only now having adequate BP however necessary secondary to his extremely poor cardiac function  -7/3 COMFORT CARE; [place] [place]  History of CVA (cerebrovascular accident)  -once awake ck swallowing -7/3 COMFORT CARE; [place] [place]   Code Status: FULL Family Communication: no family present at time of exam Disposition Plan: ??    Consultants: Dr. RScharlene Gloss(infectious disease) Dr. BRoselie Awkward(PCCM.)  Procedure/Significant Events:      Culture 6/28 blood right/left antecubital NGTD  6/28 urine x2 negative  6/29 MRSA by PCR negative   Antibiotics: Zosyn 6/28 >>> stopped 7/2;  Vancomycin 6/28 >>> stopped 7/2 Cefepime 7/2>> linezolid 7/3>>   DVT  prophylaxis:  subcutaneous heparin   Devices ICD   LINES / TUBES:  7/2 22ga right wrist    Continuous Infusions: . dextrose 50 mL/hr at 03/06/14 1900  . dextrose    . DOPamine      Objective: VITAL SIGNS: Temp: 97.1 F (36.2 C) (07/03 1515) Temp src: Axillary (07/03 1515) BP: 107/48 mmHg (07/03 0800) Pulse Rate: 90 (07/03 0410) SPO2; 93% on nonrebreather FIO2:   Intake/Output Summary (Last 24 hours) at 03/07/14 1518 Last data filed at 03/07/14 1515  Gross per 24 hour  Intake   1450 ml  Output    295 ml  Net   1155 ml     Exam: General: Somnolent, wakes to painful stimulation, answered simple yes/no question with nods of his head, Moves extremities spontaneously, increased work of breathing   Lungs: Tachypnea, diffuse rhonchi,  without wheezes or crackles,  Cardiovascular:Tachycardic, Regular rhythm without murmur gallop or rub normal S1 and S2, no peripheral edema or JVD  Abdomen: Nontender, nondistended, soft, bowel sounds positive, no rebound, no ascites, no appreciable mass  Musculoskeletal: No significant cyanosis, Rt AKA, Lt Thigh Erythematous w/abrasion- not draining but skin with cellulitic changes surrounding the entire abrasion as well as multiple scattered (sattelite) irregular areas of redness around the rest of the leg  Data Reviewed: Basic Metabolic Panel:  Recent Labs Lab 02/09/2014 1721  03/03/14 1500 03/03/14 1532 03/04/14 0230 03/05/14 0328 03/06/14 0230 03/07/14 0315  NA 142  < > 145  --  150* 154* 156* 153*  K 3.9  < > 3.8  --  3.3* 3.3* 3.8 3.7  CL 104  < > 115*  --  117* 116* 112 108  CO2 23  < > 17*  --  17* _0 GLUCOSE 175*  < > 138*  --  75 55* 101* 205*  BUN 49*  < > 41*  --  39* 35* 37* 53*  CREATININE 2.43*  < > 2.23*  --  2.33* 2.42* 2.80* 3.62*  CALCIUM 8.1*  < > 7.2*  --  7.5* 7.6* 7.7* 7.4*  MG 2.8*  --   --  2.5  --  2.4 2.4 2.8*  < > = values in this interval not displayed. Liver Function Tests:  Recent  Labs Lab 03/03/14 0420 03/04/14 0230 03/05/14 0328 03/06/14 0230 03/07/14 0315  AST 1242* 1287* 1373* 1294* 1246*  ALT 894* 855* 879* 797* 719*  ALKPHOS 171* 181* 195* 169* 162*  BILITOT 1.9* 2.5* 4.5* 6.0* 7.2*  PROT 6.4 6.0 6.1 6.3 6.1  ALBUMIN 2.6* 2.3* 2.3* 2.3* 2.1*    Recent Labs Lab 02/20/2014 1721  LIPASE 118*    Recent Labs Lab 03/03/14 2359 03/05/14 0910  AMMONIA 26 35   CBC:  Recent Labs Lab 02/15/2014 1721 03/03/14 0420 03/04/14 0230 03/05/14 0328 03/06/14 0230 03/07/14 0315  WBC 12.3* 18.1* 16.6* 16.9* 17.5* 18.7*  NEUTROABS 9.7*  --   --  13.6* 12.6* 14.8*  HGB 13.0 12.2* 12.7* 13.1 12.9* 12.8*  HCT 39.9 37.6* 40.0 40.7 38.2* 38.9*  MCV 84.2 84.3 84.7 85.1 81.8 81.6  PLT 145* 139* 145* 118* 113* 109*   Cardiac Enzymes:  Recent Labs Lab 02/10/2014 1721  03/03/14 0420 03/03/14 0720 03/03/14 1500 03/04/14 0230 03/05/14 0328 03/06/14 0230 03/06/14 1500 03/07/14 0315  CKTOTAL  --   < >  --   --   --  571* 641* 3639* 7372* 10860*  TROPONINI <0.30  --  <0.30 <0.30 <0.30  --   --   --   --   --   < > = values in this interval not displayed. BNP (last 3 results)  Recent Labs  11/29/13 1500  PROBNP 2846.0*   CBG:  Recent Labs Lab 03/06/14 2207 03/07/14 0022 03/07/14 0444 03/07/14 0820 03/07/14 1248  GLUCAP 181* 155* 217* 192* 209*    Recent Results (from the past 240 hour(s))  CULTURE, BLOOD (ROUTINE X 2)     Status: None   Collection Time    02/22/2014  5:11 PM      Result Value Ref Range Status   Specimen Description BLOOD RIGHT ANTECUBITAL   Final   Special Requests BOTTLES DRAWN AEROBIC AND ANAEROBIC 10CC EA   Final   Culture  Setup Time     Final   Value: 03/03/2014 01:49     Performed at [facility]   Culture     Final   Value:        BLOOD CULTURE RECEIVED NO GROWTH TO DATE CULTURE WILL BE HELD FOR 5 DAYS BEFORE ISSUING A FINAL NEGATIVE REPORT     Performed at [facility]   Report Status PENDING    Incomplete  CULTURE, BLOOD (ROUTINE X 2)     Status: None   Collection Time    02/12/2014  5:21 PM      Result Value Ref Range Status   Specimen Description BLOOD LEFT ANTECUBITAL   Final   Special Requests BOTTLES DRAWN AEROBIC ONLY 5CC   Final   Culture  Setup Time  Final   Value: 03/03/2014 01:49     Performed at [facility]   Culture     Final   Value:        BLOOD CULTURE RECEIVED NO GROWTH TO DATE CULTURE WILL BE HELD FOR 5 DAYS BEFORE ISSUING A FINAL NEGATIVE REPORT     Performed at [facility]   Report Status PENDING   Incomplete  URINE CULTURE     Status: None   Collection Time    02/03/2014  5:38 PM      Result Value Ref Range Status   Specimen Description URINE, CLEAN CATCH   Final   Special Requests NONE   Final   Culture  Setup Time     Final   Value: 03/03/2014 02:19     Performed at [place]     Final   Value: NO GROWTH     Performed at [facility]   Culture     Final   Value: NO GROWTH     Performed at [facility]   Report Status 03/04/2014 FINAL   Final  URINE CULTURE     Status: None   Collection Time    03/03/2014 11:34 PM      Result Value Ref Range Status   Specimen Description URINE, CATHETERIZED   Final   Special Requests NONE   Final   Culture  Setup Time     Final   Value: 02/27/2014 23:55     Performed at [place]     Final   Value: NO GROWTH     Performed at [facility]   Culture     Final   Value: NO GROWTH     Performed at [facility]   Report Status 03/04/2014 FINAL   Final  MRSA PCR SCREENING     Status: None   Collection Time    03/03/14  5:24 AM      Result Value Ref Range Status   MRSA by PCR NEGATIVE  NEGATIVE Final   Comment:            The GeneXpert MRSA Assay (FDA     approved for NASAL specimens     only), is one component of a     comprehensive MRSA colonization     surveillance program. It is not     intended to  diagnose MRSA     infection nor to guide or     monitor treatment for     MRSA infections.     Studies:  Recent x-ray studies have been reviewed in detail by the Attending Physician  Scheduled Meds:  Scheduled Meds: . antiseptic oral rinse  15 mL Mouth Rinse q12n4p  . ceFEPime (MAXIPIME) IV  1 g Intravenous Q24H  . chlorhexidine  15 mL Mouth Rinse BID  . DOPamine      . linezolid  600 mg Intravenous Q12H  . sodium chloride  3 mL Intravenous Q12H    Time spent on care of this patient: 40 mins   Allie Bossier , MD   [facility] Office  [phone] Pager - [phone]  On-Call/Text Page:      [email or web address]      password [email or web address]  If 7PM-7AM, please contact night-coverage [email or web address] Password [email or web address] 03/07/2014, 3:18 PM   LOS: 5 days

## 2014-03-07 NOTE — Consult Note (Signed)
Name: Francisco Brock MRN: [id number] DOB: 1936-03-05    ADMISSION DATE:  02/24/2014 CONSULTATION DATE:  03/07/14  REFERRING MD :  Dr. Joseph Art PRIMARY SERVICE:  TRH   CHIEF COMPLAINT:  Hypotension, MODS  BRIEF PATIENT DESCRIPTION: 78 y/o M, SNF resident with complex medical history admitted from SNF on 6/28 with altered mental status.  Treated as potential sepsis (source unidentified).  Patient has had continued decline with MODS and PCCM consulted for hypotension.  DNR/DNI.    SIGNIFICANT EVENTS:  3/26-4/2  Admit for acute decompensation of CHF / ICM.  L ICA stenosis with stent placement. Pseudomonal UTI.  Discharged to SNF ......................................... 6/29 - Admit for AMS from SNF.  ER w/u noted urinary retention with 1.1 L urine drained with foley insertion  STUDIES:  3/30 ECHO >> EF 15-20%, severe hypokinesis of antero/inferolateral myocardium, Grade 2 diastolic dysfunction, AV / MV with mild regurgitation, PA peak 35 ......................................... 6/28 - CT Head >> no acute intracranial process, chronic small vessel change 6/29 - CT Abd/Pelvis >> no acute findings, small bilateral effusions 6/29 - [place] ABD >> gallbladder sludge, no cholelithiasis, medical renal disease, bilateral effusions 7/02 - CT Femur / L Knee >> SQ edema L thigh, no gas tracking or abscess, no osteo.  Scrotal swelling, no stranding  LINES / TUBES:  CULTURES: UC 6/28 >> neg BCx2 6/28 >>  BCx2 7/2 >>   ANTIBIOTICS: Zosyn 6/29 >>>7/2 Ancef 7/2 >>> Cefepime 7/2 >>> Vanco 6/28 >>>  HISTORY OF PRESENT ILLNESS:  78 y/o M, SNF resident with a PMH of HTN, HLD, COPD, CVA ('98), TBI (2011), DM with PVD s/p bilateral AKA, CAD s/p CABG ('98), ICM s/p ICD / Chronic sCHF with EF of 15-20%, recent prolonged admission from 3/26-4/2 for acute decompensation of CHF / ICM,  L ICA stenosis with stent placement & Pseudomonal UTI.  Patient was discharged to SNF.  Patient presented to the [facility] ER from SNF on  6/28 with altered mental status & reported "2 week decline".   Initial work up noted patient to have AMS in the setting of sepsis of unclear etiology & associated hypotension, elevated LFT's, and acute kidney injury.  Patient was treated as potential sepsis during admission with IVF & abx. Urology was consulted on admission for difficult to place foley catheter requiring a coude (noted dense urethral stricture).  1.1 L of urine returned post placement.  Given elevated LFT's, CT of abd/pelvis & ABD [place] assessed which were negative.  Patient continued to have hypotension despite volume resuscitation.  Lactic acid range from admit 2.56 ->2.1.  He continued to decline with MODS and PCCM consulted for hypotension.  DNR/DNI but family open to pressors.     PAST MEDICAL HISTORY :  Past Medical History  Diagnosis Date  . CAD (coronary artery disease)     a. s/p CABG 1998.  . Subdural hematoma     a. TBI 2011.  . 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 Cardiac defibrillator in place   . Chronic systolic CHF (congestive heart failure)     a. ICM.  Marland Kitchen Diabetes mellitus without complication   . Hypertension   . Hyperlipemia   . Peripheral vascular disease   . COPD (chronic obstructive pulmonary disease)   . VT (ventricular tachycardia)     a. s/p Medtronic ICD 2007, nearing ERI but patient had declined ERI changeout.  . Stroke 1998    no residual  .  Automatic implantable cardioverter-defibrillator in situ   . Seizures     last one maybe 2012  . History of blood transfusion   . DVT (deep venous thrombosis)   . 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 Leg amputation above knee      right  . Leg amputation below knee      left  . Coronary artery bypass graft  2004  . Cardiac defibrillator placement  2007  . Balloon angioplasty, artery      rt lower leg  . Carotid endarterectomy      bilateral  . Amputation   06/28/2012    Procedure: AMPUTATION DIGIT;  Surgeon: Tami RibasKevin R Kuzma, MD;  [facility];  Service: Orthopedics;  Laterality: Left;  left  distal index finger  . Eye surgery Bilateral   . Radiology with anesthesia N/A 11/28/2013    Procedure: ANGIO WITH CAROTID STENT ;  Surgeon: Oneal GroutSanjeev K Deveshwar, MD;  Location: [facility] OR;  Service: Radiology;  Laterality: N/A;   Prior to Admission medications   Medication Sig Start Date End Date Taking? Authorizing Provider  aspirin 81 MG chewable tablet Chew 81 mg by mouth daily.   Yes Historical Provider, MD  bisacodyl (DULCOLAX) 10 MG suppository Place 10 mg rectally daily as needed for mild constipation or moderate constipation.   Yes Historical Provider, MD  carvedilol (COREG) 3.125 MG tablet Take 1 tablet (3.125 mg total) by mouth 2 (two) times daily with a meal. 12/05/13  Yes Calvert CantorSaima Rizwan, MD  Cholecalciferol (VITAMIN D3) 50000 UNITS CAPS Take 50,000 Units by mouth every 30 (thirty) days. On the 15th of every month   Yes Historical Provider, MD  clopidogrel (PLAVIX) 75 MG tablet Take 75 mg by mouth daily.   Yes Historical Provider, MD  enalapril (VASOTEC) 5 MG tablet Take 5 mg by mouth daily.   Yes Historical Provider, MD  escitalopram (LEXAPRO) 5 MG tablet Take 5 mg by mouth daily.   Yes Historical Provider, MD  furosemide (LASIX) 20 MG tablet Take 20 mg by mouth daily.  12/20/13  Yes Calvert CantorSaima Rizwan, MD  glipiZIDE (GLUCOTROL XL) 5 MG 24 hr tablet Take 5 mg by mouth daily with breakfast.   Yes Historical Provider, MD  insulin aspart (NOVOLOG FLEXPEN) 100 UNIT/ML FlexPen Inject 2-10 Units into the skin 4 (four) times daily -  before meals and at bedtime. Per sliding scale   Yes Historical Provider, MD  ipratropium-albuterol (DUONEB) 0.5-2.5 (3) MG/3ML SOLN Take 3 mLs by nebulization every 6 (six) hours as needed (for wheezing or shortness of breath).   Yes Historical Provider, MD  Magnesium Hydroxide (MILK OF MAGNESIA PO) Take 30 mLs by mouth daily as needed  (constipation).   Yes Historical Provider, MD  Multiple Vitamins-Minerals (MULTIVITAMIN WITH MINERALS) tablet Take 1 tablet by mouth daily.   Yes Historical Provider, MD  nitroGLYCERIN (NITROSTAT) 0.4 MG SL tablet Place 0.4 mg under the tongue every 5 (five) minutes as needed for chest pain.   Yes Historical Provider, MD  potassium chloride (K-DUR,KLOR-CON) 10 MEQ tablet Take 10 mEq by mouth daily.   Yes Historical Provider, MD  protective barrier (RESTORE) CREA Apply 1 application topically 3 (three) times daily as needed (to perlarea once a shift and after each incontinent episode).   Yes Historical Provider, MD  ranolazine (RANEXA) 500 MG 12 hr tablet Take 500 mg by mouth 2 (two) times daily.  Yes Historical Provider, MD  rosuvastatin (CRESTOR) 20 MG tablet Take 20 mg by mouth at bedtime.   Yes Historical Provider, MD   No Known Allergies  FAMILY HISTORY:  History reviewed. No pertinent family history. SOCIAL HISTORY:  reports that he has quit smoking. His smoking use included Cigars. He does not have any smokeless tobacco history on file. He reports that he does not drink alcohol or use illicit drugs.  REVIEW OF SYSTEMS:   Constitutional: Negative for fever, chills, weight loss, malaise/fatigue and diaphoresis.  HENT: Negative for hearing loss, ear pain, nosebleeds, congestion, sore throat, neck pain, tinnitus and ear discharge.   Eyes: Negative for blurred vision, double vision, photophobia, pain, discharge and redness.  Respiratory: Negative for cough, hemoptysis, sputum production, shortness of breath, wheezing and stridor.   Cardiovascular: Negative for chest pain, palpitations, orthopnea, claudication, leg swelling and PND.  Gastrointestinal: Negative for heartburn, nausea, vomiting, abdominal pain, diarrhea, constipation, blood in stool and melena.  Genitourinary: Negative for dysuria, urgency, frequency, hematuria and flank pain.  Musculoskeletal: Negative for myalgias, back pain,  joint pain and falls.  Skin: Negative for itching and rash.  Neurological: Negative for dizziness, tingling, tremors, sensory change, speech change, focal weakness, seizures, loss of consciousness, weakness and headaches.  Endo/Heme/Allergies: Negative for environmental allergies and polydipsia. Does not bruise/bleed easily.  SUBJECTIVE:   VITAL SIGNS: Temp:  [97.3 F (36.3 C)-101.5 F (38.6 C)] 97.5 F (36.4 C) (07/03 1252) Pulse Rate:  [56-114] 90 (07/03 0410) Resp:  [26-33] 26 (07/03 0410) BP: (75-120)/(17-89) 107/48 mmHg (07/03 0800) SpO2:  [82 %-96 %] 93 % (07/03 0410) FiO2 (%):  [100 %] 100 % (07/02 2155) Weight:  [143 lb 4.8 oz (65 kg)] 143 lb 4.8 oz (65 kg) (07/03 0410)  PHYSICAL EXAMINATION: General:  Chronically ill Neuro:  Generalized weakness, opens eyes to voice but minimal verbal response  HEENT:  Mm pink/moist, jvd + Cardiovascular:  s1s2 irr, rate 90-120's Lungs:  resp's even/non-labored, lungs bilaterally with crackles lower lateral / posterior  Abdomen:  Round/soft, bsx4 active Musculoskeletal:  No acute deformities  Skin:  Warm/dry.  L thigh with abrasion & soft tissue swelling   Recent Labs Lab 03/05/14 0328 03/06/14 0230 03/07/14 0315  NA 154* 156* 153*  K 3.3* 3.8 3.7  CL 116* 112 108  CO2 23 28 25   BUN 35* 37* 53*  CREATININE 2.42* 2.80* 3.62*  GLUCOSE 55* 101* 205*    Recent Labs Lab 03/05/14 0328 03/06/14 0230 03/07/14 0315  HGB 13.1 12.9* 12.8*  HCT 40.7 38.2* 38.9*  WBC 16.9* 17.5* 18.7*  PLT 118* 113* 109*   Ct Knee Left Wo Contrast  03/06/2014   CLINICAL DATA:  Myositis versus necrotizing fasciitis.  EXAM: CT OF THE LEFT FEMUR WITHOUT CONTRAST; CT OF THE LEFT KNEE WITHOUT CONTRAST  TECHNIQUE: Contiguous axial CT images were obtained through the left femur and multiplanar reformatting applied. Contiguous axial CT images were obtained through the knee and the below the knee amputation stump, and multiplanar reformatting applied.   COMPARISON:  None.  FINDINGS: LEFT FEMUR:  No bony destructive findings characteristic of osteomyelitis. Mild spurring along the adductor tubercle. Degenerative loss of articular space in the left hip a Foley catheter is visualized in the urinary bladder.  Arterial atherosclerotic calcification noted. There is a suggestion of left scrotal swelling in the scrotal skin. No confluent edema along the left side of the perineum is observed, and only a part of the scrotum was included.  Patchy mild  subcutaneous edema tracks in the left upper leg. Mild skin thickening medially along the upper leg. No gas tracks in the soft tissues. No abscess identified.  LEFT KNEE AND BELOW THE KNEE AMPUTATION STUMP:  Small knee effusion. Fabella observed. No bony destructive findings along the terminal margins of the tibia and fibula or elsewhere in the remaining tibia and fibula.  As expected there is muscular atrophy in the remaining lower leg, with some preservation of the gastrocnemius musculature. No significant infiltration of the popliteal space. There is subcutaneous edema along the lower leg medially and laterally, which at the level of the stump is slightly confluent overlying the tip of the fibula. There is an indentation along the stump adjacent to the tip of the fibula as shown on image 104 of series 6 which could be simply from the soft tissue flap but given the subcutaneous edema, ulceration is not excluded. The spur like bony tip of the fibula is separated from the skin surface by only about 3 mm at this site.  No gas is identified within the soft tissues.  No drainable abscess.  IMPRESSION: 1. There is patchy subcutaneous edema in the left thigh and left lower leg which could potentially reflect areas of cellulitis. No gas tracks in the soft tissues and no abscess is identified. No definite muscular involvement to favor myositis/fasciitis. No bony destructive findings to favor osteomyelitis. 2. The thickness of soft  tissues overlying the spur like tip of the fibula at the stump margin is relatively thin, with some subcutaneous edema in this vicinity. Mild skin irregularity in this area is probably simply due to the flap, less likely due to skin breakdown from ulceration, although correlation with visual inspection is recommended. 3. There is swelling in the left scrotal skin. The entirety of the scrotum was not included. Correlation with physical exam assessment is recommended. We do not demonstrate a significant degree of stranding in the left perineum to favor Fourniers gangrene at this time, but we do not include the right perineum and is noted above imaging of the scrotum is limited. Observation of this region is likely warranted.   Electronically Signed   By: Herbie BaltimoreWalt  Liebkemann M.D.   On: 03/06/2014 10:54   Ct Femur Left Wo Contrast  03/06/2014   CLINICAL DATA:  Myositis versus necrotizing fasciitis.  EXAM: CT OF THE LEFT FEMUR WITHOUT CONTRAST; CT OF THE LEFT KNEE WITHOUT CONTRAST  TECHNIQUE: Contiguous axial CT images were obtained through the left femur and multiplanar reformatting applied. Contiguous axial CT images were obtained through the knee and the below the knee amputation stump, and multiplanar reformatting applied.  COMPARISON:  None.  FINDINGS: LEFT FEMUR:  No bony destructive findings characteristic of osteomyelitis. Mild spurring along the adductor tubercle. Degenerative loss of articular space in the left hip a Foley catheter is visualized in the urinary bladder.  Arterial atherosclerotic calcification noted. There is a suggestion of left scrotal swelling in the scrotal skin. No confluent edema along the left side of the perineum is observed, and only a part of the scrotum was included.  Patchy mild subcutaneous edema tracks in the left upper leg. Mild skin thickening medially along the upper leg. No gas tracks in the soft tissues. No abscess identified.  LEFT KNEE AND BELOW THE KNEE AMPUTATION STUMP:   Small knee effusion. Fabella observed. No bony destructive findings along the terminal margins of the tibia and fibula or elsewhere in the remaining tibia and fibula.  As expected there is  muscular atrophy in the remaining lower leg, with some preservation of the gastrocnemius musculature. No significant infiltration of the popliteal space. There is subcutaneous edema along the lower leg medially and laterally, which at the level of the stump is slightly confluent overlying the tip of the fibula. There is an indentation along the stump adjacent to the tip of the fibula as shown on image 104 of series 6 which could be simply from the soft tissue flap but given the subcutaneous edema, ulceration is not excluded. The spur like bony tip of the fibula is separated from the skin surface by only about 3 mm at this site.  No gas is identified within the soft tissues.  No drainable abscess.  IMPRESSION: 1. There is patchy subcutaneous edema in the left thigh and left lower leg which could potentially reflect areas of cellulitis. No gas tracks in the soft tissues and no abscess is identified. No definite muscular involvement to favor myositis/fasciitis. No bony destructive findings to favor osteomyelitis. 2. The thickness of soft tissues overlying the spur like tip of the fibula at the stump margin is relatively thin, with some subcutaneous edema in this vicinity. Mild skin irregularity in this area is probably simply due to the flap, less likely due to skin breakdown from ulceration, although correlation with visual inspection is recommended. 3. There is swelling in the left scrotal skin. The entirety of the scrotum was not included. Correlation with physical exam assessment is recommended. We do not demonstrate a significant degree of stranding in the left perineum to favor Fourniers gangrene at this time, but we do not include the right perineum and is noted above imaging of the scrotum is limited. Observation of this  region is likely warranted.   Electronically Signed   By: Herbie Baltimore M.D.   On: 03/06/2014 10:54   Dg Chest Port 1 View  03/07/2014   CLINICAL DATA:  Shortness of breath  EXAM: PORTABLE CHEST - 1 VIEW  COMPARISON:  03/04/2014  FINDINGS: There is a dual-chamber left subclavian approach ICD/pacer. Chronic cardiomegaly. Note of CABG. Interstitial coarsening appears similar to before. There is however an increasing left pleural effusion, small to moderate volume.  IMPRESSION: CHF with increasing left pleural effusion.   Electronically Signed   By: Tiburcio Pea M.D.   On: 03/07/2014 06:11    ASSESSMENT / PLAN:  Failure to Thrive - ongoing critical illness since 11/2013.  Has not returned to baseline since prior admit.   Hypotension - no clear source sepsis identified.  Rx with empiric abx.  Recent pseudomonal UTI.   CAD s/p CABG CHF / ICM - EF 15-20%, s/p ICD Metabolic Acidosis  Acute Renal Failure - mild rhabo  Urinary Retention  Difficult Foley Placement   Plan: -DNR/DNI -continue SDU status -dopamine gtt  -continue empiric abx -hold central line placement for now, attempt above via PIV -ongoing discussion with family regarding vasopressors -concerning that patient will not survive this admission   Canary Brim, NP-C [facility] Pgr: [phone] or [phone]  Attending:  I have seen and examined the patient with nurse practitioner/resident and agree with the note above.   Overall very poor prognosis with worsening sepsis of uncertain etiology, multi-organ failure which is worsening now.  Family has decided to proceed with comfort measures after lengthy discussion with [place] and Dr. Joseph Art.  Agree with comfort measures.  Heber Beulah, MD [place] [facility] Pager: [phone] Cell: [phone] If no response, call [phone]  03/07/2014, 3:05 PM

## 2014-03-07 NOTE — Progress Notes (Signed)
Admin lasix & MSO4 for WOB. Family bedside.

## 2014-03-07 NOTE — Progress Notes (Addendum)
Urine output decreasing. Dop initiated per Dr order. Pt desat, now on non-rebreather. Will continue to monitor.

## 2014-03-07 NOTE — Progress Notes (Addendum)
[facility]  Date of Admission:  Mar 08, 2014  Antibiotics: Cefepime day 2 Vancomycin restarted Ancef 1 dose  Subjective: Still with hypotension  Objective: Temp:  [97.3 F (36.3 C)-101.5 F (38.6 C)] 97.5 F (36.4 C) (07/03 1252) Pulse Rate:  [37-114] 90 (07/03 0410) Resp:  [26-33] 26 (07/03 0410) BP: (75-120)/(17-89) 107/48 mmHg (07/03 0800) SpO2:  [82 %-96 %] 93 % (07/03 0410) FiO2 (%):  [100 %] 100 % (07/02 2155) Weight:  [143 lb 4.8 oz (65 kg)] 143 lb 4.8 oz (65 kg) (07/03 0410)  General: non responsive Skin: left leg erythema same Lungs: CTA Cor: brady, no murmur Abdomen: soft, nt, nd Ext: Bilateral AKA,  GU: + scrotal edema  Lab Results Lab Results  Component Value Date   WBC 18.7* 03/07/2014   HGB 12.8* 03/07/2014   HCT 38.9* 03/07/2014   MCV 81.6 03/07/2014   PLT 109* 03/07/2014    Lab Results  Component Value Date   CREATININE 3.62* 03/07/2014   BUN 53* 03/07/2014   NA 153* 03/07/2014   K 3.7 03/07/2014   CL 108 03/07/2014   CO2 25 03/07/2014    Lab Results  Component Value Date   ALT 719* 03/07/2014   AST 1246* 03/07/2014   ALKPHOS 162* 03/07/2014   BILITOT 7.2* 03/07/2014      Microbiology: Recent Results (from the past 240 hour(s))  CULTURE, BLOOD (ROUTINE X 2)     Status: None   Collection Time    2014-03-08  5:11 PM      Result Value Ref Range Status   Specimen Description BLOOD RIGHT ANTECUBITAL   Final   Special Requests BOTTLES DRAWN AEROBIC AND ANAEROBIC 10CC EA   Final   Culture  Setup Time     Final   Value: 03/03/2014 01:49     Performed at [facility]   Culture     Final   Value:        BLOOD CULTURE RECEIVED NO GROWTH TO DATE CULTURE WILL BE HELD FOR 5 DAYS BEFORE ISSUING A FINAL NEGATIVE REPORT     Performed at [facility]   Report Status PENDING   Incomplete  CULTURE, BLOOD (ROUTINE X 2)     Status: None   Collection Time    2014-03-08  5:21 PM      Result Value Ref Range Status   Specimen Description  BLOOD LEFT ANTECUBITAL   Final   Special Requests BOTTLES DRAWN AEROBIC ONLY 5CC   Final   Culture  Setup Time     Final   Value: 03/03/2014 01:49     Performed at [facility]   Culture     Final   Value:        BLOOD CULTURE RECEIVED NO GROWTH TO DATE CULTURE WILL BE HELD FOR 5 DAYS BEFORE ISSUING A FINAL NEGATIVE REPORT     Performed at [facility]   Report Status PENDING   Incomplete  URINE CULTURE     Status: None   Collection Time    08-Mar-2014  5:38 PM      Result Value Ref Range Status   Specimen Description URINE, CLEAN CATCH   Final   Special Requests NONE   Final   Culture  Setup Time     Final   Value: 03/03/2014 02:19     Performed at [facility] Count     Final   Value: NO GROWTH  [facility]formed [facility]   Culture     Final   Value: NO GROWTH     [facility]   Report Status 03/04/2014 FINAL   Final  URINE CULTURE     Status: None   Collection Time    02/03/2014 11:34 PM      Result Value Ref Range Status   Specimen Description URINE, CATHETERIZED   Final   Special Requests NONE   Final   Culture  Setup Time     Final   Value: 02/06/2014 23:55     [facility]formed at [facility]   Colony Count     Final   Value: NO GROWTH     [facility]   Culture     Final   Value: NO GROWTH     [facility]formed at [facility]   Report Status 03/04/2014 FINAL   Final  MRSA PCR SCREENING     Status: None   Collection Time    03/03/14  5:24 AM      Result Value Ref Range Status   MRSA by PCR NEGATIVE  NEGATIVE Final   Comment:            The GeneXpert MRSA Assay (FDA     approved for NASAL specimens     only), is one component of a     comprehensive MRSA colonization     surveillance program. It is not     intended to diagnose MRSA     infection nor to guide or     monitor treatment for     MRSA infections.    Studies/Results: Ct Knee Left Wo Contrast  03/06/2014    CLINICAL DATA:  Myositis versus necrotizing fasciitis.  EXAM: CT OF THE LEFT FEMUR WITHOUT CONTRAST; CT OF THE LEFT KNEE WITHOUT CONTRAST  TECHNIQUE: Contiguous axial CT images were obtained through the left femur and multiplanar reformatting applied. Contiguous axial CT images were obtained through the knee and the below the knee amputation stump, and multiplanar reformatting applied.  COMPARISON:  None.  FINDINGS: LEFT FEMUR:  No bony destructive findings characteristic of osteomyelitis. Mild spurring along the adductor tubercle. Degenerative loss of articular space in the left hip a Foley catheter is visualized in the urinary bladder.  Arterial atherosclerotic calcification noted. There is a suggestion of left scrotal swelling in the scrotal skin. No confluent edema along the left side of the perineum is observed, and only a part of the scrotum was included.  Patchy mild subcutaneous edema tracks in the left upper leg. Mild skin thickening medially along the upper leg. No gas tracks in the soft tissues. No abscess identified.  LEFT KNEE AND BELOW THE KNEE AMPUTATION STUMP:  Small knee effusion. Fabella observed. No bony destructive findings along the terminal margins of the tibia and fibula or elsewhere in the remaining tibia and fibula.  As expected there is muscular atrophy in the remaining lower leg, with some preservation of the gastrocnemius musculature. No significant infiltration of the popliteal space. There is subcutaneous edema along the lower leg medially and laterally, which at the level of the stump is slightly confluent overlying the tip of the fibula. There is an indentation along the stump adjacent to the tip of the fibula as shown on image 104 of series 6 which could be simply from the soft tissue flap but given the subcutaneous edema, ulceration is not excluded. The spur like bony tip of the  fibula is separated from the skin surface by only about 3 mm at this site.  No gas is identified within  the soft tissues.  No drainable abscess.  IMPRESSION: 1. There is patchy subcutaneous edema in the left thigh and left lower leg which could potentially reflect areas of cellulitis. No gas tracks in the soft tissues and no abscess is identified. No definite muscular involvement to favor myositis/fasciitis. No bony destructive findings to favor osteomyelitis. 2. The thickness of soft tissues overlying the spur like tip of the fibula at the stump margin is relatively thin, with some subcutaneous edema in this vicinity. Mild skin irregularity in this area is probably simply due to the flap, less likely due to skin breakdown from ulceration, although correlation with visual inspection is recommended. 3. There is swelling in the left scrotal skin. The entirety of the scrotum was not included. Correlation with physical exam assessment is recommended. We do not demonstrate a significant degree of stranding in the left perineum to favor Fourniers gangrene at this time, but we do not include the right perineum and is noted above imaging of the scrotum is limited. Observation of this region is likely warranted.   Electronically Signed   By: Herbie Baltimore M.D.   On: 03/06/2014 10:54   Ct Femur Left Wo Contrast  03/06/2014   CLINICAL DATA:  Myositis versus necrotizing fasciitis.  EXAM: CT OF THE LEFT FEMUR WITHOUT CONTRAST; CT OF THE LEFT KNEE WITHOUT CONTRAST  TECHNIQUE: Contiguous axial CT images were obtained through the left femur and multiplanar reformatting applied. Contiguous axial CT images were obtained through the knee and the below the knee amputation stump, and multiplanar reformatting applied.  COMPARISON:  None.  FINDINGS: LEFT FEMUR:  No bony destructive findings characteristic of osteomyelitis. Mild spurring along the adductor tubercle. Degenerative loss of articular space in the left hip a Foley catheter is visualized in the urinary bladder.  Arterial atherosclerotic calcification noted. There is a  suggestion of left scrotal swelling in the scrotal skin. No confluent edema along the left side of the perineum is observed, and only a part of the scrotum was included.  Patchy mild subcutaneous edema tracks in the left upper leg. Mild skin thickening medially along the upper leg. No gas tracks in the soft tissues. No abscess identified.  LEFT KNEE AND BELOW THE KNEE AMPUTATION STUMP:  Small knee effusion. Fabella observed. No bony destructive findings along the terminal margins of the tibia and fibula or elsewhere in the remaining tibia and fibula.  As expected there is muscular atrophy in the remaining lower leg, with some preservation of the gastrocnemius musculature. No significant infiltration of the popliteal space. There is subcutaneous edema along the lower leg medially and laterally, which at the level of the stump is slightly confluent overlying the tip of the fibula. There is an indentation along the stump adjacent to the tip of the fibula as shown on image 104 of series 6 which could be simply from the soft tissue flap but given the subcutaneous edema, ulceration is not excluded. The spur like bony tip of the fibula is separated from the skin surface by only about 3 mm at this site.  No gas is identified within the soft tissues.  No drainable abscess.  IMPRESSION: 1. There is patchy subcutaneous edema in the left thigh and left lower leg which could potentially reflect areas of cellulitis. No gas tracks in the soft tissues and no abscess is identified. No definite muscular involvement  to favor myositis/fasciitis. No bony destructive findings to favor osteomyelitis. 2. The thickness of soft tissues overlying the spur like tip of the fibula at the stump margin is relatively thin, with some subcutaneous edema in this vicinity. Mild skin irregularity in this area is probably simply due to the flap, less likely due to skin breakdown from ulceration, although correlation with visual inspection is recommended.  3. There is swelling in the left scrotal skin. The entirety of the scrotum was not included. Correlation with physical exam assessment is recommended. We do not demonstrate a significant degree of stranding in the left perineum to favor Fourniers gangrene at this time, but we do not include the right perineum and is noted above imaging of the scrotum is limited. Observation of this region is likely warranted.   Electronically Signed   By: Herbie BaltimoreWalt  Liebkemann M.D.   On: 03/06/2014 10:54   Dg Chest Port 1 View  03/07/2014   CLINICAL DATA:  Shortness of breath  EXAM: PORTABLE CHEST - 1 VIEW  COMPARISON:  03/04/2014  FINDINGS: There is a dual-chamber left subclavian approach ICD/pacer. Chronic cardiomegaly. Note of CABG. Interstitial coarsening appears similar to before. There is however an increasing left pleural effusion, small to moderate volume.  IMPRESSION: CHF with increasing left pleural effusion.   Electronically Signed   By: Tiburcio PeaJonathan  Watts M.D.   On: 03/07/2014 06:11    Assessment/Plan: 1) fever and luekocytosis - no other etiology identified.  Is back on broad spectrum antibiotics with vancomycin and cefepime.  I will change his vanco to linezolid for possible pneumonia or toxin effect.  No signs of MRSA bacteremia so no indication for continued vancomycin.    Staci RighterOMER, Tajh Livsey, MD [email or web address] [phone] pager   [phone] 03/07/2014, 2:21 PM

## 2014-03-08 DIAGNOSIS — Z515 Encounter for palliative care: Secondary | ICD-10-CM

## 2014-03-08 DIAGNOSIS — Z66 Do not resuscitate: Secondary | ICD-10-CM

## 2014-03-08 DIAGNOSIS — Z95810 Presence of automatic (implantable) cardiac defibrillator: Secondary | ICD-10-CM

## 2014-03-08 NOTE — Progress Notes (Addendum)
[facility] TEAM 1 - Stepdown/ICU TEAM Progress Note  Francisco Brock WUJ:[id number] DOB: 1935-09-07 DOA: 02/16/2014 PCP: Cyndee Brightly, MD  Admit HPI / Brief Narrative: 78 y.o. HM PMHx ischemic cardiomyopathy S./P. ICD, history CVA, diabetes type 2 with peripheral circulatory disorder uncontrolled, HTN, dyslipidemia.  brought in from NH at request of family extensive PMH as noted below. Patient has had a 2 week slow progressive but continuous mental decline. He is normally awake, talking and "lucid." He is now to the point where he is non-verbal for them (or at least difficult to understand). They state he has been being treated for an "infection" at the NH, but it not clear from records where the infection is or what ABx are being used. He reportedly was complaining of abdominal tenderness at the time of arrival.  He had a recent admission in March after he was found to have a left ICA stenosis and had stent assisted angioplasty during that admit.  Work up in the ED showed numerous abnormalities including urinary retention, foley catheter was placed and 1.1L drained.   HPI/Subjective: 7/4 patient appears comfortable, does not respond, multiple family members present who stated patient overnight did not appear to be in pain or having anxiety.      Assessment/Plan: Severe sepsis with septic shock  -source; continues to remain on clear. Infectious disease consulted on 7/2 and agrees with workup to date. Only unexplored area of possible infection would be endocarditis. Possibly 2dary to Endocarditis. Unfortunately patient would not tolerate a TEE.  -Urine/blood cultures have been negative to date. CXR nondiagnostic.   -On admission patient met the following criteria: hypothermia, hypotension,elevated lactic acid level, ARF, tachypnea, leukocytosis and hypoglycemia, however initially improved mildly after gentle hydration.  -7/3 overnight patient febrile, increasing leukocytosis after  antibiotics changed per ID, and  worsening hypotension.   -Patient now fluid overloaded, with rhonchi on exam diffusely. Case over with Dr. Roselie Awkward [facility] M.) will start dopamine 2.5 mcg/kilogram/minute non-titrated. -Monica (daughter/HCPOA) was contacted by Dr. Roselie Awkward (PCCM.) And plans to visit today to rediscuss POC. -Per ID DC vancomycin start linezolid (worsening renal function), continue cefepime -Worsening leukocytosis, worsening CPK (unexplained source), worsening liver enzymes.  -Early Fornier's (? Focal edema left scrotum) unlikely but will keep in the differential -7/3 After family meeting with wife, and daughters all agreed that patient should be made comfort care, -DC all antibiotics   Shock liver  -at admission CT abd and abd [place] without evidence of cholelithiasis or acute cholecystitis  -Stable liver enzymes, but severely elevated. most likely secondary to sepsis and increasing hypotension. LFTs continue to trend up and now TB 4.5 which is nearly double compared to 6/30  -Increasing CK, isoenzymes pending -Acute hepatitis panel/Anti- LKM normal -Hit panel pending  -7/3 COMFORT CARE; [place] lab draws   Elevated CPK  -CK trending up, no identified source.  -TSH normal-ESR 5  -pt has abrasion on left thigh but CT c/w cellulitis and not myositis or necrotizing fasciitis  -radiologist question edema of left scrotum which could be marker of early Fornier's  (unlikely) will monitor closely  -7/3 COMFORT CARE; [place] lab draws  Metabolic encephalopathy  -Patient improved from admission but continues to be extremely lethargic and incapable of speech. Does answer courses with node of his headi -will need SLP eval before allow diet  -7/3 COMFORT CARE; [place] lab draws  Acute respiratory failure with hypoxia  -SpO2 remains physiologic, continue to monitor closely -Obtain PCXR secondary to worsening rhonchi (most  likely fluid overload) -7/3 PCXR -7/3 COMFORT CARE; [place] lab  draws  Acute urinary retention  -Output overnight dropped off to a total of 76m, with worsening renal failure (creatinine = 3.62). Most likely secondary to patient's worsening sepsis/hypotension  -7/3 COMFORT CARE; [facility] draws  Acute-on-chronic kidney injury/metabolic acidosis/hypernatremia  -worsening rhabdo, total CK now= 10,860   -worsening BUN and Scr  -7/3 COMFORT CARE; [facility] lab draws  CAD (coronary artery disease)  -enzymes negative x 3 sets  -7/3 COMFORT CARE; [facility]  Cardiomyopathy, ischemic  -EF 15-20%  -decompensated, fluid overload -7/3 COMFORT CARE; [facility]  Hypernatremia  -not improving, have DC'd IV fluids secondary to worsening volume status   --7/3 COMFORT CARE; [place]   Hypokalemia  -potassium goal> 4  -7/3 COMFORT CARE; [place] [place]   Hypomagnesemia -Magnesium goal> 2 -7/3 COMFORT CARE; [facility]   Hypoglycemia  -Dextrose 50% IV PRN for CBG< 60  -7/3 COMFORT CARE; [place]   Type II or unspecified type diabetes mellitus with peripheral circulatory disorders  -Continue PRN D50%  -7/3 COMFORT CARE; [place] [place]   Essential hypertension, benign/sinus tachycardia with PVC  -DC low-dose metoprolol 2.5 mg q4hr  secondary to hypotension Will except tachycardia  for now.  -Monica aware that this is a very tricky situation with patient only now having adequate BP however necessary secondary to his extremely poor cardiac function  -7/3 COMFORT CARE; [facility] draws  History of CVA (cerebrovascular accident)  -once awake ck swallowing -7/3 COMFORT CARE; [facility] draws   Code Status: FULL Family Communication: no family present at time of exam Disposition Plan: ??    Consultants: Dr. RScharlene Gloss(infectious disease) Dr. BRoselie Awkward(PCCM.)  Procedure/Significant Events:      Culture 6/28 blood right/left antecubital NGTD  6/28 urine x2 negative  6/29 MRSA by PCR negative   Antibiotics: Zosyn 6/28  >>> stopped 7/2;  Vancomycin 6/28 >>> stopped 7/2 Cefepime 7/2>> stopped 7/3 linezolid 7/3>> stopped 7/3   DVT prophylaxis: NA  Devices ICD   LINES / TUBES:  7/2 22ga right wrist    Continuous Infusions:    Objective: VITAL SIGNS: Temp: 98.6 F (37 C) (07/04 0730) Temp src: Axillary (07/04 0730) BP: 69/50 mmHg (07/04 0730) Pulse Rate: 90 (07/04 0730) SPO2; 93% on nonrebreather, 15 L/minute FIO2:   Intake/Output Summary (Last 24 hours) at 03/08/14 1811 Last data filed at 03/08/14 1600  Gross per 24 hour  Intake      0 ml  Output     15 ml  Net    -15 ml     Exam: General: Unresponsive appears to be resting comfortably  Lungs: Tachypnea, positive crackles,  Cardiovascular:Tachycardic, Regular rhythm without murmur gallop or rub normal S1 and S2, no peripheral edema or JVD  Abdomen: NA Musculoskeletal: NA   Data Reviewed: Basic Metabolic Panel:  Recent Labs Lab 02/07/2014 1721  03/03/14 1500 03/03/14 1532 03/04/14 0230 03/05/14 0328 03/06/14 0230 03/07/14 0315  NA 142  < > 145  --  150* 154* 156* 153*  K 3.9  < > 3.8  --  3.3* 3.3* 3.8 3.7  CL 104  < > 115*  --  117* 116* 112 108  CO2 23  < > 17*  --  17* 23 28 25   GLUCOSE 175*  < > 138*  --  75 55* 101* 205*  BUN 49*  < > 41*  --  39* 35*  37* 53*  CREATININE 2.43*  < > 2.23*  --  2.33* 2.42* 2.80* 3.62*  CALCIUM 8.1*  < > 7.2*  --  7.5* 7.6* 7.7* 7.4*  MG 2.8*  --   --  2.5  --  2.4 2.4 2.8*  < > = values in this interval not displayed. Liver Function Tests:  Recent Labs Lab 03/03/14 0420 03/04/14 0230 03/05/14 0328 03/06/14 0230 03/07/14 0315  AST 1242* 1287* 1373* 1294* 1246*  ALT 894* 855* 879* 797* 719*  ALKPHOS 171* 181* 195* 169* 162*  BILITOT 1.9* 2.5* 4.5* 6.0* 7.2*  PROT 6.4 6.0 6.1 6.3 6.1  ALBUMIN 2.6* 2.3* 2.3* 2.3* 2.1*    Recent Labs Lab 02/07/2014 1721  LIPASE 118*    Recent Labs Lab 03/03/14 2359 03/05/14 0910  AMMONIA 26 35   CBC:  Recent Labs Lab  02/15/2014 1721 03/03/14 0420 03/04/14 0230 03/05/14 0328 03/06/14 0230 03/07/14 0315  WBC 12.3* 18.1* 16.6* 16.9* 17.5* 18.7*  NEUTROABS 9.7*  --   --  13.6* 12.6* 14.8*  HGB 13.0 12.2* 12.7* 13.1 12.9* 12.8*  HCT 39.9 37.6* 40.0 40.7 38.2* 38.9*  MCV 84.2 84.3 84.7 85.1 81.8 81.6  PLT 145* 139* 145* 118* 113* 109*   Cardiac Enzymes:  Recent Labs Lab 02/21/2014 1721  03/03/14 0420 03/03/14 0720 03/03/14 1500  03/05/14 0328 03/06/14 0230 03/06/14 1500 03/07/14 0315 03/07/14 1522  CKTOTAL  --   < >  --   --   --   < > 641* 3639* 7372* 10860* 12323*  TROPONINI <0.30  --  <0.30 <0.30 <0.30  --   --   --   --   --   --   < > = values in this interval not displayed. BNP (last 3 results)  Recent Labs  11/29/13 1500  PROBNP 2846.0*   CBG:  Recent Labs Lab 03/06/14 2207 03/07/14 0022 03/07/14 0444 03/07/14 0820 03/07/14 1248  GLUCAP 181* 155* 217* 192* 209*    Recent Results (from the past 240 hour(s))  CULTURE, BLOOD (ROUTINE X 2)     Status: None   Collection Time    02/23/2014  5:11 PM      Result Value Ref Range Status   Specimen Description BLOOD RIGHT ANTECUBITAL   Final   Special Requests BOTTLES DRAWN AEROBIC AND ANAEROBIC 10CC EA   Final   Culture  Setup Time     Final   Value: 03/03/2014 01:49     Performed at [facility]   Culture     Final   Value:        BLOOD CULTURE RECEIVED NO GROWTH TO DATE CULTURE WILL BE HELD FOR 5 DAYS BEFORE ISSUING A FINAL NEGATIVE REPORT     Performed at [facility]   Report Status PENDING   Incomplete  CULTURE, BLOOD (ROUTINE X 2)     Status: None   Collection Time    02/11/2014  5:21 PM      Result Value Ref Range Status   Specimen Description BLOOD LEFT ANTECUBITAL   Final   Special Requests BOTTLES DRAWN AEROBIC ONLY 5CC   Final   Culture  Setup Time     Final   Value: 03/03/2014 01:49     Performed at [facility]   Culture     Final   Value:        BLOOD CULTURE RECEIVED NO GROWTH TO DATE  CULTURE WILL BE HELD FOR 5 DAYS BEFORE  ISSUING A FINAL NEGATIVE REPORT     Performed at [facility]   Report Status PENDING   Incomplete  URINE CULTURE     Status: None   Collection Time    02/07/2014  5:38 PM      Result Value Ref Range Status   Specimen Description URINE, CLEAN CATCH   Final   Special Requests NONE   Final   Culture  Setup Time     Final   Value: 03/03/2014 02:19     Performed at [place]     Final   Value: NO GROWTH     Performed at [facility]   Culture     Final   Value: NO GROWTH     Performed at [facility]   Report Status 03/04/2014 FINAL   Final  URINE CULTURE     Status: None   Collection Time    02/28/2014 11:34 PM      Result Value Ref Range Status   Specimen Description URINE, CATHETERIZED   Final   Special Requests NONE   Final   Culture  Setup Time     Final   Value: 02/07/2014 23:55     Performed at [place]     Final   Value: NO GROWTH     Performed at [facility]   Culture     Final   Value: NO GROWTH     Performed at [facility]   Report Status 03/04/2014 FINAL   Final  MRSA PCR SCREENING     Status: None   Collection Time    03/03/14  5:24 AM      Result Value Ref Range Status   MRSA by PCR NEGATIVE  NEGATIVE Final   Comment:            The GeneXpert MRSA Assay (FDA     approved for NASAL specimens     only), is one component of a     comprehensive MRSA colonization     surveillance program. It is not     intended to diagnose MRSA     infection nor to guide or     monitor treatment for     MRSA infections.  CULTURE, BLOOD (ROUTINE X 2)     Status: None   Collection Time    03/06/14  9:10 PM      Result Value Ref Range Status   Specimen Description BLOOD LEFT ARM   Final   Special Requests BOTTLES DRAWN AEROBIC ONLY 4CC   Final   Culture  Setup Time     Final   Value: 03/07/2014 00:14     Performed at [facility]    Culture     Final   Value:        BLOOD CULTURE RECEIVED NO GROWTH TO DATE CULTURE WILL BE HELD FOR 5 DAYS BEFORE ISSUING A FINAL NEGATIVE REPORT     Performed at [facility]   Report Status PENDING   Incomplete  CULTURE, BLOOD (ROUTINE X 2)     Status: None   Collection Time    03/06/14  9:20 PM      Result Value Ref Range Status   Specimen Description BLOOD RIGHT HAND   Final   Special Requests BOTTLES DRAWN AEROBIC ONLY 3CC   Final   Culture  Setup Time     Final  Value: 03/07/2014 00:15     Performed at [facility]   Culture     Final   Value:        BLOOD CULTURE RECEIVED NO GROWTH TO DATE CULTURE WILL BE HELD FOR 5 DAYS BEFORE ISSUING A FINAL NEGATIVE REPORT     Performed at [facility]   Report Status PENDING   Incomplete     Studies:  Recent x-ray studies have been reviewed in detail by the Attending Physician  Scheduled Meds:  Scheduled Meds: . antiseptic oral rinse  15 mL Mouth Rinse q12n4p  . chlorhexidine  15 mL Mouth Rinse BID  . scopolamine  1 patch Transdermal Q72H  . sodium chloride  3 mL Intravenous Q12H    Time spent on care of this patient: 40 mins   Allie Bossier , MD   [facility] Office  [phone] Pager [phone]  On-Call/Text Page:      [email or web address]      password [email or web address]  If 7PM-7AM, please contact night-coverage [email or web address] Password [email or web address] 03/08/2014, 6:11 PM   LOS: 6 days

## 2014-03-09 LAB — CULTURE, BLOOD (ROUTINE X 2)
Culture: NO GROWTH
Culture: NO GROWTH

## 2014-03-10 LAB — HEPARIN INDUCED THROMBOCYTOPENIA PNL
Heparin Induced Plt Ab: NEGATIVE
Patient O.D.: 0.126
UFH HIGH DOSE UFH H: 10 %
UFH LOW DOSE 0.5 IU/ML: 7 %
UFH Low Dose 0.1 IU/mL: 10 % Release
UFH SRA RESULT: NEGATIVE

## 2014-03-12 LAB — CK ISOENZYMES
CK MB: 0 % (ref <5)
CK MM: 100 % (ref 95–100)
CK TOTAL: 4610 U/L — AB (ref 7–232)
CK-BB: 0 %
Creatine Kinase-Total: 4610 U/L — ABNORMAL HIGH (ref 44–196)

## 2014-03-13 LAB — CULTURE, BLOOD (ROUTINE X 2)
CULTURE: NO GROWTH
Culture: NO GROWTH

## 2014-04-05 NOTE — Progress Notes (Signed)
Notified at 1458 that patient was asystole. Went to room.Francisco Brock. grandson at bedside. NO breath sounds or heart sounds auscultated. Verified by Claria Dicehristy Golden RN. Dr Joseph ArtWoods on the unit and made aware of pt death. Barbera Settersurner, Alejandra Barna B RN

## 2014-04-05 NOTE — Discharge Summary (Addendum)
Death Summary  Francisco LairBenigno Brock RUE:[id number] DOB: [birth year] DOA: 02/19/2014  PCP: Francisco Brock,Francisco GAVIN, Francisco Brock PCP/Office notified:   Admit date: 03/01/2014 Date of Death: 03/21/2014  Final Diagnoses:  Principal Problem:   Comfort measures only status Active Problems:   Dyslipidemia   Type II or unspecified type diabetes mellitus with peripheral circulatory disorders, uncontrolled(250.72)   Essential hypertension, benign   CAD (coronary artery disease)   History of CVA (cerebrovascular accident)   Cardiomyopathy, ischemic   ICD (implantable cardioverter-defibrillator) in place   Shock liver   Metabolic encephalopathy   Acute-on-chronic kidney injury   Acute urinary retention   Severe sepsis with septic shock   Acute respiratory failure with hypoxia   Metabolic acidosis   DNR (do not resuscitate)  Severe sepsis with septic shock  -Possibly 2dary to Endocarditis. Unfortunately patient would not tolerate a TEE.  -7/3 After family meeting with wife, and daughters all agreed that patient should be made comfort care, -DC all antibiotics   Shock liver  -7/3 COMFORT CARE; DC'd lab draws   Elevated CPK  -7/3 COMFORT CARE; DC'd lab draws   Metabolic encephalopathy  -7/3 COMFORT CARE; DC'd lab draws   Acute respiratory failure with hypoxia  -7/3 COMFORT CARE; DC'd lab draws   Acute urinary retention  -7/3 COMFORT CARE; DC'd lab draws   Acute-on-chronic kidney injury/metabolic acidosis/hypernatremia  -7/3 COMFORT CARE; DC'd lab draws   CAD (coronary artery disease)  -7/3 COMFORT CARE; DC'd lab draws  Cardiomyopathy, ischemic  -EF 15-20%  -7/3 COMFORT CARE; DC'd lab draws   Hypernatremia  -7/3 COMFORT CARE; DC'd lab draws   Hypokalemia  -7/3 COMFORT CARE; DC'd lab draws   Hypomagnesemia  -7/3 COMFORT CARE; DC'd lab draws   Hypoglycemia  -7/3 COMFORT CARE; DC'd lab draws   Type II or unspecified type diabetes mellitus with peripheral circulatory disorders  -7/3 COMFORT  CARE; DC'd lab draws   Essential hypertension, benign/sinus tachycardia with PVC  -7/3 COMFORT CARE; DC'd lab draws   History of CVA (cerebrovascular accident)  -7/3 COMFORT CARE; DC'd lab draws  History of present illness:   78 y.o. HM PMHx ischemic cardiomyopathy S./P. ICD, history CVA, diabetes type 2 with peripheral circulatory disorder uncontrolled, HTN, dyslipidemia.  brought in from NH at request of family extensive PMH as noted below. Patient has had a 2 week slow progressive but continuous mental decline. He is normally awake, talking and "lucid." He is now to the point where he is non-verbal for them (or at least difficult to understand). They state he has been being treated for an "infection" at the NH, but it not clear from records where the infection is or what ABx are being used. He reportedly was complaining of abdominal tenderness at the time of arrival.  He had a recent admission in March after he was found to have a left ICA stenosis and had stent assisted angioplasty during that admit.  Work up in the ED showed numerous abnormalities including urinary retention, foley catheter was placed and 1.1L drained. Per patient's hospital stay despite aggressive antibiotic treatment, fluids patient's health continued to deteriorate. On 7/3 discussed with family that secondary to his multisystem organ failure the only way to maintain his blood pressure was to initiate pressors. Patient's family was counseled that even if we maintained his pressures continue aggressive antibiotic treatment his mortality risk was extremely high. At that point the family decided that patient should be made comfort care.   Time: 1458  Signed:  Carolyne Littlesurtis Anisha Starliper,  Francisco Brock [facility] [phone]

## 2014-04-05 DEATH — deceased
